# Patient Record
Sex: Female | Born: 1995 | Race: Black or African American | Hispanic: No | Marital: Single | Smoking: Current every day smoker
Health system: Southern US, Community
[De-identification: ages and names within clinical notes are randomized; demographics above are authoritative.]

## PROBLEM LIST (undated history)

## (undated) DIAGNOSIS — D649 Anemia, unspecified: Secondary | ICD-10-CM

## (undated) DIAGNOSIS — N186 End stage renal disease: Secondary | ICD-10-CM

## (undated) HISTORY — PX: DIALYSIS/PERMA CATHETER INSERTION: CATH118288

## (undated) HISTORY — DX: End stage renal disease: N18.6

## (undated) HISTORY — PX: HERNIA REPAIR: SHX51

---
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
## 2016-10-29 DIAGNOSIS — N926 Irregular menstruation, unspecified: Secondary | ICD-10-CM | POA: Insufficient documentation

## 2017-02-18 DIAGNOSIS — N186 End stage renal disease: Secondary | ICD-10-CM | POA: Insufficient documentation

## 2017-03-02 DIAGNOSIS — I10 Essential (primary) hypertension: Secondary | ICD-10-CM | POA: Diagnosis present

## 2017-03-02 DIAGNOSIS — R110 Nausea: Secondary | ICD-10-CM | POA: Diagnosis not present

## 2017-03-02 DIAGNOSIS — N185 Chronic kidney disease, stage 5: Secondary | ICD-10-CM | POA: Diagnosis not present

## 2017-03-02 DIAGNOSIS — D638 Anemia in other chronic diseases classified elsewhere: Secondary | ICD-10-CM | POA: Diagnosis present

## 2017-03-02 DIAGNOSIS — D509 Iron deficiency anemia, unspecified: Secondary | ICD-10-CM | POA: Diagnosis not present

## 2017-03-02 DIAGNOSIS — I120 Hypertensive chronic kidney disease with stage 5 chronic kidney disease or end stage renal disease: Secondary | ICD-10-CM | POA: Diagnosis not present

## 2017-03-02 DIAGNOSIS — N057 Unspecified nephritic syndrome with diffuse crescentic glomerulonephritis: Secondary | ICD-10-CM | POA: Diagnosis present

## 2017-03-02 DIAGNOSIS — N028 Recurrent and persistent hematuria with other morphologic changes: Secondary | ICD-10-CM | POA: Diagnosis present

## 2017-03-02 DIAGNOSIS — Z87891 Personal history of nicotine dependence: Secondary | ICD-10-CM | POA: Diagnosis not present

## 2017-03-02 DIAGNOSIS — N186 End stage renal disease: Secondary | ICD-10-CM | POA: Diagnosis present

## 2017-03-02 DIAGNOSIS — E872 Acidosis: Secondary | ICD-10-CM | POA: Diagnosis not present

## 2017-03-02 DIAGNOSIS — N2581 Secondary hyperparathyroidism of renal origin: Secondary | ICD-10-CM | POA: Diagnosis present

## 2017-03-02 DIAGNOSIS — N19 Unspecified kidney failure: Secondary | ICD-10-CM | POA: Diagnosis not present

## 2017-03-02 DIAGNOSIS — E669 Obesity, unspecified: Secondary | ICD-10-CM | POA: Diagnosis present

## 2017-03-08 DIAGNOSIS — Z4902 Encounter for fitting and adjustment of peritoneal dialysis catheter: Secondary | ICD-10-CM | POA: Diagnosis not present

## 2017-03-08 DIAGNOSIS — N186 End stage renal disease: Secondary | ICD-10-CM | POA: Diagnosis not present

## 2017-03-09 DIAGNOSIS — J45909 Unspecified asthma, uncomplicated: Secondary | ICD-10-CM | POA: Diagnosis not present

## 2017-03-09 DIAGNOSIS — N19 Unspecified kidney failure: Secondary | ICD-10-CM | POA: Diagnosis not present

## 2017-03-09 DIAGNOSIS — K668 Other specified disorders of peritoneum: Secondary | ICD-10-CM | POA: Diagnosis not present

## 2017-03-09 DIAGNOSIS — Z9889 Other specified postprocedural states: Secondary | ICD-10-CM | POA: Diagnosis not present

## 2017-03-09 DIAGNOSIS — F17200 Nicotine dependence, unspecified, uncomplicated: Secondary | ICD-10-CM | POA: Diagnosis not present

## 2017-03-09 DIAGNOSIS — Z79899 Other long term (current) drug therapy: Secondary | ICD-10-CM | POA: Diagnosis not present

## 2017-03-09 DIAGNOSIS — I10 Essential (primary) hypertension: Secondary | ICD-10-CM | POA: Diagnosis not present

## 2017-03-16 DIAGNOSIS — N186 End stage renal disease: Secondary | ICD-10-CM | POA: Diagnosis not present

## 2017-03-16 DIAGNOSIS — N2581 Secondary hyperparathyroidism of renal origin: Secondary | ICD-10-CM | POA: Diagnosis not present

## 2017-03-16 DIAGNOSIS — D631 Anemia in chronic kidney disease: Secondary | ICD-10-CM | POA: Diagnosis not present

## 2017-03-16 DIAGNOSIS — D509 Iron deficiency anemia, unspecified: Secondary | ICD-10-CM | POA: Diagnosis not present

## 2017-03-21 DIAGNOSIS — N2581 Secondary hyperparathyroidism of renal origin: Secondary | ICD-10-CM | POA: Diagnosis not present

## 2017-03-21 DIAGNOSIS — D509 Iron deficiency anemia, unspecified: Secondary | ICD-10-CM | POA: Diagnosis not present

## 2017-03-21 DIAGNOSIS — N186 End stage renal disease: Secondary | ICD-10-CM | POA: Diagnosis not present

## 2017-03-21 DIAGNOSIS — D631 Anemia in chronic kidney disease: Secondary | ICD-10-CM | POA: Diagnosis not present

## 2017-03-22 DIAGNOSIS — D509 Iron deficiency anemia, unspecified: Secondary | ICD-10-CM | POA: Diagnosis not present

## 2017-03-22 DIAGNOSIS — N186 End stage renal disease: Secondary | ICD-10-CM | POA: Diagnosis not present

## 2017-03-22 DIAGNOSIS — D631 Anemia in chronic kidney disease: Secondary | ICD-10-CM | POA: Diagnosis not present

## 2017-03-22 DIAGNOSIS — N2581 Secondary hyperparathyroidism of renal origin: Secondary | ICD-10-CM | POA: Diagnosis not present

## 2017-03-23 DIAGNOSIS — N2581 Secondary hyperparathyroidism of renal origin: Secondary | ICD-10-CM | POA: Diagnosis not present

## 2017-03-23 DIAGNOSIS — D631 Anemia in chronic kidney disease: Secondary | ICD-10-CM | POA: Diagnosis not present

## 2017-03-23 DIAGNOSIS — N186 End stage renal disease: Secondary | ICD-10-CM | POA: Diagnosis not present

## 2017-03-23 DIAGNOSIS — D509 Iron deficiency anemia, unspecified: Secondary | ICD-10-CM | POA: Diagnosis not present

## 2017-03-24 DIAGNOSIS — D509 Iron deficiency anemia, unspecified: Secondary | ICD-10-CM | POA: Diagnosis not present

## 2017-03-24 DIAGNOSIS — N2581 Secondary hyperparathyroidism of renal origin: Secondary | ICD-10-CM | POA: Diagnosis not present

## 2017-03-24 DIAGNOSIS — N186 End stage renal disease: Secondary | ICD-10-CM | POA: Diagnosis not present

## 2017-03-24 DIAGNOSIS — D631 Anemia in chronic kidney disease: Secondary | ICD-10-CM | POA: Diagnosis not present

## 2017-03-25 DIAGNOSIS — N186 End stage renal disease: Secondary | ICD-10-CM | POA: Diagnosis not present

## 2017-03-26 DIAGNOSIS — N186 End stage renal disease: Secondary | ICD-10-CM | POA: Diagnosis not present

## 2017-03-27 DIAGNOSIS — N186 End stage renal disease: Secondary | ICD-10-CM | POA: Diagnosis not present

## 2017-03-28 DIAGNOSIS — N186 End stage renal disease: Secondary | ICD-10-CM | POA: Diagnosis not present

## 2017-03-29 DIAGNOSIS — N186 End stage renal disease: Secondary | ICD-10-CM | POA: Diagnosis not present

## 2017-03-29 DIAGNOSIS — D631 Anemia in chronic kidney disease: Secondary | ICD-10-CM | POA: Diagnosis not present

## 2017-03-30 DIAGNOSIS — Z01818 Encounter for other preprocedural examination: Secondary | ICD-10-CM | POA: Insufficient documentation

## 2017-03-30 DIAGNOSIS — N186 End stage renal disease: Secondary | ICD-10-CM | POA: Diagnosis not present

## 2017-03-31 DIAGNOSIS — N186 End stage renal disease: Secondary | ICD-10-CM | POA: Diagnosis not present

## 2017-04-01 DIAGNOSIS — N186 End stage renal disease: Secondary | ICD-10-CM | POA: Diagnosis not present

## 2017-04-01 DIAGNOSIS — D631 Anemia in chronic kidney disease: Secondary | ICD-10-CM | POA: Diagnosis not present

## 2017-04-01 DIAGNOSIS — N2581 Secondary hyperparathyroidism of renal origin: Secondary | ICD-10-CM | POA: Diagnosis not present

## 2017-04-01 DIAGNOSIS — D509 Iron deficiency anemia, unspecified: Secondary | ICD-10-CM | POA: Diagnosis not present

## 2017-04-02 DIAGNOSIS — N2581 Secondary hyperparathyroidism of renal origin: Secondary | ICD-10-CM | POA: Diagnosis not present

## 2017-04-02 DIAGNOSIS — N186 End stage renal disease: Secondary | ICD-10-CM | POA: Diagnosis not present

## 2017-04-02 DIAGNOSIS — D509 Iron deficiency anemia, unspecified: Secondary | ICD-10-CM | POA: Diagnosis not present

## 2017-04-02 DIAGNOSIS — D631 Anemia in chronic kidney disease: Secondary | ICD-10-CM | POA: Diagnosis not present

## 2017-04-03 DIAGNOSIS — N186 End stage renal disease: Secondary | ICD-10-CM | POA: Diagnosis not present

## 2017-04-03 DIAGNOSIS — D631 Anemia in chronic kidney disease: Secondary | ICD-10-CM | POA: Diagnosis not present

## 2017-04-03 DIAGNOSIS — D509 Iron deficiency anemia, unspecified: Secondary | ICD-10-CM | POA: Diagnosis not present

## 2017-04-03 DIAGNOSIS — N2581 Secondary hyperparathyroidism of renal origin: Secondary | ICD-10-CM | POA: Diagnosis not present

## 2017-04-04 DIAGNOSIS — D509 Iron deficiency anemia, unspecified: Secondary | ICD-10-CM | POA: Diagnosis not present

## 2017-04-04 DIAGNOSIS — D631 Anemia in chronic kidney disease: Secondary | ICD-10-CM | POA: Diagnosis not present

## 2017-04-04 DIAGNOSIS — N186 End stage renal disease: Secondary | ICD-10-CM | POA: Diagnosis not present

## 2017-04-04 DIAGNOSIS — N2581 Secondary hyperparathyroidism of renal origin: Secondary | ICD-10-CM | POA: Diagnosis not present

## 2017-04-05 DIAGNOSIS — N2581 Secondary hyperparathyroidism of renal origin: Secondary | ICD-10-CM | POA: Diagnosis not present

## 2017-04-05 DIAGNOSIS — D509 Iron deficiency anemia, unspecified: Secondary | ICD-10-CM | POA: Diagnosis not present

## 2017-04-05 DIAGNOSIS — N186 End stage renal disease: Secondary | ICD-10-CM | POA: Diagnosis not present

## 2017-04-05 DIAGNOSIS — D631 Anemia in chronic kidney disease: Secondary | ICD-10-CM | POA: Diagnosis not present

## 2017-04-06 DIAGNOSIS — D509 Iron deficiency anemia, unspecified: Secondary | ICD-10-CM | POA: Diagnosis not present

## 2017-04-06 DIAGNOSIS — D631 Anemia in chronic kidney disease: Secondary | ICD-10-CM | POA: Diagnosis not present

## 2017-04-06 DIAGNOSIS — N186 End stage renal disease: Secondary | ICD-10-CM | POA: Diagnosis not present

## 2017-04-06 DIAGNOSIS — N2581 Secondary hyperparathyroidism of renal origin: Secondary | ICD-10-CM | POA: Diagnosis not present

## 2017-04-07 DIAGNOSIS — N186 End stage renal disease: Secondary | ICD-10-CM | POA: Diagnosis not present

## 2017-04-07 DIAGNOSIS — N2581 Secondary hyperparathyroidism of renal origin: Secondary | ICD-10-CM | POA: Diagnosis not present

## 2017-04-07 DIAGNOSIS — N764 Abscess of vulva: Secondary | ICD-10-CM | POA: Diagnosis not present

## 2017-04-07 DIAGNOSIS — D509 Iron deficiency anemia, unspecified: Secondary | ICD-10-CM | POA: Diagnosis not present

## 2017-04-07 DIAGNOSIS — D631 Anemia in chronic kidney disease: Secondary | ICD-10-CM | POA: Diagnosis not present

## 2017-04-08 DIAGNOSIS — D509 Iron deficiency anemia, unspecified: Secondary | ICD-10-CM | POA: Diagnosis not present

## 2017-04-08 DIAGNOSIS — D631 Anemia in chronic kidney disease: Secondary | ICD-10-CM | POA: Diagnosis not present

## 2017-04-08 DIAGNOSIS — N186 End stage renal disease: Secondary | ICD-10-CM | POA: Diagnosis not present

## 2017-04-08 DIAGNOSIS — N2581 Secondary hyperparathyroidism of renal origin: Secondary | ICD-10-CM | POA: Diagnosis not present

## 2017-04-09 DIAGNOSIS — D509 Iron deficiency anemia, unspecified: Secondary | ICD-10-CM | POA: Diagnosis not present

## 2017-04-09 DIAGNOSIS — D631 Anemia in chronic kidney disease: Secondary | ICD-10-CM | POA: Diagnosis not present

## 2017-04-09 DIAGNOSIS — N2581 Secondary hyperparathyroidism of renal origin: Secondary | ICD-10-CM | POA: Diagnosis not present

## 2017-04-09 DIAGNOSIS — N186 End stage renal disease: Secondary | ICD-10-CM | POA: Diagnosis not present

## 2017-04-10 DIAGNOSIS — D509 Iron deficiency anemia, unspecified: Secondary | ICD-10-CM | POA: Diagnosis not present

## 2017-04-10 DIAGNOSIS — N2581 Secondary hyperparathyroidism of renal origin: Secondary | ICD-10-CM | POA: Diagnosis not present

## 2017-04-10 DIAGNOSIS — D631 Anemia in chronic kidney disease: Secondary | ICD-10-CM | POA: Diagnosis not present

## 2017-04-10 DIAGNOSIS — N186 End stage renal disease: Secondary | ICD-10-CM | POA: Diagnosis not present

## 2017-04-11 DIAGNOSIS — D509 Iron deficiency anemia, unspecified: Secondary | ICD-10-CM | POA: Diagnosis not present

## 2017-04-11 DIAGNOSIS — N186 End stage renal disease: Secondary | ICD-10-CM | POA: Diagnosis not present

## 2017-04-11 DIAGNOSIS — N2581 Secondary hyperparathyroidism of renal origin: Secondary | ICD-10-CM | POA: Diagnosis not present

## 2017-04-11 DIAGNOSIS — D631 Anemia in chronic kidney disease: Secondary | ICD-10-CM | POA: Diagnosis not present

## 2017-04-12 DIAGNOSIS — N2581 Secondary hyperparathyroidism of renal origin: Secondary | ICD-10-CM | POA: Diagnosis not present

## 2017-04-12 DIAGNOSIS — D509 Iron deficiency anemia, unspecified: Secondary | ICD-10-CM | POA: Diagnosis not present

## 2017-04-12 DIAGNOSIS — N186 End stage renal disease: Secondary | ICD-10-CM | POA: Diagnosis not present

## 2017-04-12 DIAGNOSIS — D631 Anemia in chronic kidney disease: Secondary | ICD-10-CM | POA: Diagnosis not present

## 2017-04-13 DIAGNOSIS — N186 End stage renal disease: Secondary | ICD-10-CM | POA: Diagnosis not present

## 2017-04-13 DIAGNOSIS — D631 Anemia in chronic kidney disease: Secondary | ICD-10-CM | POA: Diagnosis not present

## 2017-04-13 DIAGNOSIS — D509 Iron deficiency anemia, unspecified: Secondary | ICD-10-CM | POA: Diagnosis not present

## 2017-04-13 DIAGNOSIS — N2581 Secondary hyperparathyroidism of renal origin: Secondary | ICD-10-CM | POA: Diagnosis not present

## 2017-04-14 DIAGNOSIS — D509 Iron deficiency anemia, unspecified: Secondary | ICD-10-CM | POA: Diagnosis not present

## 2017-04-14 DIAGNOSIS — N186 End stage renal disease: Secondary | ICD-10-CM | POA: Diagnosis not present

## 2017-04-14 DIAGNOSIS — D631 Anemia in chronic kidney disease: Secondary | ICD-10-CM | POA: Diagnosis not present

## 2017-04-14 DIAGNOSIS — N2581 Secondary hyperparathyroidism of renal origin: Secondary | ICD-10-CM | POA: Diagnosis not present

## 2017-04-15 DIAGNOSIS — D631 Anemia in chronic kidney disease: Secondary | ICD-10-CM | POA: Diagnosis not present

## 2017-04-15 DIAGNOSIS — N2581 Secondary hyperparathyroidism of renal origin: Secondary | ICD-10-CM | POA: Diagnosis not present

## 2017-04-15 DIAGNOSIS — N186 End stage renal disease: Secondary | ICD-10-CM | POA: Diagnosis not present

## 2017-04-15 DIAGNOSIS — D509 Iron deficiency anemia, unspecified: Secondary | ICD-10-CM | POA: Diagnosis not present

## 2017-04-16 DIAGNOSIS — D509 Iron deficiency anemia, unspecified: Secondary | ICD-10-CM | POA: Diagnosis not present

## 2017-04-16 DIAGNOSIS — N2581 Secondary hyperparathyroidism of renal origin: Secondary | ICD-10-CM | POA: Diagnosis not present

## 2017-04-16 DIAGNOSIS — N186 End stage renal disease: Secondary | ICD-10-CM | POA: Diagnosis not present

## 2017-04-16 DIAGNOSIS — D631 Anemia in chronic kidney disease: Secondary | ICD-10-CM | POA: Diagnosis not present

## 2017-04-17 DIAGNOSIS — N186 End stage renal disease: Secondary | ICD-10-CM | POA: Diagnosis not present

## 2017-04-17 DIAGNOSIS — D631 Anemia in chronic kidney disease: Secondary | ICD-10-CM | POA: Diagnosis not present

## 2017-04-17 DIAGNOSIS — D509 Iron deficiency anemia, unspecified: Secondary | ICD-10-CM | POA: Diagnosis not present

## 2017-04-17 DIAGNOSIS — N2581 Secondary hyperparathyroidism of renal origin: Secondary | ICD-10-CM | POA: Diagnosis not present

## 2017-04-18 DIAGNOSIS — D509 Iron deficiency anemia, unspecified: Secondary | ICD-10-CM | POA: Diagnosis not present

## 2017-04-18 DIAGNOSIS — D631 Anemia in chronic kidney disease: Secondary | ICD-10-CM | POA: Diagnosis not present

## 2017-04-18 DIAGNOSIS — N186 End stage renal disease: Secondary | ICD-10-CM | POA: Diagnosis not present

## 2017-04-18 DIAGNOSIS — N2581 Secondary hyperparathyroidism of renal origin: Secondary | ICD-10-CM | POA: Diagnosis not present

## 2017-04-19 DIAGNOSIS — D631 Anemia in chronic kidney disease: Secondary | ICD-10-CM | POA: Diagnosis not present

## 2017-04-19 DIAGNOSIS — N2581 Secondary hyperparathyroidism of renal origin: Secondary | ICD-10-CM | POA: Diagnosis not present

## 2017-04-19 DIAGNOSIS — D509 Iron deficiency anemia, unspecified: Secondary | ICD-10-CM | POA: Diagnosis not present

## 2017-04-19 DIAGNOSIS — N186 End stage renal disease: Secondary | ICD-10-CM | POA: Diagnosis not present

## 2017-04-20 DIAGNOSIS — D631 Anemia in chronic kidney disease: Secondary | ICD-10-CM | POA: Diagnosis not present

## 2017-04-20 DIAGNOSIS — D509 Iron deficiency anemia, unspecified: Secondary | ICD-10-CM | POA: Diagnosis not present

## 2017-04-20 DIAGNOSIS — N2581 Secondary hyperparathyroidism of renal origin: Secondary | ICD-10-CM | POA: Diagnosis not present

## 2017-04-20 DIAGNOSIS — N186 End stage renal disease: Secondary | ICD-10-CM | POA: Diagnosis not present

## 2017-04-21 DIAGNOSIS — N186 End stage renal disease: Secondary | ICD-10-CM | POA: Diagnosis not present

## 2017-04-21 DIAGNOSIS — N2581 Secondary hyperparathyroidism of renal origin: Secondary | ICD-10-CM | POA: Diagnosis not present

## 2017-04-21 DIAGNOSIS — D631 Anemia in chronic kidney disease: Secondary | ICD-10-CM | POA: Diagnosis not present

## 2017-04-21 DIAGNOSIS — D509 Iron deficiency anemia, unspecified: Secondary | ICD-10-CM | POA: Diagnosis not present

## 2017-04-22 DIAGNOSIS — N2581 Secondary hyperparathyroidism of renal origin: Secondary | ICD-10-CM | POA: Diagnosis not present

## 2017-04-22 DIAGNOSIS — D509 Iron deficiency anemia, unspecified: Secondary | ICD-10-CM | POA: Diagnosis not present

## 2017-04-22 DIAGNOSIS — N186 End stage renal disease: Secondary | ICD-10-CM | POA: Diagnosis not present

## 2017-04-22 DIAGNOSIS — D631 Anemia in chronic kidney disease: Secondary | ICD-10-CM | POA: Diagnosis not present

## 2017-04-23 DIAGNOSIS — N2581 Secondary hyperparathyroidism of renal origin: Secondary | ICD-10-CM | POA: Diagnosis not present

## 2017-04-23 DIAGNOSIS — N186 End stage renal disease: Secondary | ICD-10-CM | POA: Diagnosis not present

## 2017-04-23 DIAGNOSIS — D509 Iron deficiency anemia, unspecified: Secondary | ICD-10-CM | POA: Diagnosis not present

## 2017-04-23 DIAGNOSIS — D631 Anemia in chronic kidney disease: Secondary | ICD-10-CM | POA: Diagnosis not present

## 2017-04-24 DIAGNOSIS — D509 Iron deficiency anemia, unspecified: Secondary | ICD-10-CM | POA: Diagnosis not present

## 2017-04-24 DIAGNOSIS — N2581 Secondary hyperparathyroidism of renal origin: Secondary | ICD-10-CM | POA: Diagnosis not present

## 2017-04-24 DIAGNOSIS — N186 End stage renal disease: Secondary | ICD-10-CM | POA: Diagnosis not present

## 2017-04-24 DIAGNOSIS — D631 Anemia in chronic kidney disease: Secondary | ICD-10-CM | POA: Diagnosis not present

## 2017-04-25 DIAGNOSIS — D631 Anemia in chronic kidney disease: Secondary | ICD-10-CM | POA: Diagnosis not present

## 2017-04-25 DIAGNOSIS — N2581 Secondary hyperparathyroidism of renal origin: Secondary | ICD-10-CM | POA: Diagnosis not present

## 2017-04-25 DIAGNOSIS — N186 End stage renal disease: Secondary | ICD-10-CM | POA: Diagnosis not present

## 2017-04-25 DIAGNOSIS — D509 Iron deficiency anemia, unspecified: Secondary | ICD-10-CM | POA: Diagnosis not present

## 2017-04-26 DIAGNOSIS — D509 Iron deficiency anemia, unspecified: Secondary | ICD-10-CM | POA: Diagnosis not present

## 2017-04-26 DIAGNOSIS — D631 Anemia in chronic kidney disease: Secondary | ICD-10-CM | POA: Diagnosis not present

## 2017-04-26 DIAGNOSIS — N186 End stage renal disease: Secondary | ICD-10-CM | POA: Diagnosis not present

## 2017-04-26 DIAGNOSIS — N2581 Secondary hyperparathyroidism of renal origin: Secondary | ICD-10-CM | POA: Diagnosis not present

## 2017-04-27 DIAGNOSIS — D509 Iron deficiency anemia, unspecified: Secondary | ICD-10-CM | POA: Diagnosis not present

## 2017-04-27 DIAGNOSIS — N2581 Secondary hyperparathyroidism of renal origin: Secondary | ICD-10-CM | POA: Diagnosis not present

## 2017-04-27 DIAGNOSIS — D631 Anemia in chronic kidney disease: Secondary | ICD-10-CM | POA: Diagnosis not present

## 2017-04-27 DIAGNOSIS — N186 End stage renal disease: Secondary | ICD-10-CM | POA: Diagnosis not present

## 2017-04-28 DIAGNOSIS — D509 Iron deficiency anemia, unspecified: Secondary | ICD-10-CM | POA: Diagnosis not present

## 2017-04-28 DIAGNOSIS — Z992 Dependence on renal dialysis: Secondary | ICD-10-CM | POA: Diagnosis not present

## 2017-04-28 DIAGNOSIS — D631 Anemia in chronic kidney disease: Secondary | ICD-10-CM | POA: Diagnosis not present

## 2017-04-28 DIAGNOSIS — N186 End stage renal disease: Secondary | ICD-10-CM | POA: Diagnosis not present

## 2017-04-28 DIAGNOSIS — N2581 Secondary hyperparathyroidism of renal origin: Secondary | ICD-10-CM | POA: Diagnosis not present

## 2017-04-29 DIAGNOSIS — N2581 Secondary hyperparathyroidism of renal origin: Secondary | ICD-10-CM | POA: Diagnosis not present

## 2017-04-29 DIAGNOSIS — D509 Iron deficiency anemia, unspecified: Secondary | ICD-10-CM | POA: Diagnosis not present

## 2017-04-29 DIAGNOSIS — N186 End stage renal disease: Secondary | ICD-10-CM | POA: Diagnosis not present

## 2017-04-29 DIAGNOSIS — D631 Anemia in chronic kidney disease: Secondary | ICD-10-CM | POA: Diagnosis not present

## 2017-04-30 DIAGNOSIS — N186 End stage renal disease: Secondary | ICD-10-CM | POA: Diagnosis not present

## 2017-05-01 DIAGNOSIS — N2581 Secondary hyperparathyroidism of renal origin: Secondary | ICD-10-CM | POA: Diagnosis not present

## 2017-05-01 DIAGNOSIS — D631 Anemia in chronic kidney disease: Secondary | ICD-10-CM | POA: Diagnosis not present

## 2017-05-01 DIAGNOSIS — N186 End stage renal disease: Secondary | ICD-10-CM | POA: Diagnosis not present

## 2017-05-01 DIAGNOSIS — D509 Iron deficiency anemia, unspecified: Secondary | ICD-10-CM | POA: Diagnosis not present

## 2017-05-02 DIAGNOSIS — N2581 Secondary hyperparathyroidism of renal origin: Secondary | ICD-10-CM | POA: Diagnosis not present

## 2017-05-02 DIAGNOSIS — D509 Iron deficiency anemia, unspecified: Secondary | ICD-10-CM | POA: Diagnosis not present

## 2017-05-02 DIAGNOSIS — D631 Anemia in chronic kidney disease: Secondary | ICD-10-CM | POA: Diagnosis not present

## 2017-05-02 DIAGNOSIS — N186 End stage renal disease: Secondary | ICD-10-CM | POA: Diagnosis not present

## 2017-05-03 DIAGNOSIS — D631 Anemia in chronic kidney disease: Secondary | ICD-10-CM | POA: Diagnosis not present

## 2017-05-03 DIAGNOSIS — N186 End stage renal disease: Secondary | ICD-10-CM | POA: Diagnosis not present

## 2017-05-03 DIAGNOSIS — N2581 Secondary hyperparathyroidism of renal origin: Secondary | ICD-10-CM | POA: Diagnosis not present

## 2017-05-03 DIAGNOSIS — D509 Iron deficiency anemia, unspecified: Secondary | ICD-10-CM | POA: Diagnosis not present

## 2017-05-04 DIAGNOSIS — D631 Anemia in chronic kidney disease: Secondary | ICD-10-CM | POA: Diagnosis not present

## 2017-05-04 DIAGNOSIS — N186 End stage renal disease: Secondary | ICD-10-CM | POA: Diagnosis not present

## 2017-05-04 DIAGNOSIS — D509 Iron deficiency anemia, unspecified: Secondary | ICD-10-CM | POA: Diagnosis not present

## 2017-05-04 DIAGNOSIS — N2581 Secondary hyperparathyroidism of renal origin: Secondary | ICD-10-CM | POA: Diagnosis not present

## 2017-05-05 DIAGNOSIS — N2581 Secondary hyperparathyroidism of renal origin: Secondary | ICD-10-CM | POA: Diagnosis not present

## 2017-05-05 DIAGNOSIS — D509 Iron deficiency anemia, unspecified: Secondary | ICD-10-CM | POA: Diagnosis not present

## 2017-05-05 DIAGNOSIS — N186 End stage renal disease: Secondary | ICD-10-CM | POA: Diagnosis not present

## 2017-05-05 DIAGNOSIS — D631 Anemia in chronic kidney disease: Secondary | ICD-10-CM | POA: Diagnosis not present

## 2017-05-06 DIAGNOSIS — D631 Anemia in chronic kidney disease: Secondary | ICD-10-CM | POA: Diagnosis not present

## 2017-05-06 DIAGNOSIS — D509 Iron deficiency anemia, unspecified: Secondary | ICD-10-CM | POA: Diagnosis not present

## 2017-05-06 DIAGNOSIS — N2581 Secondary hyperparathyroidism of renal origin: Secondary | ICD-10-CM | POA: Diagnosis not present

## 2017-05-06 DIAGNOSIS — N186 End stage renal disease: Secondary | ICD-10-CM | POA: Diagnosis not present

## 2017-05-07 DIAGNOSIS — N2581 Secondary hyperparathyroidism of renal origin: Secondary | ICD-10-CM | POA: Diagnosis not present

## 2017-05-07 DIAGNOSIS — D631 Anemia in chronic kidney disease: Secondary | ICD-10-CM | POA: Diagnosis not present

## 2017-05-07 DIAGNOSIS — D509 Iron deficiency anemia, unspecified: Secondary | ICD-10-CM | POA: Diagnosis not present

## 2017-05-07 DIAGNOSIS — N186 End stage renal disease: Secondary | ICD-10-CM | POA: Diagnosis not present

## 2017-05-08 DIAGNOSIS — N186 End stage renal disease: Secondary | ICD-10-CM | POA: Diagnosis not present

## 2017-05-08 DIAGNOSIS — N2581 Secondary hyperparathyroidism of renal origin: Secondary | ICD-10-CM | POA: Diagnosis not present

## 2017-05-08 DIAGNOSIS — D631 Anemia in chronic kidney disease: Secondary | ICD-10-CM | POA: Diagnosis not present

## 2017-05-08 DIAGNOSIS — D509 Iron deficiency anemia, unspecified: Secondary | ICD-10-CM | POA: Diagnosis not present

## 2017-05-09 DIAGNOSIS — D509 Iron deficiency anemia, unspecified: Secondary | ICD-10-CM | POA: Diagnosis not present

## 2017-05-09 DIAGNOSIS — D631 Anemia in chronic kidney disease: Secondary | ICD-10-CM | POA: Diagnosis not present

## 2017-05-09 DIAGNOSIS — N2581 Secondary hyperparathyroidism of renal origin: Secondary | ICD-10-CM | POA: Diagnosis not present

## 2017-05-09 DIAGNOSIS — N186 End stage renal disease: Secondary | ICD-10-CM | POA: Diagnosis not present

## 2017-05-10 DIAGNOSIS — N186 End stage renal disease: Secondary | ICD-10-CM | POA: Diagnosis not present

## 2017-05-11 DIAGNOSIS — N186 End stage renal disease: Secondary | ICD-10-CM | POA: Diagnosis not present

## 2017-05-12 DIAGNOSIS — D509 Iron deficiency anemia, unspecified: Secondary | ICD-10-CM | POA: Diagnosis not present

## 2017-05-12 DIAGNOSIS — D631 Anemia in chronic kidney disease: Secondary | ICD-10-CM | POA: Diagnosis not present

## 2017-05-12 DIAGNOSIS — N2581 Secondary hyperparathyroidism of renal origin: Secondary | ICD-10-CM | POA: Diagnosis not present

## 2017-05-12 DIAGNOSIS — N186 End stage renal disease: Secondary | ICD-10-CM | POA: Diagnosis not present

## 2017-05-13 DIAGNOSIS — D631 Anemia in chronic kidney disease: Secondary | ICD-10-CM | POA: Diagnosis not present

## 2017-05-13 DIAGNOSIS — D509 Iron deficiency anemia, unspecified: Secondary | ICD-10-CM | POA: Diagnosis not present

## 2017-05-13 DIAGNOSIS — N186 End stage renal disease: Secondary | ICD-10-CM | POA: Diagnosis not present

## 2017-05-13 DIAGNOSIS — N2581 Secondary hyperparathyroidism of renal origin: Secondary | ICD-10-CM | POA: Diagnosis not present

## 2017-05-14 DIAGNOSIS — N2581 Secondary hyperparathyroidism of renal origin: Secondary | ICD-10-CM | POA: Diagnosis not present

## 2017-05-14 DIAGNOSIS — D631 Anemia in chronic kidney disease: Secondary | ICD-10-CM | POA: Diagnosis not present

## 2017-05-14 DIAGNOSIS — D509 Iron deficiency anemia, unspecified: Secondary | ICD-10-CM | POA: Diagnosis not present

## 2017-05-14 DIAGNOSIS — N186 End stage renal disease: Secondary | ICD-10-CM | POA: Diagnosis not present

## 2017-05-15 DIAGNOSIS — D509 Iron deficiency anemia, unspecified: Secondary | ICD-10-CM | POA: Diagnosis not present

## 2017-05-15 DIAGNOSIS — D631 Anemia in chronic kidney disease: Secondary | ICD-10-CM | POA: Diagnosis not present

## 2017-05-15 DIAGNOSIS — N2581 Secondary hyperparathyroidism of renal origin: Secondary | ICD-10-CM | POA: Diagnosis not present

## 2017-05-15 DIAGNOSIS — N186 End stage renal disease: Secondary | ICD-10-CM | POA: Diagnosis not present

## 2017-05-16 DIAGNOSIS — D509 Iron deficiency anemia, unspecified: Secondary | ICD-10-CM | POA: Diagnosis not present

## 2017-05-16 DIAGNOSIS — D631 Anemia in chronic kidney disease: Secondary | ICD-10-CM | POA: Diagnosis not present

## 2017-05-16 DIAGNOSIS — N2581 Secondary hyperparathyroidism of renal origin: Secondary | ICD-10-CM | POA: Diagnosis not present

## 2017-05-16 DIAGNOSIS — N186 End stage renal disease: Secondary | ICD-10-CM | POA: Diagnosis not present

## 2017-05-17 DIAGNOSIS — N2581 Secondary hyperparathyroidism of renal origin: Secondary | ICD-10-CM | POA: Diagnosis not present

## 2017-05-17 DIAGNOSIS — D509 Iron deficiency anemia, unspecified: Secondary | ICD-10-CM | POA: Diagnosis not present

## 2017-05-17 DIAGNOSIS — N186 End stage renal disease: Secondary | ICD-10-CM | POA: Diagnosis not present

## 2017-05-17 DIAGNOSIS — D631 Anemia in chronic kidney disease: Secondary | ICD-10-CM | POA: Diagnosis not present

## 2017-05-18 DIAGNOSIS — N2581 Secondary hyperparathyroidism of renal origin: Secondary | ICD-10-CM | POA: Diagnosis not present

## 2017-05-18 DIAGNOSIS — D509 Iron deficiency anemia, unspecified: Secondary | ICD-10-CM | POA: Diagnosis not present

## 2017-05-18 DIAGNOSIS — N186 End stage renal disease: Secondary | ICD-10-CM | POA: Diagnosis not present

## 2017-05-18 DIAGNOSIS — D631 Anemia in chronic kidney disease: Secondary | ICD-10-CM | POA: Diagnosis not present

## 2017-05-19 DIAGNOSIS — N186 End stage renal disease: Secondary | ICD-10-CM | POA: Diagnosis not present

## 2017-05-19 DIAGNOSIS — N2581 Secondary hyperparathyroidism of renal origin: Secondary | ICD-10-CM | POA: Diagnosis not present

## 2017-05-19 DIAGNOSIS — D631 Anemia in chronic kidney disease: Secondary | ICD-10-CM | POA: Diagnosis not present

## 2017-05-19 DIAGNOSIS — D509 Iron deficiency anemia, unspecified: Secondary | ICD-10-CM | POA: Diagnosis not present

## 2017-05-20 DIAGNOSIS — D509 Iron deficiency anemia, unspecified: Secondary | ICD-10-CM | POA: Diagnosis not present

## 2017-05-20 DIAGNOSIS — N2581 Secondary hyperparathyroidism of renal origin: Secondary | ICD-10-CM | POA: Diagnosis not present

## 2017-05-20 DIAGNOSIS — D631 Anemia in chronic kidney disease: Secondary | ICD-10-CM | POA: Diagnosis not present

## 2017-05-20 DIAGNOSIS — N186 End stage renal disease: Secondary | ICD-10-CM | POA: Diagnosis not present

## 2017-05-21 DIAGNOSIS — N2581 Secondary hyperparathyroidism of renal origin: Secondary | ICD-10-CM | POA: Diagnosis not present

## 2017-05-21 DIAGNOSIS — N186 End stage renal disease: Secondary | ICD-10-CM | POA: Diagnosis not present

## 2017-05-21 DIAGNOSIS — D631 Anemia in chronic kidney disease: Secondary | ICD-10-CM | POA: Diagnosis not present

## 2017-05-21 DIAGNOSIS — D509 Iron deficiency anemia, unspecified: Secondary | ICD-10-CM | POA: Diagnosis not present

## 2017-05-22 DIAGNOSIS — D631 Anemia in chronic kidney disease: Secondary | ICD-10-CM | POA: Diagnosis not present

## 2017-05-22 DIAGNOSIS — N2581 Secondary hyperparathyroidism of renal origin: Secondary | ICD-10-CM | POA: Diagnosis not present

## 2017-05-22 DIAGNOSIS — N186 End stage renal disease: Secondary | ICD-10-CM | POA: Diagnosis not present

## 2017-05-22 DIAGNOSIS — D509 Iron deficiency anemia, unspecified: Secondary | ICD-10-CM | POA: Diagnosis not present

## 2017-05-24 DIAGNOSIS — D509 Iron deficiency anemia, unspecified: Secondary | ICD-10-CM | POA: Diagnosis not present

## 2017-05-24 DIAGNOSIS — N186 End stage renal disease: Secondary | ICD-10-CM | POA: Diagnosis not present

## 2017-05-24 DIAGNOSIS — N2581 Secondary hyperparathyroidism of renal origin: Secondary | ICD-10-CM | POA: Diagnosis not present

## 2017-05-24 DIAGNOSIS — D631 Anemia in chronic kidney disease: Secondary | ICD-10-CM | POA: Diagnosis not present

## 2017-05-25 DIAGNOSIS — D509 Iron deficiency anemia, unspecified: Secondary | ICD-10-CM | POA: Diagnosis not present

## 2017-05-25 DIAGNOSIS — N186 End stage renal disease: Secondary | ICD-10-CM | POA: Diagnosis not present

## 2017-05-25 DIAGNOSIS — D631 Anemia in chronic kidney disease: Secondary | ICD-10-CM | POA: Diagnosis not present

## 2017-05-25 DIAGNOSIS — N2581 Secondary hyperparathyroidism of renal origin: Secondary | ICD-10-CM | POA: Diagnosis not present

## 2017-05-26 DIAGNOSIS — D509 Iron deficiency anemia, unspecified: Secondary | ICD-10-CM | POA: Diagnosis not present

## 2017-05-26 DIAGNOSIS — D631 Anemia in chronic kidney disease: Secondary | ICD-10-CM | POA: Diagnosis not present

## 2017-05-26 DIAGNOSIS — N2581 Secondary hyperparathyroidism of renal origin: Secondary | ICD-10-CM | POA: Diagnosis not present

## 2017-05-26 DIAGNOSIS — N186 End stage renal disease: Secondary | ICD-10-CM | POA: Diagnosis not present

## 2017-05-27 DIAGNOSIS — N186 End stage renal disease: Secondary | ICD-10-CM | POA: Diagnosis not present

## 2017-05-27 DIAGNOSIS — D631 Anemia in chronic kidney disease: Secondary | ICD-10-CM | POA: Diagnosis not present

## 2017-05-27 DIAGNOSIS — D509 Iron deficiency anemia, unspecified: Secondary | ICD-10-CM | POA: Diagnosis not present

## 2017-05-27 DIAGNOSIS — N2581 Secondary hyperparathyroidism of renal origin: Secondary | ICD-10-CM | POA: Diagnosis not present

## 2017-05-28 DIAGNOSIS — N186 End stage renal disease: Secondary | ICD-10-CM | POA: Diagnosis not present

## 2017-05-28 DIAGNOSIS — D509 Iron deficiency anemia, unspecified: Secondary | ICD-10-CM | POA: Diagnosis not present

## 2017-05-28 DIAGNOSIS — N2581 Secondary hyperparathyroidism of renal origin: Secondary | ICD-10-CM | POA: Diagnosis not present

## 2017-05-28 DIAGNOSIS — D631 Anemia in chronic kidney disease: Secondary | ICD-10-CM | POA: Diagnosis not present

## 2017-05-29 DIAGNOSIS — N2581 Secondary hyperparathyroidism of renal origin: Secondary | ICD-10-CM | POA: Diagnosis not present

## 2017-05-29 DIAGNOSIS — D631 Anemia in chronic kidney disease: Secondary | ICD-10-CM | POA: Diagnosis not present

## 2017-05-29 DIAGNOSIS — Z992 Dependence on renal dialysis: Secondary | ICD-10-CM | POA: Diagnosis not present

## 2017-05-29 DIAGNOSIS — D509 Iron deficiency anemia, unspecified: Secondary | ICD-10-CM | POA: Diagnosis not present

## 2017-05-29 DIAGNOSIS — N186 End stage renal disease: Secondary | ICD-10-CM | POA: Diagnosis not present

## 2017-05-30 DIAGNOSIS — N186 End stage renal disease: Secondary | ICD-10-CM | POA: Diagnosis not present

## 2017-05-30 DIAGNOSIS — D509 Iron deficiency anemia, unspecified: Secondary | ICD-10-CM | POA: Diagnosis not present

## 2017-05-30 DIAGNOSIS — D631 Anemia in chronic kidney disease: Secondary | ICD-10-CM | POA: Diagnosis not present

## 2017-05-30 DIAGNOSIS — Z992 Dependence on renal dialysis: Secondary | ICD-10-CM | POA: Diagnosis not present

## 2017-05-30 DIAGNOSIS — N2581 Secondary hyperparathyroidism of renal origin: Secondary | ICD-10-CM | POA: Diagnosis not present

## 2017-05-31 DIAGNOSIS — D509 Iron deficiency anemia, unspecified: Secondary | ICD-10-CM | POA: Diagnosis not present

## 2017-05-31 DIAGNOSIS — N186 End stage renal disease: Secondary | ICD-10-CM | POA: Diagnosis not present

## 2017-05-31 DIAGNOSIS — N2581 Secondary hyperparathyroidism of renal origin: Secondary | ICD-10-CM | POA: Diagnosis not present

## 2017-05-31 DIAGNOSIS — D631 Anemia in chronic kidney disease: Secondary | ICD-10-CM | POA: Diagnosis not present

## 2017-05-31 DIAGNOSIS — Z992 Dependence on renal dialysis: Secondary | ICD-10-CM | POA: Diagnosis not present

## 2017-06-01 DIAGNOSIS — D631 Anemia in chronic kidney disease: Secondary | ICD-10-CM | POA: Diagnosis not present

## 2017-06-01 DIAGNOSIS — N186 End stage renal disease: Secondary | ICD-10-CM | POA: Diagnosis not present

## 2017-06-01 DIAGNOSIS — Z992 Dependence on renal dialysis: Secondary | ICD-10-CM | POA: Diagnosis not present

## 2017-06-01 DIAGNOSIS — D509 Iron deficiency anemia, unspecified: Secondary | ICD-10-CM | POA: Diagnosis not present

## 2017-06-01 DIAGNOSIS — N2581 Secondary hyperparathyroidism of renal origin: Secondary | ICD-10-CM | POA: Diagnosis not present

## 2017-06-02 DIAGNOSIS — Z4932 Encounter for adequacy testing for peritoneal dialysis: Secondary | ICD-10-CM | POA: Diagnosis not present

## 2017-06-02 DIAGNOSIS — N2581 Secondary hyperparathyroidism of renal origin: Secondary | ICD-10-CM | POA: Diagnosis not present

## 2017-06-02 DIAGNOSIS — D509 Iron deficiency anemia, unspecified: Secondary | ICD-10-CM | POA: Diagnosis not present

## 2017-06-02 DIAGNOSIS — Z992 Dependence on renal dialysis: Secondary | ICD-10-CM | POA: Diagnosis not present

## 2017-06-02 DIAGNOSIS — D631 Anemia in chronic kidney disease: Secondary | ICD-10-CM | POA: Diagnosis not present

## 2017-06-02 DIAGNOSIS — N186 End stage renal disease: Secondary | ICD-10-CM | POA: Diagnosis not present

## 2017-06-03 DIAGNOSIS — D631 Anemia in chronic kidney disease: Secondary | ICD-10-CM | POA: Diagnosis not present

## 2017-06-03 DIAGNOSIS — Z992 Dependence on renal dialysis: Secondary | ICD-10-CM | POA: Diagnosis not present

## 2017-06-03 DIAGNOSIS — D509 Iron deficiency anemia, unspecified: Secondary | ICD-10-CM | POA: Diagnosis not present

## 2017-06-03 DIAGNOSIS — N2581 Secondary hyperparathyroidism of renal origin: Secondary | ICD-10-CM | POA: Diagnosis not present

## 2017-06-03 DIAGNOSIS — N186 End stage renal disease: Secondary | ICD-10-CM | POA: Diagnosis not present

## 2017-06-04 DIAGNOSIS — D509 Iron deficiency anemia, unspecified: Secondary | ICD-10-CM | POA: Diagnosis not present

## 2017-06-04 DIAGNOSIS — D631 Anemia in chronic kidney disease: Secondary | ICD-10-CM | POA: Diagnosis not present

## 2017-06-04 DIAGNOSIS — N2581 Secondary hyperparathyroidism of renal origin: Secondary | ICD-10-CM | POA: Diagnosis not present

## 2017-06-04 DIAGNOSIS — Z992 Dependence on renal dialysis: Secondary | ICD-10-CM | POA: Diagnosis not present

## 2017-06-04 DIAGNOSIS — N186 End stage renal disease: Secondary | ICD-10-CM | POA: Diagnosis not present

## 2017-06-05 DIAGNOSIS — D631 Anemia in chronic kidney disease: Secondary | ICD-10-CM | POA: Diagnosis not present

## 2017-06-05 DIAGNOSIS — N186 End stage renal disease: Secondary | ICD-10-CM | POA: Diagnosis not present

## 2017-06-05 DIAGNOSIS — D509 Iron deficiency anemia, unspecified: Secondary | ICD-10-CM | POA: Diagnosis not present

## 2017-06-05 DIAGNOSIS — N2581 Secondary hyperparathyroidism of renal origin: Secondary | ICD-10-CM | POA: Diagnosis not present

## 2017-06-05 DIAGNOSIS — Z992 Dependence on renal dialysis: Secondary | ICD-10-CM | POA: Diagnosis not present

## 2017-06-06 DIAGNOSIS — N186 End stage renal disease: Secondary | ICD-10-CM | POA: Diagnosis not present

## 2017-06-06 DIAGNOSIS — D631 Anemia in chronic kidney disease: Secondary | ICD-10-CM | POA: Diagnosis not present

## 2017-06-06 DIAGNOSIS — Z992 Dependence on renal dialysis: Secondary | ICD-10-CM | POA: Diagnosis not present

## 2017-06-06 DIAGNOSIS — D509 Iron deficiency anemia, unspecified: Secondary | ICD-10-CM | POA: Diagnosis not present

## 2017-06-06 DIAGNOSIS — N2581 Secondary hyperparathyroidism of renal origin: Secondary | ICD-10-CM | POA: Diagnosis not present

## 2017-06-07 DIAGNOSIS — N186 End stage renal disease: Secondary | ICD-10-CM | POA: Diagnosis not present

## 2017-06-07 DIAGNOSIS — D631 Anemia in chronic kidney disease: Secondary | ICD-10-CM | POA: Diagnosis not present

## 2017-06-07 DIAGNOSIS — N2581 Secondary hyperparathyroidism of renal origin: Secondary | ICD-10-CM | POA: Diagnosis not present

## 2017-06-07 DIAGNOSIS — D509 Iron deficiency anemia, unspecified: Secondary | ICD-10-CM | POA: Diagnosis not present

## 2017-06-07 DIAGNOSIS — Z992 Dependence on renal dialysis: Secondary | ICD-10-CM | POA: Diagnosis not present

## 2017-06-08 DIAGNOSIS — N2581 Secondary hyperparathyroidism of renal origin: Secondary | ICD-10-CM | POA: Diagnosis not present

## 2017-06-08 DIAGNOSIS — Z992 Dependence on renal dialysis: Secondary | ICD-10-CM | POA: Diagnosis not present

## 2017-06-08 DIAGNOSIS — N186 End stage renal disease: Secondary | ICD-10-CM | POA: Diagnosis not present

## 2017-06-08 DIAGNOSIS — D509 Iron deficiency anemia, unspecified: Secondary | ICD-10-CM | POA: Diagnosis not present

## 2017-06-08 DIAGNOSIS — Z01818 Encounter for other preprocedural examination: Secondary | ICD-10-CM | POA: Diagnosis not present

## 2017-06-08 DIAGNOSIS — D631 Anemia in chronic kidney disease: Secondary | ICD-10-CM | POA: Diagnosis not present

## 2017-06-09 DIAGNOSIS — N186 End stage renal disease: Secondary | ICD-10-CM | POA: Diagnosis not present

## 2017-06-09 DIAGNOSIS — N2581 Secondary hyperparathyroidism of renal origin: Secondary | ICD-10-CM | POA: Diagnosis not present

## 2017-06-09 DIAGNOSIS — Z992 Dependence on renal dialysis: Secondary | ICD-10-CM | POA: Diagnosis not present

## 2017-06-09 DIAGNOSIS — D509 Iron deficiency anemia, unspecified: Secondary | ICD-10-CM | POA: Diagnosis not present

## 2017-06-09 DIAGNOSIS — D631 Anemia in chronic kidney disease: Secondary | ICD-10-CM | POA: Diagnosis not present

## 2017-06-10 DIAGNOSIS — N186 End stage renal disease: Secondary | ICD-10-CM | POA: Diagnosis not present

## 2017-06-10 DIAGNOSIS — D631 Anemia in chronic kidney disease: Secondary | ICD-10-CM | POA: Diagnosis not present

## 2017-06-10 DIAGNOSIS — D509 Iron deficiency anemia, unspecified: Secondary | ICD-10-CM | POA: Diagnosis not present

## 2017-06-10 DIAGNOSIS — N2581 Secondary hyperparathyroidism of renal origin: Secondary | ICD-10-CM | POA: Diagnosis not present

## 2017-06-10 DIAGNOSIS — Z992 Dependence on renal dialysis: Secondary | ICD-10-CM | POA: Diagnosis not present

## 2017-06-11 DIAGNOSIS — D631 Anemia in chronic kidney disease: Secondary | ICD-10-CM | POA: Diagnosis not present

## 2017-06-11 DIAGNOSIS — N2581 Secondary hyperparathyroidism of renal origin: Secondary | ICD-10-CM | POA: Diagnosis not present

## 2017-06-11 DIAGNOSIS — D509 Iron deficiency anemia, unspecified: Secondary | ICD-10-CM | POA: Diagnosis not present

## 2017-06-11 DIAGNOSIS — Z992 Dependence on renal dialysis: Secondary | ICD-10-CM | POA: Diagnosis not present

## 2017-06-11 DIAGNOSIS — N186 End stage renal disease: Secondary | ICD-10-CM | POA: Diagnosis not present

## 2017-06-12 DIAGNOSIS — D509 Iron deficiency anemia, unspecified: Secondary | ICD-10-CM | POA: Diagnosis not present

## 2017-06-12 DIAGNOSIS — N186 End stage renal disease: Secondary | ICD-10-CM | POA: Diagnosis not present

## 2017-06-12 DIAGNOSIS — N2581 Secondary hyperparathyroidism of renal origin: Secondary | ICD-10-CM | POA: Diagnosis not present

## 2017-06-12 DIAGNOSIS — D631 Anemia in chronic kidney disease: Secondary | ICD-10-CM | POA: Diagnosis not present

## 2017-06-12 DIAGNOSIS — Z992 Dependence on renal dialysis: Secondary | ICD-10-CM | POA: Diagnosis not present

## 2017-06-13 DIAGNOSIS — N186 End stage renal disease: Secondary | ICD-10-CM | POA: Diagnosis not present

## 2017-06-13 DIAGNOSIS — D509 Iron deficiency anemia, unspecified: Secondary | ICD-10-CM | POA: Diagnosis not present

## 2017-06-13 DIAGNOSIS — N2581 Secondary hyperparathyroidism of renal origin: Secondary | ICD-10-CM | POA: Diagnosis not present

## 2017-06-13 DIAGNOSIS — Z992 Dependence on renal dialysis: Secondary | ICD-10-CM | POA: Diagnosis not present

## 2017-06-13 DIAGNOSIS — D631 Anemia in chronic kidney disease: Secondary | ICD-10-CM | POA: Diagnosis not present

## 2017-06-14 DIAGNOSIS — Z992 Dependence on renal dialysis: Secondary | ICD-10-CM | POA: Diagnosis not present

## 2017-06-14 DIAGNOSIS — N186 End stage renal disease: Secondary | ICD-10-CM | POA: Diagnosis not present

## 2017-06-14 DIAGNOSIS — D509 Iron deficiency anemia, unspecified: Secondary | ICD-10-CM | POA: Diagnosis not present

## 2017-06-14 DIAGNOSIS — N2581 Secondary hyperparathyroidism of renal origin: Secondary | ICD-10-CM | POA: Diagnosis not present

## 2017-06-14 DIAGNOSIS — D631 Anemia in chronic kidney disease: Secondary | ICD-10-CM | POA: Diagnosis not present

## 2017-06-15 DIAGNOSIS — D631 Anemia in chronic kidney disease: Secondary | ICD-10-CM | POA: Diagnosis not present

## 2017-06-15 DIAGNOSIS — N2581 Secondary hyperparathyroidism of renal origin: Secondary | ICD-10-CM | POA: Diagnosis not present

## 2017-06-15 DIAGNOSIS — Z992 Dependence on renal dialysis: Secondary | ICD-10-CM | POA: Diagnosis not present

## 2017-06-15 DIAGNOSIS — D509 Iron deficiency anemia, unspecified: Secondary | ICD-10-CM | POA: Diagnosis not present

## 2017-06-15 DIAGNOSIS — N186 End stage renal disease: Secondary | ICD-10-CM | POA: Diagnosis not present

## 2017-06-16 DIAGNOSIS — D631 Anemia in chronic kidney disease: Secondary | ICD-10-CM | POA: Diagnosis not present

## 2017-06-16 DIAGNOSIS — N186 End stage renal disease: Secondary | ICD-10-CM | POA: Diagnosis not present

## 2017-06-16 DIAGNOSIS — Z992 Dependence on renal dialysis: Secondary | ICD-10-CM | POA: Diagnosis not present

## 2017-06-16 DIAGNOSIS — N2581 Secondary hyperparathyroidism of renal origin: Secondary | ICD-10-CM | POA: Diagnosis not present

## 2017-06-16 DIAGNOSIS — D509 Iron deficiency anemia, unspecified: Secondary | ICD-10-CM | POA: Diagnosis not present

## 2017-06-17 DIAGNOSIS — Z992 Dependence on renal dialysis: Secondary | ICD-10-CM | POA: Diagnosis not present

## 2017-06-17 DIAGNOSIS — N2581 Secondary hyperparathyroidism of renal origin: Secondary | ICD-10-CM | POA: Diagnosis not present

## 2017-06-17 DIAGNOSIS — D631 Anemia in chronic kidney disease: Secondary | ICD-10-CM | POA: Diagnosis not present

## 2017-06-17 DIAGNOSIS — D509 Iron deficiency anemia, unspecified: Secondary | ICD-10-CM | POA: Diagnosis not present

## 2017-06-17 DIAGNOSIS — N186 End stage renal disease: Secondary | ICD-10-CM | POA: Diagnosis not present

## 2017-06-18 DIAGNOSIS — N186 End stage renal disease: Secondary | ICD-10-CM | POA: Diagnosis not present

## 2017-06-18 DIAGNOSIS — D509 Iron deficiency anemia, unspecified: Secondary | ICD-10-CM | POA: Diagnosis not present

## 2017-06-18 DIAGNOSIS — Z992 Dependence on renal dialysis: Secondary | ICD-10-CM | POA: Diagnosis not present

## 2017-06-18 DIAGNOSIS — D631 Anemia in chronic kidney disease: Secondary | ICD-10-CM | POA: Diagnosis not present

## 2017-06-18 DIAGNOSIS — N2581 Secondary hyperparathyroidism of renal origin: Secondary | ICD-10-CM | POA: Diagnosis not present

## 2017-06-19 DIAGNOSIS — Z992 Dependence on renal dialysis: Secondary | ICD-10-CM | POA: Diagnosis not present

## 2017-06-19 DIAGNOSIS — D631 Anemia in chronic kidney disease: Secondary | ICD-10-CM | POA: Diagnosis not present

## 2017-06-19 DIAGNOSIS — D509 Iron deficiency anemia, unspecified: Secondary | ICD-10-CM | POA: Diagnosis not present

## 2017-06-19 DIAGNOSIS — N2581 Secondary hyperparathyroidism of renal origin: Secondary | ICD-10-CM | POA: Diagnosis not present

## 2017-06-19 DIAGNOSIS — N186 End stage renal disease: Secondary | ICD-10-CM | POA: Diagnosis not present

## 2017-06-20 DIAGNOSIS — D509 Iron deficiency anemia, unspecified: Secondary | ICD-10-CM | POA: Diagnosis not present

## 2017-06-20 DIAGNOSIS — Z992 Dependence on renal dialysis: Secondary | ICD-10-CM | POA: Diagnosis not present

## 2017-06-20 DIAGNOSIS — N2581 Secondary hyperparathyroidism of renal origin: Secondary | ICD-10-CM | POA: Diagnosis not present

## 2017-06-20 DIAGNOSIS — N186 End stage renal disease: Secondary | ICD-10-CM | POA: Diagnosis not present

## 2017-06-20 DIAGNOSIS — D631 Anemia in chronic kidney disease: Secondary | ICD-10-CM | POA: Diagnosis not present

## 2017-06-21 DIAGNOSIS — T8571XA Infection and inflammatory reaction due to peritoneal dialysis catheter, initial encounter: Secondary | ICD-10-CM | POA: Diagnosis not present

## 2017-06-21 DIAGNOSIS — R109 Unspecified abdominal pain: Secondary | ICD-10-CM | POA: Diagnosis not present

## 2017-06-21 DIAGNOSIS — R630 Anorexia: Secondary | ICD-10-CM | POA: Diagnosis not present

## 2017-06-21 DIAGNOSIS — B9689 Other specified bacterial agents as the cause of diseases classified elsewhere: Secondary | ICD-10-CM | POA: Diagnosis not present

## 2017-06-21 DIAGNOSIS — N2581 Secondary hyperparathyroidism of renal origin: Secondary | ICD-10-CM | POA: Diagnosis not present

## 2017-06-21 DIAGNOSIS — Z992 Dependence on renal dialysis: Secondary | ICD-10-CM | POA: Diagnosis not present

## 2017-06-21 DIAGNOSIS — I120 Hypertensive chronic kidney disease with stage 5 chronic kidney disease or end stage renal disease: Secondary | ICD-10-CM | POA: Diagnosis not present

## 2017-06-21 DIAGNOSIS — N186 End stage renal disease: Secondary | ICD-10-CM | POA: Diagnosis not present

## 2017-06-21 DIAGNOSIS — K658 Other peritonitis: Secondary | ICD-10-CM | POA: Diagnosis not present

## 2017-06-21 DIAGNOSIS — D509 Iron deficiency anemia, unspecified: Secondary | ICD-10-CM | POA: Diagnosis not present

## 2017-06-21 DIAGNOSIS — K659 Peritonitis, unspecified: Secondary | ICD-10-CM | POA: Diagnosis not present

## 2017-06-21 DIAGNOSIS — R140 Abdominal distension (gaseous): Secondary | ICD-10-CM | POA: Diagnosis not present

## 2017-06-21 DIAGNOSIS — K652 Spontaneous bacterial peritonitis: Secondary | ICD-10-CM | POA: Diagnosis not present

## 2017-06-21 DIAGNOSIS — D631 Anemia in chronic kidney disease: Secondary | ICD-10-CM | POA: Diagnosis not present

## 2017-06-22 DIAGNOSIS — K658 Other peritonitis: Secondary | ICD-10-CM | POA: Diagnosis not present

## 2017-06-22 DIAGNOSIS — T8571XA Infection and inflammatory reaction due to peritoneal dialysis catheter, initial encounter: Secondary | ICD-10-CM | POA: Insufficient documentation

## 2017-06-22 DIAGNOSIS — N028 Recurrent and persistent hematuria with other morphologic changes: Secondary | ICD-10-CM | POA: Diagnosis not present

## 2017-06-22 DIAGNOSIS — I120 Hypertensive chronic kidney disease with stage 5 chronic kidney disease or end stage renal disease: Secondary | ICD-10-CM | POA: Diagnosis present

## 2017-06-22 DIAGNOSIS — Z9115 Patient's noncompliance with renal dialysis: Secondary | ICD-10-CM | POA: Diagnosis not present

## 2017-06-22 DIAGNOSIS — D631 Anemia in chronic kidney disease: Secondary | ICD-10-CM | POA: Diagnosis present

## 2017-06-22 DIAGNOSIS — K652 Spontaneous bacterial peritonitis: Secondary | ICD-10-CM | POA: Diagnosis present

## 2017-06-22 DIAGNOSIS — E876 Hypokalemia: Secondary | ICD-10-CM | POA: Diagnosis present

## 2017-06-22 DIAGNOSIS — D509 Iron deficiency anemia, unspecified: Secondary | ICD-10-CM | POA: Diagnosis not present

## 2017-06-22 DIAGNOSIS — N2581 Secondary hyperparathyroidism of renal origin: Secondary | ICD-10-CM | POA: Diagnosis not present

## 2017-06-22 DIAGNOSIS — D649 Anemia, unspecified: Secondary | ICD-10-CM | POA: Diagnosis not present

## 2017-06-22 DIAGNOSIS — Z992 Dependence on renal dialysis: Secondary | ICD-10-CM | POA: Diagnosis not present

## 2017-06-22 DIAGNOSIS — Z87891 Personal history of nicotine dependence: Secondary | ICD-10-CM | POA: Diagnosis not present

## 2017-06-22 DIAGNOSIS — N186 End stage renal disease: Secondary | ICD-10-CM | POA: Diagnosis present

## 2017-06-22 DIAGNOSIS — K659 Peritonitis, unspecified: Secondary | ICD-10-CM | POA: Diagnosis not present

## 2017-06-22 DIAGNOSIS — F4320 Adjustment disorder, unspecified: Secondary | ICD-10-CM | POA: Diagnosis present

## 2017-06-22 DIAGNOSIS — B9689 Other specified bacterial agents as the cause of diseases classified elsewhere: Secondary | ICD-10-CM | POA: Diagnosis present

## 2017-06-23 DIAGNOSIS — D631 Anemia in chronic kidney disease: Secondary | ICD-10-CM | POA: Diagnosis not present

## 2017-06-23 DIAGNOSIS — D509 Iron deficiency anemia, unspecified: Secondary | ICD-10-CM | POA: Diagnosis not present

## 2017-06-23 DIAGNOSIS — N186 End stage renal disease: Secondary | ICD-10-CM | POA: Diagnosis not present

## 2017-06-23 DIAGNOSIS — N2581 Secondary hyperparathyroidism of renal origin: Secondary | ICD-10-CM | POA: Diagnosis not present

## 2017-06-24 DIAGNOSIS — N2581 Secondary hyperparathyroidism of renal origin: Secondary | ICD-10-CM | POA: Diagnosis not present

## 2017-06-24 DIAGNOSIS — N186 End stage renal disease: Secondary | ICD-10-CM | POA: Diagnosis not present

## 2017-06-24 DIAGNOSIS — D509 Iron deficiency anemia, unspecified: Secondary | ICD-10-CM | POA: Diagnosis not present

## 2017-06-24 DIAGNOSIS — D631 Anemia in chronic kidney disease: Secondary | ICD-10-CM | POA: Diagnosis not present

## 2017-06-24 DIAGNOSIS — Z992 Dependence on renal dialysis: Secondary | ICD-10-CM | POA: Diagnosis not present

## 2017-06-25 DIAGNOSIS — D509 Iron deficiency anemia, unspecified: Secondary | ICD-10-CM | POA: Diagnosis not present

## 2017-06-25 DIAGNOSIS — N186 End stage renal disease: Secondary | ICD-10-CM | POA: Diagnosis not present

## 2017-06-25 DIAGNOSIS — N2581 Secondary hyperparathyroidism of renal origin: Secondary | ICD-10-CM | POA: Diagnosis not present

## 2017-06-25 DIAGNOSIS — Z992 Dependence on renal dialysis: Secondary | ICD-10-CM | POA: Diagnosis not present

## 2017-06-25 DIAGNOSIS — D631 Anemia in chronic kidney disease: Secondary | ICD-10-CM | POA: Diagnosis not present

## 2017-06-26 DIAGNOSIS — Z992 Dependence on renal dialysis: Secondary | ICD-10-CM | POA: Diagnosis not present

## 2017-06-26 DIAGNOSIS — N186 End stage renal disease: Secondary | ICD-10-CM | POA: Diagnosis not present

## 2017-06-26 DIAGNOSIS — D631 Anemia in chronic kidney disease: Secondary | ICD-10-CM | POA: Diagnosis not present

## 2017-06-26 DIAGNOSIS — N2581 Secondary hyperparathyroidism of renal origin: Secondary | ICD-10-CM | POA: Diagnosis not present

## 2017-06-26 DIAGNOSIS — D509 Iron deficiency anemia, unspecified: Secondary | ICD-10-CM | POA: Diagnosis not present

## 2017-06-27 DIAGNOSIS — D509 Iron deficiency anemia, unspecified: Secondary | ICD-10-CM | POA: Diagnosis not present

## 2017-06-27 DIAGNOSIS — Z992 Dependence on renal dialysis: Secondary | ICD-10-CM | POA: Diagnosis not present

## 2017-06-27 DIAGNOSIS — N186 End stage renal disease: Secondary | ICD-10-CM | POA: Diagnosis not present

## 2017-06-27 DIAGNOSIS — N2581 Secondary hyperparathyroidism of renal origin: Secondary | ICD-10-CM | POA: Diagnosis not present

## 2017-06-27 DIAGNOSIS — D631 Anemia in chronic kidney disease: Secondary | ICD-10-CM | POA: Diagnosis not present

## 2017-06-28 DIAGNOSIS — Z992 Dependence on renal dialysis: Secondary | ICD-10-CM | POA: Diagnosis not present

## 2017-06-28 DIAGNOSIS — N186 End stage renal disease: Secondary | ICD-10-CM | POA: Diagnosis not present

## 2017-06-28 DIAGNOSIS — D509 Iron deficiency anemia, unspecified: Secondary | ICD-10-CM | POA: Diagnosis not present

## 2017-06-28 DIAGNOSIS — N2581 Secondary hyperparathyroidism of renal origin: Secondary | ICD-10-CM | POA: Diagnosis not present

## 2017-06-28 DIAGNOSIS — D631 Anemia in chronic kidney disease: Secondary | ICD-10-CM | POA: Diagnosis not present

## 2017-06-29 DIAGNOSIS — N186 End stage renal disease: Secondary | ICD-10-CM | POA: Diagnosis not present

## 2017-06-29 DIAGNOSIS — T8571XA Infection and inflammatory reaction due to peritoneal dialysis catheter, initial encounter: Secondary | ICD-10-CM | POA: Diagnosis not present

## 2017-06-29 DIAGNOSIS — D631 Anemia in chronic kidney disease: Secondary | ICD-10-CM | POA: Diagnosis not present

## 2017-06-29 DIAGNOSIS — N2581 Secondary hyperparathyroidism of renal origin: Secondary | ICD-10-CM | POA: Diagnosis not present

## 2017-06-30 DIAGNOSIS — Z5181 Encounter for therapeutic drug level monitoring: Secondary | ICD-10-CM | POA: Diagnosis not present

## 2017-06-30 DIAGNOSIS — N2581 Secondary hyperparathyroidism of renal origin: Secondary | ICD-10-CM | POA: Diagnosis not present

## 2017-06-30 DIAGNOSIS — D631 Anemia in chronic kidney disease: Secondary | ICD-10-CM | POA: Diagnosis not present

## 2017-06-30 DIAGNOSIS — T8571XA Infection and inflammatory reaction due to peritoneal dialysis catheter, initial encounter: Secondary | ICD-10-CM | POA: Diagnosis not present

## 2017-06-30 DIAGNOSIS — N186 End stage renal disease: Secondary | ICD-10-CM | POA: Diagnosis not present

## 2017-06-30 DIAGNOSIS — T8571XD Infection and inflammatory reaction due to peritoneal dialysis catheter, subsequent encounter: Secondary | ICD-10-CM | POA: Diagnosis not present

## 2017-07-01 DIAGNOSIS — T8571XA Infection and inflammatory reaction due to peritoneal dialysis catheter, initial encounter: Secondary | ICD-10-CM | POA: Diagnosis not present

## 2017-07-01 DIAGNOSIS — N2581 Secondary hyperparathyroidism of renal origin: Secondary | ICD-10-CM | POA: Diagnosis not present

## 2017-07-01 DIAGNOSIS — D631 Anemia in chronic kidney disease: Secondary | ICD-10-CM | POA: Diagnosis not present

## 2017-07-01 DIAGNOSIS — N186 End stage renal disease: Secondary | ICD-10-CM | POA: Diagnosis not present

## 2017-07-02 DIAGNOSIS — N186 End stage renal disease: Secondary | ICD-10-CM | POA: Diagnosis not present

## 2017-07-02 DIAGNOSIS — N2581 Secondary hyperparathyroidism of renal origin: Secondary | ICD-10-CM | POA: Diagnosis not present

## 2017-07-02 DIAGNOSIS — D631 Anemia in chronic kidney disease: Secondary | ICD-10-CM | POA: Diagnosis not present

## 2017-07-02 DIAGNOSIS — T8571XA Infection and inflammatory reaction due to peritoneal dialysis catheter, initial encounter: Secondary | ICD-10-CM | POA: Diagnosis not present

## 2017-07-03 DIAGNOSIS — N2581 Secondary hyperparathyroidism of renal origin: Secondary | ICD-10-CM | POA: Diagnosis not present

## 2017-07-03 DIAGNOSIS — D631 Anemia in chronic kidney disease: Secondary | ICD-10-CM | POA: Diagnosis not present

## 2017-07-03 DIAGNOSIS — N186 End stage renal disease: Secondary | ICD-10-CM | POA: Diagnosis not present

## 2017-07-03 DIAGNOSIS — T8571XA Infection and inflammatory reaction due to peritoneal dialysis catheter, initial encounter: Secondary | ICD-10-CM | POA: Diagnosis not present

## 2017-07-04 DIAGNOSIS — T8571XA Infection and inflammatory reaction due to peritoneal dialysis catheter, initial encounter: Secondary | ICD-10-CM | POA: Diagnosis not present

## 2017-07-04 DIAGNOSIS — D631 Anemia in chronic kidney disease: Secondary | ICD-10-CM | POA: Diagnosis not present

## 2017-07-04 DIAGNOSIS — N186 End stage renal disease: Secondary | ICD-10-CM | POA: Diagnosis not present

## 2017-07-04 DIAGNOSIS — N2581 Secondary hyperparathyroidism of renal origin: Secondary | ICD-10-CM | POA: Diagnosis not present

## 2017-07-05 DIAGNOSIS — D631 Anemia in chronic kidney disease: Secondary | ICD-10-CM | POA: Diagnosis not present

## 2017-07-05 DIAGNOSIS — N2581 Secondary hyperparathyroidism of renal origin: Secondary | ICD-10-CM | POA: Diagnosis not present

## 2017-07-05 DIAGNOSIS — N186 End stage renal disease: Secondary | ICD-10-CM | POA: Diagnosis not present

## 2017-07-05 DIAGNOSIS — T8571XA Infection and inflammatory reaction due to peritoneal dialysis catheter, initial encounter: Secondary | ICD-10-CM | POA: Diagnosis not present

## 2017-07-06 DIAGNOSIS — N2581 Secondary hyperparathyroidism of renal origin: Secondary | ICD-10-CM | POA: Diagnosis not present

## 2017-07-06 DIAGNOSIS — N186 End stage renal disease: Secondary | ICD-10-CM | POA: Diagnosis not present

## 2017-07-06 DIAGNOSIS — T8571XA Infection and inflammatory reaction due to peritoneal dialysis catheter, initial encounter: Secondary | ICD-10-CM | POA: Diagnosis not present

## 2017-07-06 DIAGNOSIS — D631 Anemia in chronic kidney disease: Secondary | ICD-10-CM | POA: Diagnosis not present

## 2017-07-07 DIAGNOSIS — T8571XA Infection and inflammatory reaction due to peritoneal dialysis catheter, initial encounter: Secondary | ICD-10-CM | POA: Diagnosis not present

## 2017-07-07 DIAGNOSIS — D631 Anemia in chronic kidney disease: Secondary | ICD-10-CM | POA: Diagnosis not present

## 2017-07-07 DIAGNOSIS — N2581 Secondary hyperparathyroidism of renal origin: Secondary | ICD-10-CM | POA: Diagnosis not present

## 2017-07-07 DIAGNOSIS — N186 End stage renal disease: Secondary | ICD-10-CM | POA: Diagnosis not present

## 2017-07-08 DIAGNOSIS — N2581 Secondary hyperparathyroidism of renal origin: Secondary | ICD-10-CM | POA: Diagnosis not present

## 2017-07-08 DIAGNOSIS — T8571XA Infection and inflammatory reaction due to peritoneal dialysis catheter, initial encounter: Secondary | ICD-10-CM | POA: Diagnosis not present

## 2017-07-08 DIAGNOSIS — D631 Anemia in chronic kidney disease: Secondary | ICD-10-CM | POA: Diagnosis not present

## 2017-07-08 DIAGNOSIS — N186 End stage renal disease: Secondary | ICD-10-CM | POA: Diagnosis not present

## 2017-07-09 DIAGNOSIS — T8571XA Infection and inflammatory reaction due to peritoneal dialysis catheter, initial encounter: Secondary | ICD-10-CM | POA: Diagnosis not present

## 2017-07-09 DIAGNOSIS — N186 End stage renal disease: Secondary | ICD-10-CM | POA: Diagnosis not present

## 2017-07-09 DIAGNOSIS — N2581 Secondary hyperparathyroidism of renal origin: Secondary | ICD-10-CM | POA: Diagnosis not present

## 2017-07-09 DIAGNOSIS — D631 Anemia in chronic kidney disease: Secondary | ICD-10-CM | POA: Diagnosis not present

## 2017-07-10 DIAGNOSIS — D631 Anemia in chronic kidney disease: Secondary | ICD-10-CM | POA: Diagnosis not present

## 2017-07-10 DIAGNOSIS — T8571XA Infection and inflammatory reaction due to peritoneal dialysis catheter, initial encounter: Secondary | ICD-10-CM | POA: Diagnosis not present

## 2017-07-10 DIAGNOSIS — N186 End stage renal disease: Secondary | ICD-10-CM | POA: Diagnosis not present

## 2017-07-10 DIAGNOSIS — N2581 Secondary hyperparathyroidism of renal origin: Secondary | ICD-10-CM | POA: Diagnosis not present

## 2017-07-11 DIAGNOSIS — N2581 Secondary hyperparathyroidism of renal origin: Secondary | ICD-10-CM | POA: Diagnosis not present

## 2017-07-11 DIAGNOSIS — T8571XA Infection and inflammatory reaction due to peritoneal dialysis catheter, initial encounter: Secondary | ICD-10-CM | POA: Diagnosis not present

## 2017-07-11 DIAGNOSIS — D631 Anemia in chronic kidney disease: Secondary | ICD-10-CM | POA: Diagnosis not present

## 2017-07-11 DIAGNOSIS — N186 End stage renal disease: Secondary | ICD-10-CM | POA: Diagnosis not present

## 2017-07-13 DIAGNOSIS — D631 Anemia in chronic kidney disease: Secondary | ICD-10-CM | POA: Diagnosis not present

## 2017-07-13 DIAGNOSIS — T8571XA Infection and inflammatory reaction due to peritoneal dialysis catheter, initial encounter: Secondary | ICD-10-CM | POA: Diagnosis not present

## 2017-07-13 DIAGNOSIS — N2581 Secondary hyperparathyroidism of renal origin: Secondary | ICD-10-CM | POA: Diagnosis not present

## 2017-07-13 DIAGNOSIS — N186 End stage renal disease: Secondary | ICD-10-CM | POA: Diagnosis not present

## 2017-07-14 DIAGNOSIS — N186 End stage renal disease: Secondary | ICD-10-CM | POA: Diagnosis not present

## 2017-07-14 DIAGNOSIS — N2581 Secondary hyperparathyroidism of renal origin: Secondary | ICD-10-CM | POA: Diagnosis not present

## 2017-07-14 DIAGNOSIS — D631 Anemia in chronic kidney disease: Secondary | ICD-10-CM | POA: Diagnosis not present

## 2017-07-14 DIAGNOSIS — T8571XA Infection and inflammatory reaction due to peritoneal dialysis catheter, initial encounter: Secondary | ICD-10-CM | POA: Diagnosis not present

## 2017-07-15 DIAGNOSIS — N186 End stage renal disease: Secondary | ICD-10-CM | POA: Diagnosis not present

## 2017-07-15 DIAGNOSIS — N2581 Secondary hyperparathyroidism of renal origin: Secondary | ICD-10-CM | POA: Diagnosis not present

## 2017-07-15 DIAGNOSIS — D631 Anemia in chronic kidney disease: Secondary | ICD-10-CM | POA: Diagnosis not present

## 2017-07-15 DIAGNOSIS — T8571XA Infection and inflammatory reaction due to peritoneal dialysis catheter, initial encounter: Secondary | ICD-10-CM | POA: Diagnosis not present

## 2017-07-16 DIAGNOSIS — D631 Anemia in chronic kidney disease: Secondary | ICD-10-CM | POA: Diagnosis not present

## 2017-07-16 DIAGNOSIS — N2581 Secondary hyperparathyroidism of renal origin: Secondary | ICD-10-CM | POA: Diagnosis not present

## 2017-07-16 DIAGNOSIS — N186 End stage renal disease: Secondary | ICD-10-CM | POA: Diagnosis not present

## 2017-07-16 DIAGNOSIS — T8571XA Infection and inflammatory reaction due to peritoneal dialysis catheter, initial encounter: Secondary | ICD-10-CM | POA: Diagnosis not present

## 2017-07-17 DIAGNOSIS — T8571XA Infection and inflammatory reaction due to peritoneal dialysis catheter, initial encounter: Secondary | ICD-10-CM | POA: Diagnosis not present

## 2017-07-17 DIAGNOSIS — N2581 Secondary hyperparathyroidism of renal origin: Secondary | ICD-10-CM | POA: Diagnosis not present

## 2017-07-17 DIAGNOSIS — D631 Anemia in chronic kidney disease: Secondary | ICD-10-CM | POA: Diagnosis not present

## 2017-07-17 DIAGNOSIS — N186 End stage renal disease: Secondary | ICD-10-CM | POA: Diagnosis not present

## 2017-07-18 DIAGNOSIS — T8571XA Infection and inflammatory reaction due to peritoneal dialysis catheter, initial encounter: Secondary | ICD-10-CM | POA: Diagnosis not present

## 2017-07-18 DIAGNOSIS — N186 End stage renal disease: Secondary | ICD-10-CM | POA: Diagnosis not present

## 2017-07-18 DIAGNOSIS — D631 Anemia in chronic kidney disease: Secondary | ICD-10-CM | POA: Diagnosis not present

## 2017-07-18 DIAGNOSIS — N2581 Secondary hyperparathyroidism of renal origin: Secondary | ICD-10-CM | POA: Diagnosis not present

## 2017-07-19 DIAGNOSIS — D631 Anemia in chronic kidney disease: Secondary | ICD-10-CM | POA: Diagnosis not present

## 2017-07-19 DIAGNOSIS — N186 End stage renal disease: Secondary | ICD-10-CM | POA: Diagnosis not present

## 2017-07-19 DIAGNOSIS — N2581 Secondary hyperparathyroidism of renal origin: Secondary | ICD-10-CM | POA: Diagnosis not present

## 2017-07-19 DIAGNOSIS — T8571XA Infection and inflammatory reaction due to peritoneal dialysis catheter, initial encounter: Secondary | ICD-10-CM | POA: Diagnosis not present

## 2017-07-20 DIAGNOSIS — N186 End stage renal disease: Secondary | ICD-10-CM | POA: Diagnosis not present

## 2017-07-20 DIAGNOSIS — T8571XA Infection and inflammatory reaction due to peritoneal dialysis catheter, initial encounter: Secondary | ICD-10-CM | POA: Diagnosis not present

## 2017-07-20 DIAGNOSIS — N2581 Secondary hyperparathyroidism of renal origin: Secondary | ICD-10-CM | POA: Diagnosis not present

## 2017-07-20 DIAGNOSIS — D631 Anemia in chronic kidney disease: Secondary | ICD-10-CM | POA: Diagnosis not present

## 2017-07-21 DIAGNOSIS — T8571XA Infection and inflammatory reaction due to peritoneal dialysis catheter, initial encounter: Secondary | ICD-10-CM | POA: Diagnosis not present

## 2017-07-21 DIAGNOSIS — N2581 Secondary hyperparathyroidism of renal origin: Secondary | ICD-10-CM | POA: Diagnosis not present

## 2017-07-21 DIAGNOSIS — Z418 Encounter for other procedures for purposes other than remedying health state: Secondary | ICD-10-CM | POA: Diagnosis not present

## 2017-07-21 DIAGNOSIS — D631 Anemia in chronic kidney disease: Secondary | ICD-10-CM | POA: Diagnosis not present

## 2017-07-21 DIAGNOSIS — N186 End stage renal disease: Secondary | ICD-10-CM | POA: Diagnosis not present

## 2017-07-22 DIAGNOSIS — N186 End stage renal disease: Secondary | ICD-10-CM | POA: Diagnosis not present

## 2017-07-22 DIAGNOSIS — N2581 Secondary hyperparathyroidism of renal origin: Secondary | ICD-10-CM | POA: Diagnosis not present

## 2017-07-22 DIAGNOSIS — T8571XA Infection and inflammatory reaction due to peritoneal dialysis catheter, initial encounter: Secondary | ICD-10-CM | POA: Diagnosis not present

## 2017-07-22 DIAGNOSIS — D631 Anemia in chronic kidney disease: Secondary | ICD-10-CM | POA: Diagnosis not present

## 2017-07-23 DIAGNOSIS — N186 End stage renal disease: Secondary | ICD-10-CM | POA: Diagnosis not present

## 2017-07-23 DIAGNOSIS — T8571XA Infection and inflammatory reaction due to peritoneal dialysis catheter, initial encounter: Secondary | ICD-10-CM | POA: Diagnosis not present

## 2017-07-23 DIAGNOSIS — D631 Anemia in chronic kidney disease: Secondary | ICD-10-CM | POA: Diagnosis not present

## 2017-07-23 DIAGNOSIS — N2581 Secondary hyperparathyroidism of renal origin: Secondary | ICD-10-CM | POA: Diagnosis not present

## 2017-07-24 DIAGNOSIS — N2581 Secondary hyperparathyroidism of renal origin: Secondary | ICD-10-CM | POA: Diagnosis not present

## 2017-07-24 DIAGNOSIS — T8571XA Infection and inflammatory reaction due to peritoneal dialysis catheter, initial encounter: Secondary | ICD-10-CM | POA: Diagnosis not present

## 2017-07-24 DIAGNOSIS — N186 End stage renal disease: Secondary | ICD-10-CM | POA: Diagnosis not present

## 2017-07-24 DIAGNOSIS — D631 Anemia in chronic kidney disease: Secondary | ICD-10-CM | POA: Diagnosis not present

## 2017-07-25 DIAGNOSIS — N2581 Secondary hyperparathyroidism of renal origin: Secondary | ICD-10-CM | POA: Diagnosis not present

## 2017-07-25 DIAGNOSIS — T8571XA Infection and inflammatory reaction due to peritoneal dialysis catheter, initial encounter: Secondary | ICD-10-CM | POA: Diagnosis not present

## 2017-07-25 DIAGNOSIS — N186 End stage renal disease: Secondary | ICD-10-CM | POA: Diagnosis not present

## 2017-07-25 DIAGNOSIS — D631 Anemia in chronic kidney disease: Secondary | ICD-10-CM | POA: Diagnosis not present

## 2017-07-26 DIAGNOSIS — D631 Anemia in chronic kidney disease: Secondary | ICD-10-CM | POA: Diagnosis not present

## 2017-07-26 DIAGNOSIS — I10 Essential (primary) hypertension: Secondary | ICD-10-CM | POA: Diagnosis not present

## 2017-07-26 DIAGNOSIS — Z6838 Body mass index (BMI) 38.0-38.9, adult: Secondary | ICD-10-CM | POA: Diagnosis not present

## 2017-07-26 DIAGNOSIS — N2581 Secondary hyperparathyroidism of renal origin: Secondary | ICD-10-CM | POA: Diagnosis not present

## 2017-07-26 DIAGNOSIS — T8571XA Infection and inflammatory reaction due to peritoneal dialysis catheter, initial encounter: Secondary | ICD-10-CM | POA: Diagnosis not present

## 2017-07-26 DIAGNOSIS — R5383 Other fatigue: Secondary | ICD-10-CM | POA: Diagnosis not present

## 2017-07-26 DIAGNOSIS — B373 Candidiasis of vulva and vagina: Secondary | ICD-10-CM | POA: Diagnosis not present

## 2017-07-26 DIAGNOSIS — N08 Glomerular disorders in diseases classified elsewhere: Secondary | ICD-10-CM | POA: Diagnosis not present

## 2017-07-26 DIAGNOSIS — N185 Chronic kidney disease, stage 5: Secondary | ICD-10-CM | POA: Diagnosis not present

## 2017-07-26 DIAGNOSIS — Z113 Encounter for screening for infections with a predominantly sexual mode of transmission: Secondary | ICD-10-CM | POA: Diagnosis not present

## 2017-07-26 DIAGNOSIS — N186 End stage renal disease: Secondary | ICD-10-CM | POA: Diagnosis not present

## 2017-07-27 DIAGNOSIS — D631 Anemia in chronic kidney disease: Secondary | ICD-10-CM | POA: Diagnosis not present

## 2017-07-27 DIAGNOSIS — N186 End stage renal disease: Secondary | ICD-10-CM | POA: Diagnosis not present

## 2017-07-27 DIAGNOSIS — N2581 Secondary hyperparathyroidism of renal origin: Secondary | ICD-10-CM | POA: Diagnosis not present

## 2017-07-27 DIAGNOSIS — T8571XA Infection and inflammatory reaction due to peritoneal dialysis catheter, initial encounter: Secondary | ICD-10-CM | POA: Diagnosis not present

## 2017-07-28 DIAGNOSIS — D631 Anemia in chronic kidney disease: Secondary | ICD-10-CM | POA: Diagnosis not present

## 2017-07-28 DIAGNOSIS — N186 End stage renal disease: Secondary | ICD-10-CM | POA: Diagnosis not present

## 2017-07-28 DIAGNOSIS — T8571XA Infection and inflammatory reaction due to peritoneal dialysis catheter, initial encounter: Secondary | ICD-10-CM | POA: Diagnosis not present

## 2017-07-28 DIAGNOSIS — N2581 Secondary hyperparathyroidism of renal origin: Secondary | ICD-10-CM | POA: Diagnosis not present

## 2017-07-29 DIAGNOSIS — D631 Anemia in chronic kidney disease: Secondary | ICD-10-CM | POA: Diagnosis not present

## 2017-07-29 DIAGNOSIS — T8571XA Infection and inflammatory reaction due to peritoneal dialysis catheter, initial encounter: Secondary | ICD-10-CM | POA: Diagnosis not present

## 2017-07-29 DIAGNOSIS — Z992 Dependence on renal dialysis: Secondary | ICD-10-CM | POA: Diagnosis not present

## 2017-07-29 DIAGNOSIS — N186 End stage renal disease: Secondary | ICD-10-CM | POA: Diagnosis not present

## 2017-07-29 DIAGNOSIS — N2581 Secondary hyperparathyroidism of renal origin: Secondary | ICD-10-CM | POA: Diagnosis not present

## 2017-07-30 DIAGNOSIS — N186 End stage renal disease: Secondary | ICD-10-CM | POA: Diagnosis not present

## 2017-07-31 DIAGNOSIS — N186 End stage renal disease: Secondary | ICD-10-CM | POA: Diagnosis not present

## 2017-08-01 DIAGNOSIS — N186 End stage renal disease: Secondary | ICD-10-CM | POA: Diagnosis not present

## 2017-08-02 DIAGNOSIS — N186 End stage renal disease: Secondary | ICD-10-CM | POA: Diagnosis not present

## 2017-08-02 DIAGNOSIS — D631 Anemia in chronic kidney disease: Secondary | ICD-10-CM | POA: Diagnosis not present

## 2017-08-02 DIAGNOSIS — N2581 Secondary hyperparathyroidism of renal origin: Secondary | ICD-10-CM | POA: Diagnosis not present

## 2017-08-03 DIAGNOSIS — D631 Anemia in chronic kidney disease: Secondary | ICD-10-CM | POA: Diagnosis not present

## 2017-08-03 DIAGNOSIS — N2581 Secondary hyperparathyroidism of renal origin: Secondary | ICD-10-CM | POA: Diagnosis not present

## 2017-08-03 DIAGNOSIS — N186 End stage renal disease: Secondary | ICD-10-CM | POA: Diagnosis not present

## 2017-08-04 DIAGNOSIS — D631 Anemia in chronic kidney disease: Secondary | ICD-10-CM | POA: Diagnosis not present

## 2017-08-04 DIAGNOSIS — N186 End stage renal disease: Secondary | ICD-10-CM | POA: Diagnosis not present

## 2017-08-04 DIAGNOSIS — N2581 Secondary hyperparathyroidism of renal origin: Secondary | ICD-10-CM | POA: Diagnosis not present

## 2017-08-05 DIAGNOSIS — N186 End stage renal disease: Secondary | ICD-10-CM | POA: Diagnosis not present

## 2017-08-05 DIAGNOSIS — N2581 Secondary hyperparathyroidism of renal origin: Secondary | ICD-10-CM | POA: Diagnosis not present

## 2017-08-05 DIAGNOSIS — D631 Anemia in chronic kidney disease: Secondary | ICD-10-CM | POA: Diagnosis not present

## 2017-08-06 DIAGNOSIS — N2581 Secondary hyperparathyroidism of renal origin: Secondary | ICD-10-CM | POA: Diagnosis not present

## 2017-08-06 DIAGNOSIS — D631 Anemia in chronic kidney disease: Secondary | ICD-10-CM | POA: Diagnosis not present

## 2017-08-06 DIAGNOSIS — N186 End stage renal disease: Secondary | ICD-10-CM | POA: Diagnosis not present

## 2017-08-07 DIAGNOSIS — N186 End stage renal disease: Secondary | ICD-10-CM | POA: Diagnosis not present

## 2017-08-07 DIAGNOSIS — D631 Anemia in chronic kidney disease: Secondary | ICD-10-CM | POA: Diagnosis not present

## 2017-08-07 DIAGNOSIS — N2581 Secondary hyperparathyroidism of renal origin: Secondary | ICD-10-CM | POA: Diagnosis not present

## 2017-08-08 DIAGNOSIS — N186 End stage renal disease: Secondary | ICD-10-CM | POA: Diagnosis not present

## 2017-08-08 DIAGNOSIS — N2581 Secondary hyperparathyroidism of renal origin: Secondary | ICD-10-CM | POA: Diagnosis not present

## 2017-08-08 DIAGNOSIS — D631 Anemia in chronic kidney disease: Secondary | ICD-10-CM | POA: Diagnosis not present

## 2017-08-10 DIAGNOSIS — N186 End stage renal disease: Secondary | ICD-10-CM | POA: Diagnosis not present

## 2017-08-10 DIAGNOSIS — N2581 Secondary hyperparathyroidism of renal origin: Secondary | ICD-10-CM | POA: Diagnosis not present

## 2017-08-10 DIAGNOSIS — D631 Anemia in chronic kidney disease: Secondary | ICD-10-CM | POA: Diagnosis not present

## 2017-08-12 DIAGNOSIS — N2581 Secondary hyperparathyroidism of renal origin: Secondary | ICD-10-CM | POA: Diagnosis not present

## 2017-08-12 DIAGNOSIS — N186 End stage renal disease: Secondary | ICD-10-CM | POA: Diagnosis not present

## 2017-08-12 DIAGNOSIS — D631 Anemia in chronic kidney disease: Secondary | ICD-10-CM | POA: Diagnosis not present

## 2017-08-13 DIAGNOSIS — N186 End stage renal disease: Secondary | ICD-10-CM | POA: Diagnosis not present

## 2017-08-13 DIAGNOSIS — N2581 Secondary hyperparathyroidism of renal origin: Secondary | ICD-10-CM | POA: Diagnosis not present

## 2017-08-13 DIAGNOSIS — D631 Anemia in chronic kidney disease: Secondary | ICD-10-CM | POA: Diagnosis not present

## 2017-08-14 DIAGNOSIS — N2581 Secondary hyperparathyroidism of renal origin: Secondary | ICD-10-CM | POA: Diagnosis not present

## 2017-08-14 DIAGNOSIS — D631 Anemia in chronic kidney disease: Secondary | ICD-10-CM | POA: Diagnosis not present

## 2017-08-14 DIAGNOSIS — N186 End stage renal disease: Secondary | ICD-10-CM | POA: Diagnosis not present

## 2017-08-16 DIAGNOSIS — N186 End stage renal disease: Secondary | ICD-10-CM | POA: Diagnosis not present

## 2017-08-16 DIAGNOSIS — N2581 Secondary hyperparathyroidism of renal origin: Secondary | ICD-10-CM | POA: Diagnosis not present

## 2017-08-16 DIAGNOSIS — D631 Anemia in chronic kidney disease: Secondary | ICD-10-CM | POA: Diagnosis not present

## 2017-08-17 DIAGNOSIS — N2581 Secondary hyperparathyroidism of renal origin: Secondary | ICD-10-CM | POA: Diagnosis not present

## 2017-08-17 DIAGNOSIS — N186 End stage renal disease: Secondary | ICD-10-CM | POA: Diagnosis not present

## 2017-08-17 DIAGNOSIS — D631 Anemia in chronic kidney disease: Secondary | ICD-10-CM | POA: Diagnosis not present

## 2017-08-18 DIAGNOSIS — N2581 Secondary hyperparathyroidism of renal origin: Secondary | ICD-10-CM | POA: Diagnosis not present

## 2017-08-18 DIAGNOSIS — N186 End stage renal disease: Secondary | ICD-10-CM | POA: Diagnosis not present

## 2017-08-18 DIAGNOSIS — D631 Anemia in chronic kidney disease: Secondary | ICD-10-CM | POA: Diagnosis not present

## 2017-08-19 DIAGNOSIS — N186 End stage renal disease: Secondary | ICD-10-CM | POA: Diagnosis not present

## 2017-08-19 DIAGNOSIS — N2581 Secondary hyperparathyroidism of renal origin: Secondary | ICD-10-CM | POA: Diagnosis not present

## 2017-08-19 DIAGNOSIS — D631 Anemia in chronic kidney disease: Secondary | ICD-10-CM | POA: Diagnosis not present

## 2017-08-20 DIAGNOSIS — N186 End stage renal disease: Secondary | ICD-10-CM | POA: Diagnosis not present

## 2017-08-20 DIAGNOSIS — N2581 Secondary hyperparathyroidism of renal origin: Secondary | ICD-10-CM | POA: Diagnosis not present

## 2017-08-20 DIAGNOSIS — D631 Anemia in chronic kidney disease: Secondary | ICD-10-CM | POA: Diagnosis not present

## 2017-08-23 DIAGNOSIS — D631 Anemia in chronic kidney disease: Secondary | ICD-10-CM | POA: Diagnosis not present

## 2017-08-23 DIAGNOSIS — N2581 Secondary hyperparathyroidism of renal origin: Secondary | ICD-10-CM | POA: Diagnosis not present

## 2017-08-23 DIAGNOSIS — N186 End stage renal disease: Secondary | ICD-10-CM | POA: Diagnosis not present

## 2017-08-24 DIAGNOSIS — N186 End stage renal disease: Secondary | ICD-10-CM | POA: Diagnosis not present

## 2017-08-24 DIAGNOSIS — D631 Anemia in chronic kidney disease: Secondary | ICD-10-CM | POA: Diagnosis not present

## 2017-08-24 DIAGNOSIS — N2581 Secondary hyperparathyroidism of renal origin: Secondary | ICD-10-CM | POA: Diagnosis not present

## 2017-08-25 DIAGNOSIS — N2581 Secondary hyperparathyroidism of renal origin: Secondary | ICD-10-CM | POA: Diagnosis not present

## 2017-08-25 DIAGNOSIS — N186 End stage renal disease: Secondary | ICD-10-CM | POA: Diagnosis not present

## 2017-08-25 DIAGNOSIS — D631 Anemia in chronic kidney disease: Secondary | ICD-10-CM | POA: Diagnosis not present

## 2017-08-26 DIAGNOSIS — D631 Anemia in chronic kidney disease: Secondary | ICD-10-CM | POA: Diagnosis not present

## 2017-08-26 DIAGNOSIS — N186 End stage renal disease: Secondary | ICD-10-CM | POA: Diagnosis not present

## 2017-08-26 DIAGNOSIS — N2581 Secondary hyperparathyroidism of renal origin: Secondary | ICD-10-CM | POA: Diagnosis not present

## 2017-08-27 DIAGNOSIS — N186 End stage renal disease: Secondary | ICD-10-CM | POA: Diagnosis not present

## 2017-08-27 DIAGNOSIS — D631 Anemia in chronic kidney disease: Secondary | ICD-10-CM | POA: Diagnosis not present

## 2017-08-27 DIAGNOSIS — N2581 Secondary hyperparathyroidism of renal origin: Secondary | ICD-10-CM | POA: Diagnosis not present

## 2017-08-28 DIAGNOSIS — N186 End stage renal disease: Secondary | ICD-10-CM | POA: Diagnosis not present

## 2017-08-28 DIAGNOSIS — Z992 Dependence on renal dialysis: Secondary | ICD-10-CM | POA: Diagnosis not present

## 2017-08-28 DIAGNOSIS — D631 Anemia in chronic kidney disease: Secondary | ICD-10-CM | POA: Diagnosis not present

## 2017-08-28 DIAGNOSIS — N2581 Secondary hyperparathyroidism of renal origin: Secondary | ICD-10-CM | POA: Diagnosis not present

## 2017-08-29 DIAGNOSIS — N2581 Secondary hyperparathyroidism of renal origin: Secondary | ICD-10-CM | POA: Diagnosis not present

## 2017-08-29 DIAGNOSIS — N186 End stage renal disease: Secondary | ICD-10-CM | POA: Diagnosis not present

## 2017-08-29 DIAGNOSIS — D631 Anemia in chronic kidney disease: Secondary | ICD-10-CM | POA: Diagnosis not present

## 2017-08-30 DIAGNOSIS — N2581 Secondary hyperparathyroidism of renal origin: Secondary | ICD-10-CM | POA: Diagnosis not present

## 2017-08-30 DIAGNOSIS — D631 Anemia in chronic kidney disease: Secondary | ICD-10-CM | POA: Diagnosis not present

## 2017-08-30 DIAGNOSIS — N186 End stage renal disease: Secondary | ICD-10-CM | POA: Diagnosis not present

## 2017-08-31 DIAGNOSIS — N186 End stage renal disease: Secondary | ICD-10-CM | POA: Diagnosis not present

## 2017-08-31 DIAGNOSIS — N2581 Secondary hyperparathyroidism of renal origin: Secondary | ICD-10-CM | POA: Diagnosis not present

## 2017-08-31 DIAGNOSIS — D631 Anemia in chronic kidney disease: Secondary | ICD-10-CM | POA: Diagnosis not present

## 2017-09-01 DIAGNOSIS — N186 End stage renal disease: Secondary | ICD-10-CM | POA: Diagnosis not present

## 2017-09-01 DIAGNOSIS — N2581 Secondary hyperparathyroidism of renal origin: Secondary | ICD-10-CM | POA: Diagnosis not present

## 2017-09-01 DIAGNOSIS — D631 Anemia in chronic kidney disease: Secondary | ICD-10-CM | POA: Diagnosis not present

## 2017-09-02 DIAGNOSIS — N186 End stage renal disease: Secondary | ICD-10-CM | POA: Diagnosis not present

## 2017-09-02 DIAGNOSIS — D631 Anemia in chronic kidney disease: Secondary | ICD-10-CM | POA: Diagnosis not present

## 2017-09-02 DIAGNOSIS — N2581 Secondary hyperparathyroidism of renal origin: Secondary | ICD-10-CM | POA: Diagnosis not present

## 2017-09-03 DIAGNOSIS — D631 Anemia in chronic kidney disease: Secondary | ICD-10-CM | POA: Diagnosis not present

## 2017-09-03 DIAGNOSIS — N186 End stage renal disease: Secondary | ICD-10-CM | POA: Diagnosis not present

## 2017-09-03 DIAGNOSIS — N2581 Secondary hyperparathyroidism of renal origin: Secondary | ICD-10-CM | POA: Diagnosis not present

## 2017-09-04 DIAGNOSIS — D631 Anemia in chronic kidney disease: Secondary | ICD-10-CM | POA: Diagnosis not present

## 2017-09-04 DIAGNOSIS — N2581 Secondary hyperparathyroidism of renal origin: Secondary | ICD-10-CM | POA: Diagnosis not present

## 2017-09-04 DIAGNOSIS — N186 End stage renal disease: Secondary | ICD-10-CM | POA: Diagnosis not present

## 2017-09-05 DIAGNOSIS — D631 Anemia in chronic kidney disease: Secondary | ICD-10-CM | POA: Diagnosis not present

## 2017-09-05 DIAGNOSIS — N2581 Secondary hyperparathyroidism of renal origin: Secondary | ICD-10-CM | POA: Diagnosis not present

## 2017-09-05 DIAGNOSIS — N186 End stage renal disease: Secondary | ICD-10-CM | POA: Diagnosis not present

## 2017-09-06 DIAGNOSIS — N186 End stage renal disease: Secondary | ICD-10-CM | POA: Diagnosis not present

## 2017-09-06 DIAGNOSIS — N2581 Secondary hyperparathyroidism of renal origin: Secondary | ICD-10-CM | POA: Diagnosis not present

## 2017-09-06 DIAGNOSIS — D631 Anemia in chronic kidney disease: Secondary | ICD-10-CM | POA: Diagnosis not present

## 2017-09-07 DIAGNOSIS — D631 Anemia in chronic kidney disease: Secondary | ICD-10-CM | POA: Diagnosis not present

## 2017-09-07 DIAGNOSIS — N2581 Secondary hyperparathyroidism of renal origin: Secondary | ICD-10-CM | POA: Diagnosis not present

## 2017-09-07 DIAGNOSIS — N186 End stage renal disease: Secondary | ICD-10-CM | POA: Diagnosis not present

## 2017-09-08 DIAGNOSIS — D631 Anemia in chronic kidney disease: Secondary | ICD-10-CM | POA: Diagnosis not present

## 2017-09-08 DIAGNOSIS — N186 End stage renal disease: Secondary | ICD-10-CM | POA: Diagnosis not present

## 2017-09-08 DIAGNOSIS — Z4932 Encounter for adequacy testing for peritoneal dialysis: Secondary | ICD-10-CM | POA: Diagnosis not present

## 2017-09-08 DIAGNOSIS — N2581 Secondary hyperparathyroidism of renal origin: Secondary | ICD-10-CM | POA: Diagnosis not present

## 2017-09-09 DIAGNOSIS — N186 End stage renal disease: Secondary | ICD-10-CM | POA: Diagnosis not present

## 2017-09-09 DIAGNOSIS — N2581 Secondary hyperparathyroidism of renal origin: Secondary | ICD-10-CM | POA: Diagnosis not present

## 2017-09-09 DIAGNOSIS — D631 Anemia in chronic kidney disease: Secondary | ICD-10-CM | POA: Diagnosis not present

## 2017-09-10 DIAGNOSIS — N186 End stage renal disease: Secondary | ICD-10-CM | POA: Diagnosis not present

## 2017-09-10 DIAGNOSIS — N2581 Secondary hyperparathyroidism of renal origin: Secondary | ICD-10-CM | POA: Diagnosis not present

## 2017-09-10 DIAGNOSIS — D631 Anemia in chronic kidney disease: Secondary | ICD-10-CM | POA: Diagnosis not present

## 2017-09-11 DIAGNOSIS — N186 End stage renal disease: Secondary | ICD-10-CM | POA: Diagnosis not present

## 2017-09-11 DIAGNOSIS — D631 Anemia in chronic kidney disease: Secondary | ICD-10-CM | POA: Diagnosis not present

## 2017-09-11 DIAGNOSIS — N2581 Secondary hyperparathyroidism of renal origin: Secondary | ICD-10-CM | POA: Diagnosis not present

## 2017-09-12 DIAGNOSIS — N2581 Secondary hyperparathyroidism of renal origin: Secondary | ICD-10-CM | POA: Diagnosis not present

## 2017-09-12 DIAGNOSIS — D631 Anemia in chronic kidney disease: Secondary | ICD-10-CM | POA: Diagnosis not present

## 2017-09-12 DIAGNOSIS — N186 End stage renal disease: Secondary | ICD-10-CM | POA: Diagnosis not present

## 2017-09-13 DIAGNOSIS — N186 End stage renal disease: Secondary | ICD-10-CM | POA: Diagnosis not present

## 2017-09-13 DIAGNOSIS — N2581 Secondary hyperparathyroidism of renal origin: Secondary | ICD-10-CM | POA: Diagnosis not present

## 2017-09-13 DIAGNOSIS — D631 Anemia in chronic kidney disease: Secondary | ICD-10-CM | POA: Diagnosis not present

## 2017-09-14 DIAGNOSIS — D631 Anemia in chronic kidney disease: Secondary | ICD-10-CM | POA: Diagnosis not present

## 2017-09-14 DIAGNOSIS — N2581 Secondary hyperparathyroidism of renal origin: Secondary | ICD-10-CM | POA: Diagnosis not present

## 2017-09-14 DIAGNOSIS — N186 End stage renal disease: Secondary | ICD-10-CM | POA: Diagnosis not present

## 2017-09-15 DIAGNOSIS — N2581 Secondary hyperparathyroidism of renal origin: Secondary | ICD-10-CM | POA: Diagnosis not present

## 2017-09-15 DIAGNOSIS — N186 End stage renal disease: Secondary | ICD-10-CM | POA: Diagnosis not present

## 2017-09-15 DIAGNOSIS — D631 Anemia in chronic kidney disease: Secondary | ICD-10-CM | POA: Diagnosis not present

## 2017-09-16 DIAGNOSIS — N186 End stage renal disease: Secondary | ICD-10-CM | POA: Diagnosis not present

## 2017-09-16 DIAGNOSIS — N2581 Secondary hyperparathyroidism of renal origin: Secondary | ICD-10-CM | POA: Diagnosis not present

## 2017-09-16 DIAGNOSIS — D631 Anemia in chronic kidney disease: Secondary | ICD-10-CM | POA: Diagnosis not present

## 2017-09-17 DIAGNOSIS — N2581 Secondary hyperparathyroidism of renal origin: Secondary | ICD-10-CM | POA: Diagnosis not present

## 2017-09-17 DIAGNOSIS — D631 Anemia in chronic kidney disease: Secondary | ICD-10-CM | POA: Diagnosis not present

## 2017-09-17 DIAGNOSIS — N186 End stage renal disease: Secondary | ICD-10-CM | POA: Diagnosis not present

## 2017-09-18 DIAGNOSIS — N2581 Secondary hyperparathyroidism of renal origin: Secondary | ICD-10-CM | POA: Diagnosis not present

## 2017-09-18 DIAGNOSIS — N186 End stage renal disease: Secondary | ICD-10-CM | POA: Diagnosis not present

## 2017-09-18 DIAGNOSIS — D631 Anemia in chronic kidney disease: Secondary | ICD-10-CM | POA: Diagnosis not present

## 2017-09-19 DIAGNOSIS — N2581 Secondary hyperparathyroidism of renal origin: Secondary | ICD-10-CM | POA: Diagnosis not present

## 2017-09-19 DIAGNOSIS — N186 End stage renal disease: Secondary | ICD-10-CM | POA: Diagnosis not present

## 2017-09-19 DIAGNOSIS — D631 Anemia in chronic kidney disease: Secondary | ICD-10-CM | POA: Diagnosis not present

## 2017-09-20 DIAGNOSIS — Z6837 Body mass index (BMI) 37.0-37.9, adult: Secondary | ICD-10-CM | POA: Diagnosis not present

## 2017-09-20 DIAGNOSIS — N186 End stage renal disease: Secondary | ICD-10-CM | POA: Diagnosis not present

## 2017-09-20 DIAGNOSIS — Z4932 Encounter for adequacy testing for peritoneal dialysis: Secondary | ICD-10-CM | POA: Diagnosis not present

## 2017-09-20 DIAGNOSIS — D631 Anemia in chronic kidney disease: Secondary | ICD-10-CM | POA: Diagnosis not present

## 2017-09-20 DIAGNOSIS — R350 Frequency of micturition: Secondary | ICD-10-CM | POA: Diagnosis not present

## 2017-09-20 DIAGNOSIS — N2581 Secondary hyperparathyroidism of renal origin: Secondary | ICD-10-CM | POA: Diagnosis not present

## 2017-09-20 DIAGNOSIS — F339 Major depressive disorder, recurrent, unspecified: Secondary | ICD-10-CM | POA: Diagnosis not present

## 2017-09-21 DIAGNOSIS — N186 End stage renal disease: Secondary | ICD-10-CM | POA: Diagnosis not present

## 2017-09-21 DIAGNOSIS — N2581 Secondary hyperparathyroidism of renal origin: Secondary | ICD-10-CM | POA: Diagnosis not present

## 2017-09-21 DIAGNOSIS — D631 Anemia in chronic kidney disease: Secondary | ICD-10-CM | POA: Diagnosis not present

## 2017-09-22 DIAGNOSIS — N186 End stage renal disease: Secondary | ICD-10-CM | POA: Diagnosis not present

## 2017-09-22 DIAGNOSIS — D631 Anemia in chronic kidney disease: Secondary | ICD-10-CM | POA: Diagnosis not present

## 2017-09-22 DIAGNOSIS — N2581 Secondary hyperparathyroidism of renal origin: Secondary | ICD-10-CM | POA: Diagnosis not present

## 2017-09-23 DIAGNOSIS — D631 Anemia in chronic kidney disease: Secondary | ICD-10-CM | POA: Diagnosis not present

## 2017-09-23 DIAGNOSIS — N2581 Secondary hyperparathyroidism of renal origin: Secondary | ICD-10-CM | POA: Diagnosis not present

## 2017-09-23 DIAGNOSIS — N186 End stage renal disease: Secondary | ICD-10-CM | POA: Diagnosis not present

## 2017-09-24 DIAGNOSIS — N186 End stage renal disease: Secondary | ICD-10-CM | POA: Diagnosis not present

## 2017-09-24 DIAGNOSIS — D631 Anemia in chronic kidney disease: Secondary | ICD-10-CM | POA: Diagnosis not present

## 2017-09-24 DIAGNOSIS — N2581 Secondary hyperparathyroidism of renal origin: Secondary | ICD-10-CM | POA: Diagnosis not present

## 2017-09-25 DIAGNOSIS — N2581 Secondary hyperparathyroidism of renal origin: Secondary | ICD-10-CM | POA: Diagnosis not present

## 2017-09-25 DIAGNOSIS — N186 End stage renal disease: Secondary | ICD-10-CM | POA: Diagnosis not present

## 2017-09-25 DIAGNOSIS — D631 Anemia in chronic kidney disease: Secondary | ICD-10-CM | POA: Diagnosis not present

## 2017-09-26 DIAGNOSIS — N186 End stage renal disease: Secondary | ICD-10-CM | POA: Diagnosis not present

## 2017-09-27 DIAGNOSIS — N186 End stage renal disease: Secondary | ICD-10-CM | POA: Diagnosis not present

## 2017-09-28 DIAGNOSIS — Z992 Dependence on renal dialysis: Secondary | ICD-10-CM | POA: Diagnosis not present

## 2017-09-28 DIAGNOSIS — N2581 Secondary hyperparathyroidism of renal origin: Secondary | ICD-10-CM | POA: Diagnosis not present

## 2017-09-28 DIAGNOSIS — D631 Anemia in chronic kidney disease: Secondary | ICD-10-CM | POA: Diagnosis not present

## 2017-09-28 DIAGNOSIS — N186 End stage renal disease: Secondary | ICD-10-CM | POA: Diagnosis not present

## 2017-09-29 DIAGNOSIS — N186 End stage renal disease: Secondary | ICD-10-CM | POA: Diagnosis not present

## 2017-09-29 DIAGNOSIS — N2581 Secondary hyperparathyroidism of renal origin: Secondary | ICD-10-CM | POA: Diagnosis not present

## 2017-09-29 DIAGNOSIS — D631 Anemia in chronic kidney disease: Secondary | ICD-10-CM | POA: Diagnosis not present

## 2017-09-30 DIAGNOSIS — N2581 Secondary hyperparathyroidism of renal origin: Secondary | ICD-10-CM | POA: Diagnosis not present

## 2017-09-30 DIAGNOSIS — D631 Anemia in chronic kidney disease: Secondary | ICD-10-CM | POA: Diagnosis not present

## 2017-09-30 DIAGNOSIS — N186 End stage renal disease: Secondary | ICD-10-CM | POA: Diagnosis not present

## 2017-10-01 DIAGNOSIS — N2581 Secondary hyperparathyroidism of renal origin: Secondary | ICD-10-CM | POA: Diagnosis not present

## 2017-10-01 DIAGNOSIS — N186 End stage renal disease: Secondary | ICD-10-CM | POA: Diagnosis not present

## 2017-10-01 DIAGNOSIS — D631 Anemia in chronic kidney disease: Secondary | ICD-10-CM | POA: Diagnosis not present

## 2017-10-02 DIAGNOSIS — N186 End stage renal disease: Secondary | ICD-10-CM | POA: Diagnosis not present

## 2017-10-02 DIAGNOSIS — N2581 Secondary hyperparathyroidism of renal origin: Secondary | ICD-10-CM | POA: Diagnosis not present

## 2017-10-02 DIAGNOSIS — D631 Anemia in chronic kidney disease: Secondary | ICD-10-CM | POA: Diagnosis not present

## 2017-10-03 DIAGNOSIS — D631 Anemia in chronic kidney disease: Secondary | ICD-10-CM | POA: Diagnosis not present

## 2017-10-03 DIAGNOSIS — N2581 Secondary hyperparathyroidism of renal origin: Secondary | ICD-10-CM | POA: Diagnosis not present

## 2017-10-03 DIAGNOSIS — N186 End stage renal disease: Secondary | ICD-10-CM | POA: Diagnosis not present

## 2017-10-04 DIAGNOSIS — D631 Anemia in chronic kidney disease: Secondary | ICD-10-CM | POA: Diagnosis not present

## 2017-10-04 DIAGNOSIS — N2581 Secondary hyperparathyroidism of renal origin: Secondary | ICD-10-CM | POA: Diagnosis not present

## 2017-10-04 DIAGNOSIS — N186 End stage renal disease: Secondary | ICD-10-CM | POA: Diagnosis not present

## 2017-10-05 DIAGNOSIS — N2581 Secondary hyperparathyroidism of renal origin: Secondary | ICD-10-CM | POA: Diagnosis not present

## 2017-10-05 DIAGNOSIS — N186 End stage renal disease: Secondary | ICD-10-CM | POA: Diagnosis not present

## 2017-10-05 DIAGNOSIS — D631 Anemia in chronic kidney disease: Secondary | ICD-10-CM | POA: Diagnosis not present

## 2017-10-06 DIAGNOSIS — N186 End stage renal disease: Secondary | ICD-10-CM | POA: Diagnosis not present

## 2017-10-06 DIAGNOSIS — D631 Anemia in chronic kidney disease: Secondary | ICD-10-CM | POA: Diagnosis not present

## 2017-10-06 DIAGNOSIS — N2581 Secondary hyperparathyroidism of renal origin: Secondary | ICD-10-CM | POA: Diagnosis not present

## 2017-10-07 DIAGNOSIS — N2581 Secondary hyperparathyroidism of renal origin: Secondary | ICD-10-CM | POA: Diagnosis not present

## 2017-10-07 DIAGNOSIS — N186 End stage renal disease: Secondary | ICD-10-CM | POA: Diagnosis not present

## 2017-10-07 DIAGNOSIS — D631 Anemia in chronic kidney disease: Secondary | ICD-10-CM | POA: Diagnosis not present

## 2017-10-08 DIAGNOSIS — N2581 Secondary hyperparathyroidism of renal origin: Secondary | ICD-10-CM | POA: Diagnosis not present

## 2017-10-08 DIAGNOSIS — N186 End stage renal disease: Secondary | ICD-10-CM | POA: Diagnosis not present

## 2017-10-08 DIAGNOSIS — D631 Anemia in chronic kidney disease: Secondary | ICD-10-CM | POA: Diagnosis not present

## 2017-10-09 DIAGNOSIS — N186 End stage renal disease: Secondary | ICD-10-CM | POA: Diagnosis not present

## 2017-10-09 DIAGNOSIS — N2581 Secondary hyperparathyroidism of renal origin: Secondary | ICD-10-CM | POA: Diagnosis not present

## 2017-10-09 DIAGNOSIS — D631 Anemia in chronic kidney disease: Secondary | ICD-10-CM | POA: Diagnosis not present

## 2017-10-10 DIAGNOSIS — N2581 Secondary hyperparathyroidism of renal origin: Secondary | ICD-10-CM | POA: Diagnosis not present

## 2017-10-10 DIAGNOSIS — D631 Anemia in chronic kidney disease: Secondary | ICD-10-CM | POA: Diagnosis not present

## 2017-10-10 DIAGNOSIS — N186 End stage renal disease: Secondary | ICD-10-CM | POA: Diagnosis not present

## 2017-10-11 DIAGNOSIS — R635 Abnormal weight gain: Secondary | ICD-10-CM | POA: Diagnosis not present

## 2017-10-11 DIAGNOSIS — N2581 Secondary hyperparathyroidism of renal origin: Secondary | ICD-10-CM | POA: Diagnosis not present

## 2017-10-11 DIAGNOSIS — N08 Glomerular disorders in diseases classified elsewhere: Secondary | ICD-10-CM | POA: Diagnosis not present

## 2017-10-11 DIAGNOSIS — Z1331 Encounter for screening for depression: Secondary | ICD-10-CM | POA: Diagnosis not present

## 2017-10-11 DIAGNOSIS — N185 Chronic kidney disease, stage 5: Secondary | ICD-10-CM | POA: Diagnosis not present

## 2017-10-11 DIAGNOSIS — R079 Chest pain, unspecified: Secondary | ICD-10-CM | POA: Diagnosis not present

## 2017-10-11 DIAGNOSIS — N186 End stage renal disease: Secondary | ICD-10-CM | POA: Diagnosis not present

## 2017-10-11 DIAGNOSIS — Z1389 Encounter for screening for other disorder: Secondary | ICD-10-CM | POA: Diagnosis not present

## 2017-10-11 DIAGNOSIS — F339 Major depressive disorder, recurrent, unspecified: Secondary | ICD-10-CM | POA: Diagnosis not present

## 2017-10-11 DIAGNOSIS — D631 Anemia in chronic kidney disease: Secondary | ICD-10-CM | POA: Diagnosis not present

## 2017-10-12 DIAGNOSIS — D631 Anemia in chronic kidney disease: Secondary | ICD-10-CM | POA: Diagnosis not present

## 2017-10-12 DIAGNOSIS — N2581 Secondary hyperparathyroidism of renal origin: Secondary | ICD-10-CM | POA: Diagnosis not present

## 2017-10-12 DIAGNOSIS — N186 End stage renal disease: Secondary | ICD-10-CM | POA: Diagnosis not present

## 2017-10-13 DIAGNOSIS — D631 Anemia in chronic kidney disease: Secondary | ICD-10-CM | POA: Diagnosis not present

## 2017-10-13 DIAGNOSIS — N186 End stage renal disease: Secondary | ICD-10-CM | POA: Diagnosis not present

## 2017-10-13 DIAGNOSIS — N2581 Secondary hyperparathyroidism of renal origin: Secondary | ICD-10-CM | POA: Diagnosis not present

## 2017-10-14 DIAGNOSIS — D631 Anemia in chronic kidney disease: Secondary | ICD-10-CM | POA: Diagnosis not present

## 2017-10-14 DIAGNOSIS — N186 End stage renal disease: Secondary | ICD-10-CM | POA: Diagnosis not present

## 2017-10-14 DIAGNOSIS — N2581 Secondary hyperparathyroidism of renal origin: Secondary | ICD-10-CM | POA: Diagnosis not present

## 2017-10-15 DIAGNOSIS — D631 Anemia in chronic kidney disease: Secondary | ICD-10-CM | POA: Diagnosis not present

## 2017-10-15 DIAGNOSIS — N186 End stage renal disease: Secondary | ICD-10-CM | POA: Diagnosis not present

## 2017-10-15 DIAGNOSIS — N2581 Secondary hyperparathyroidism of renal origin: Secondary | ICD-10-CM | POA: Diagnosis not present

## 2017-10-16 DIAGNOSIS — N186 End stage renal disease: Secondary | ICD-10-CM | POA: Diagnosis not present

## 2017-10-16 DIAGNOSIS — D631 Anemia in chronic kidney disease: Secondary | ICD-10-CM | POA: Diagnosis not present

## 2017-10-16 DIAGNOSIS — N2581 Secondary hyperparathyroidism of renal origin: Secondary | ICD-10-CM | POA: Diagnosis not present

## 2017-10-17 DIAGNOSIS — N2581 Secondary hyperparathyroidism of renal origin: Secondary | ICD-10-CM | POA: Diagnosis not present

## 2017-10-17 DIAGNOSIS — D631 Anemia in chronic kidney disease: Secondary | ICD-10-CM | POA: Diagnosis not present

## 2017-10-17 DIAGNOSIS — N186 End stage renal disease: Secondary | ICD-10-CM | POA: Diagnosis not present

## 2017-10-18 DIAGNOSIS — N2581 Secondary hyperparathyroidism of renal origin: Secondary | ICD-10-CM | POA: Diagnosis not present

## 2017-10-18 DIAGNOSIS — D631 Anemia in chronic kidney disease: Secondary | ICD-10-CM | POA: Diagnosis not present

## 2017-10-18 DIAGNOSIS — N186 End stage renal disease: Secondary | ICD-10-CM | POA: Diagnosis not present

## 2017-10-19 DIAGNOSIS — N186 End stage renal disease: Secondary | ICD-10-CM | POA: Diagnosis not present

## 2017-10-19 DIAGNOSIS — N2581 Secondary hyperparathyroidism of renal origin: Secondary | ICD-10-CM | POA: Diagnosis not present

## 2017-10-19 DIAGNOSIS — D631 Anemia in chronic kidney disease: Secondary | ICD-10-CM | POA: Diagnosis not present

## 2017-10-20 DIAGNOSIS — N2581 Secondary hyperparathyroidism of renal origin: Secondary | ICD-10-CM | POA: Diagnosis not present

## 2017-10-20 DIAGNOSIS — N186 End stage renal disease: Secondary | ICD-10-CM | POA: Diagnosis not present

## 2017-10-20 DIAGNOSIS — D631 Anemia in chronic kidney disease: Secondary | ICD-10-CM | POA: Diagnosis not present

## 2017-10-21 DIAGNOSIS — Z992 Dependence on renal dialysis: Secondary | ICD-10-CM | POA: Diagnosis not present

## 2017-10-21 DIAGNOSIS — N2581 Secondary hyperparathyroidism of renal origin: Secondary | ICD-10-CM | POA: Diagnosis not present

## 2017-10-21 DIAGNOSIS — N186 End stage renal disease: Secondary | ICD-10-CM | POA: Diagnosis not present

## 2017-10-21 DIAGNOSIS — K429 Umbilical hernia without obstruction or gangrene: Secondary | ICD-10-CM | POA: Diagnosis not present

## 2017-10-21 DIAGNOSIS — R188 Other ascites: Secondary | ICD-10-CM | POA: Diagnosis not present

## 2017-10-21 DIAGNOSIS — N261 Atrophy of kidney (terminal): Secondary | ICD-10-CM | POA: Diagnosis not present

## 2017-10-21 DIAGNOSIS — R944 Abnormal results of kidney function studies: Secondary | ICD-10-CM | POA: Diagnosis not present

## 2017-10-21 DIAGNOSIS — D631 Anemia in chronic kidney disease: Secondary | ICD-10-CM | POA: Diagnosis not present

## 2017-10-22 DIAGNOSIS — N186 End stage renal disease: Secondary | ICD-10-CM | POA: Diagnosis not present

## 2017-10-22 DIAGNOSIS — D631 Anemia in chronic kidney disease: Secondary | ICD-10-CM | POA: Diagnosis not present

## 2017-10-22 DIAGNOSIS — N2581 Secondary hyperparathyroidism of renal origin: Secondary | ICD-10-CM | POA: Diagnosis not present

## 2017-10-23 DIAGNOSIS — N2581 Secondary hyperparathyroidism of renal origin: Secondary | ICD-10-CM | POA: Diagnosis not present

## 2017-10-23 DIAGNOSIS — N186 End stage renal disease: Secondary | ICD-10-CM | POA: Diagnosis not present

## 2017-10-23 DIAGNOSIS — D631 Anemia in chronic kidney disease: Secondary | ICD-10-CM | POA: Diagnosis not present

## 2017-10-24 DIAGNOSIS — N186 End stage renal disease: Secondary | ICD-10-CM | POA: Diagnosis not present

## 2017-10-24 DIAGNOSIS — D631 Anemia in chronic kidney disease: Secondary | ICD-10-CM | POA: Diagnosis not present

## 2017-10-24 DIAGNOSIS — N2581 Secondary hyperparathyroidism of renal origin: Secondary | ICD-10-CM | POA: Diagnosis not present

## 2017-10-25 DIAGNOSIS — D631 Anemia in chronic kidney disease: Secondary | ICD-10-CM | POA: Diagnosis not present

## 2017-10-25 DIAGNOSIS — N2581 Secondary hyperparathyroidism of renal origin: Secondary | ICD-10-CM | POA: Diagnosis not present

## 2017-10-25 DIAGNOSIS — N186 End stage renal disease: Secondary | ICD-10-CM | POA: Diagnosis not present

## 2017-10-26 DIAGNOSIS — N2581 Secondary hyperparathyroidism of renal origin: Secondary | ICD-10-CM | POA: Diagnosis not present

## 2017-10-26 DIAGNOSIS — N186 End stage renal disease: Secondary | ICD-10-CM | POA: Diagnosis not present

## 2017-10-26 DIAGNOSIS — D631 Anemia in chronic kidney disease: Secondary | ICD-10-CM | POA: Diagnosis not present

## 2017-10-27 DIAGNOSIS — N2581 Secondary hyperparathyroidism of renal origin: Secondary | ICD-10-CM | POA: Diagnosis not present

## 2017-10-27 DIAGNOSIS — N186 End stage renal disease: Secondary | ICD-10-CM | POA: Diagnosis not present

## 2017-10-27 DIAGNOSIS — D631 Anemia in chronic kidney disease: Secondary | ICD-10-CM | POA: Diagnosis not present

## 2017-10-28 DIAGNOSIS — D631 Anemia in chronic kidney disease: Secondary | ICD-10-CM | POA: Diagnosis not present

## 2017-10-28 DIAGNOSIS — N2581 Secondary hyperparathyroidism of renal origin: Secondary | ICD-10-CM | POA: Diagnosis not present

## 2017-10-28 DIAGNOSIS — N186 End stage renal disease: Secondary | ICD-10-CM | POA: Diagnosis not present

## 2017-10-28 NOTE — Progress Notes (Deleted)
Psychiatric Initial Adult Assessment   Patient Identification: Amy Camacho MRN:  [id number] Date of Evaluation:  10/28/2017 Referral Source: *** Chief Complaint:   Visit Diagnosis: No diagnosis found.  History of Present Illness:   Amy Camacho is a 22 y.o. year old female with a history of , who is referred for   Associated Signs/Symptoms: Depression Symptoms:  {DEPRESSION SYMPTOMS:20000} (Hypo) Manic Symptoms:  {BHH MANIC SYMPTOMS:22872} Anxiety Symptoms:  {BHH ANXIETY SYMPTOMS:22873} Psychotic Symptoms:  {BHH PSYCHOTIC SYMPTOMS:22874} PTSD Symptoms: {BHH PTSD SYMPTOMS:22875}  Past Psychiatric History:  Outpatient:  Psychiatry admission:  Previous suicide attempt:  Past trials of medication:  History of violence:   Previous Psychotropic Medications: {YES/NO:21197}  Substance Abuse History in the last 12 months:  {yes no:314532}  Consequences of Substance Abuse: {BHH CONSEQUENCES OF SUBSTANCE ABUSE:22880}  Past Medical History: No past medical history on file. *** The histories are not reviewed yet. Please review them in the "History" navigator section and refresh this [place].  Family Psychiatric History: ***  Family History: No family history on file.  Social History:   Social History   Socioeconomic History  . Marital status: Single    Spouse name: Not on file  . Number of children: Not on file  . Years of education: Not on file  . Highest education level: Not on file  Occupational History  . Not on file  Social Needs  . Financial resource strain: Not on file  . Food insecurity:    Worry: Not on file    Inability: Not on file  . Transportation needs:    Medical: Not on file    Non-medical: Not on file  Tobacco Use  . Smoking status: Not on file  Substance and Sexual Activity  . Alcohol use: Not on file  . Drug use: Not on file  . Sexual activity: Not on file  Lifestyle  . Physical activity:    Days per week: Not on file    Minutes per  session: Not on file  . Stress: Not on file  Relationships  . Social connections:    Talks on phone: Not on file    Gets together: Not on file    Attends religious service: Not on file    Active member of club or organization: Not on file    Attends meetings of clubs or organizations: Not on file    Relationship status: Not on file  Other Topics Concern  . Not on file  Social History Narrative  . Not on file    Additional Social History: ***  Allergies:  Allergies not on file  Metabolic Disorder Labs: No results found for: HGBA1C, MPG No results found for: PROLACTIN No results found for: CHOL, TRIG, HDL, CHOLHDL, VLDL, LDLCALC   Current Medications: No current outpatient medications on file.   No current facility-administered medications for this visit.     Neurologic: Headache: No Seizure: No Paresthesias:No  Musculoskeletal: Strength & Muscle Tone: within normal limits Gait & Station: normal Patient leans: N/A  Psychiatric Specialty Exam: ROS  There were no vitals taken for this visit.There is no height or weight on file to calculate BMI.  General Appearance: Fairly Groomed  Eye Contact:  Good  Speech:  Clear and Coherent  Volume:  Normal  Mood:  {BHH MOOD:22306}  Affect:  {Affect (PAA):22687}  Thought Process:  Coherent  Orientation:  Full (Time, Place, and Person)  Thought Content:  Logical  Suicidal Thoughts:  {ST/HT (PAA):22692}  Homicidal Thoughts:  {ST/HT (PAA):22692}  Memory:  Immediate;   Good  Judgement:  {Judgement (PAA):22694}  Insight:  {Insight (PAA):22695}  Psychomotor Activity:  Normal  Concentration:  Concentration: Good and Attention Span: Good  Recall:  Good  Fund of Knowledge:Good  Language: Good  Akathisia:  No  Handed:  Right  AIMS (if indicated):  N/A  Assets:  Communication Skills Desire for Improvement  ADL's:  Intact  Cognition: WNL  Sleep:  ***   Assessment  Plan  The patient demonstrates the following risk  factors for suicide: Chronic risk factors for suicide include: {Chronic Risk Factors for XBLTJQZ:00923300}. Acute risk factors for suicide include: {Acute Risk Factors for TMAUQJF:35456256}. Protective factors for this patient include: {Protective Factors for Suicide LSLH:73428768}. Considering these factors, the overall suicide risk at this point appears to be {Desc; low/moderate/high:110033}. Patient {ACTION; IS/IS TLX:72620355} appropriate for outpatient follow up.   Treatment Plan Summary: Plan as above   Norman Clay, MD 8/30/201911:48 AM

## 2017-10-29 DIAGNOSIS — N2581 Secondary hyperparathyroidism of renal origin: Secondary | ICD-10-CM | POA: Diagnosis not present

## 2017-10-29 DIAGNOSIS — N186 End stage renal disease: Secondary | ICD-10-CM | POA: Diagnosis not present

## 2017-10-29 DIAGNOSIS — Z992 Dependence on renal dialysis: Secondary | ICD-10-CM | POA: Diagnosis not present

## 2017-10-29 DIAGNOSIS — D631 Anemia in chronic kidney disease: Secondary | ICD-10-CM | POA: Diagnosis not present

## 2017-10-30 DIAGNOSIS — N186 End stage renal disease: Secondary | ICD-10-CM | POA: Diagnosis not present

## 2017-10-30 DIAGNOSIS — N2581 Secondary hyperparathyroidism of renal origin: Secondary | ICD-10-CM | POA: Diagnosis not present

## 2017-10-30 DIAGNOSIS — D631 Anemia in chronic kidney disease: Secondary | ICD-10-CM | POA: Diagnosis not present

## 2017-10-31 DIAGNOSIS — D631 Anemia in chronic kidney disease: Secondary | ICD-10-CM | POA: Diagnosis not present

## 2017-10-31 DIAGNOSIS — N186 End stage renal disease: Secondary | ICD-10-CM | POA: Diagnosis not present

## 2017-10-31 DIAGNOSIS — N2581 Secondary hyperparathyroidism of renal origin: Secondary | ICD-10-CM | POA: Diagnosis not present

## 2017-11-01 DIAGNOSIS — N186 End stage renal disease: Secondary | ICD-10-CM | POA: Diagnosis not present

## 2017-11-01 DIAGNOSIS — N2581 Secondary hyperparathyroidism of renal origin: Secondary | ICD-10-CM | POA: Diagnosis not present

## 2017-11-01 DIAGNOSIS — D631 Anemia in chronic kidney disease: Secondary | ICD-10-CM | POA: Diagnosis not present

## 2017-11-02 DIAGNOSIS — D631 Anemia in chronic kidney disease: Secondary | ICD-10-CM | POA: Diagnosis not present

## 2017-11-02 DIAGNOSIS — N186 End stage renal disease: Secondary | ICD-10-CM | POA: Diagnosis not present

## 2017-11-02 DIAGNOSIS — N2581 Secondary hyperparathyroidism of renal origin: Secondary | ICD-10-CM | POA: Diagnosis not present

## 2017-11-03 ENCOUNTER — Ambulatory Visit (HOSPITAL_COMMUNITY): Payer: Self-pay | Admitting: Psychiatry

## 2017-11-03 DIAGNOSIS — D631 Anemia in chronic kidney disease: Secondary | ICD-10-CM | POA: Diagnosis not present

## 2017-11-03 DIAGNOSIS — N186 End stage renal disease: Secondary | ICD-10-CM | POA: Diagnosis not present

## 2017-11-03 DIAGNOSIS — N2581 Secondary hyperparathyroidism of renal origin: Secondary | ICD-10-CM | POA: Diagnosis not present

## 2017-11-04 DIAGNOSIS — N2581 Secondary hyperparathyroidism of renal origin: Secondary | ICD-10-CM | POA: Diagnosis not present

## 2017-11-04 DIAGNOSIS — D631 Anemia in chronic kidney disease: Secondary | ICD-10-CM | POA: Diagnosis not present

## 2017-11-04 DIAGNOSIS — N186 End stage renal disease: Secondary | ICD-10-CM | POA: Diagnosis not present

## 2017-11-05 DIAGNOSIS — N186 End stage renal disease: Secondary | ICD-10-CM | POA: Diagnosis not present

## 2017-11-05 DIAGNOSIS — N2581 Secondary hyperparathyroidism of renal origin: Secondary | ICD-10-CM | POA: Diagnosis not present

## 2017-11-05 DIAGNOSIS — D631 Anemia in chronic kidney disease: Secondary | ICD-10-CM | POA: Diagnosis not present

## 2017-11-06 DIAGNOSIS — D631 Anemia in chronic kidney disease: Secondary | ICD-10-CM | POA: Diagnosis not present

## 2017-11-06 DIAGNOSIS — N2581 Secondary hyperparathyroidism of renal origin: Secondary | ICD-10-CM | POA: Diagnosis not present

## 2017-11-06 DIAGNOSIS — N186 End stage renal disease: Secondary | ICD-10-CM | POA: Diagnosis not present

## 2017-11-07 DIAGNOSIS — D631 Anemia in chronic kidney disease: Secondary | ICD-10-CM | POA: Diagnosis not present

## 2017-11-07 DIAGNOSIS — N2581 Secondary hyperparathyroidism of renal origin: Secondary | ICD-10-CM | POA: Diagnosis not present

## 2017-11-07 DIAGNOSIS — N186 End stage renal disease: Secondary | ICD-10-CM | POA: Diagnosis not present

## 2017-11-08 DIAGNOSIS — D631 Anemia in chronic kidney disease: Secondary | ICD-10-CM | POA: Diagnosis not present

## 2017-11-08 DIAGNOSIS — N2581 Secondary hyperparathyroidism of renal origin: Secondary | ICD-10-CM | POA: Diagnosis not present

## 2017-11-08 DIAGNOSIS — N186 End stage renal disease: Secondary | ICD-10-CM | POA: Diagnosis not present

## 2017-11-11 DIAGNOSIS — N2581 Secondary hyperparathyroidism of renal origin: Secondary | ICD-10-CM | POA: Diagnosis not present

## 2017-11-11 DIAGNOSIS — N186 End stage renal disease: Secondary | ICD-10-CM | POA: Diagnosis not present

## 2017-11-11 DIAGNOSIS — D631 Anemia in chronic kidney disease: Secondary | ICD-10-CM | POA: Diagnosis not present

## 2017-11-14 DIAGNOSIS — N186 End stage renal disease: Secondary | ICD-10-CM | POA: Diagnosis not present

## 2017-11-14 DIAGNOSIS — N2581 Secondary hyperparathyroidism of renal origin: Secondary | ICD-10-CM | POA: Diagnosis not present

## 2017-11-14 DIAGNOSIS — D631 Anemia in chronic kidney disease: Secondary | ICD-10-CM | POA: Diagnosis not present

## 2017-11-20 DIAGNOSIS — D631 Anemia in chronic kidney disease: Secondary | ICD-10-CM | POA: Diagnosis not present

## 2017-11-20 DIAGNOSIS — N2581 Secondary hyperparathyroidism of renal origin: Secondary | ICD-10-CM | POA: Diagnosis not present

## 2017-11-20 DIAGNOSIS — N186 End stage renal disease: Secondary | ICD-10-CM | POA: Diagnosis not present

## 2017-11-22 DIAGNOSIS — N2581 Secondary hyperparathyroidism of renal origin: Secondary | ICD-10-CM | POA: Diagnosis not present

## 2017-11-22 DIAGNOSIS — N186 End stage renal disease: Secondary | ICD-10-CM | POA: Diagnosis not present

## 2017-11-22 DIAGNOSIS — D631 Anemia in chronic kidney disease: Secondary | ICD-10-CM | POA: Diagnosis not present

## 2017-11-23 DIAGNOSIS — N2581 Secondary hyperparathyroidism of renal origin: Secondary | ICD-10-CM | POA: Diagnosis not present

## 2017-11-23 DIAGNOSIS — N186 End stage renal disease: Secondary | ICD-10-CM | POA: Diagnosis not present

## 2017-11-23 DIAGNOSIS — D631 Anemia in chronic kidney disease: Secondary | ICD-10-CM | POA: Diagnosis not present

## 2017-11-25 DIAGNOSIS — N186 End stage renal disease: Secondary | ICD-10-CM | POA: Diagnosis not present

## 2017-11-25 DIAGNOSIS — N2581 Secondary hyperparathyroidism of renal origin: Secondary | ICD-10-CM | POA: Diagnosis not present

## 2017-11-25 DIAGNOSIS — D631 Anemia in chronic kidney disease: Secondary | ICD-10-CM | POA: Diagnosis not present

## 2017-11-27 DIAGNOSIS — N186 End stage renal disease: Secondary | ICD-10-CM | POA: Diagnosis not present

## 2017-11-27 DIAGNOSIS — N2581 Secondary hyperparathyroidism of renal origin: Secondary | ICD-10-CM | POA: Diagnosis not present

## 2017-11-27 DIAGNOSIS — D631 Anemia in chronic kidney disease: Secondary | ICD-10-CM | POA: Diagnosis not present

## 2017-11-28 DIAGNOSIS — Z992 Dependence on renal dialysis: Secondary | ICD-10-CM | POA: Diagnosis not present

## 2017-11-28 DIAGNOSIS — K469 Unspecified abdominal hernia without obstruction or gangrene: Secondary | ICD-10-CM | POA: Diagnosis not present

## 2017-11-28 DIAGNOSIS — K439 Ventral hernia without obstruction or gangrene: Secondary | ICD-10-CM | POA: Diagnosis not present

## 2017-11-28 DIAGNOSIS — K429 Umbilical hernia without obstruction or gangrene: Secondary | ICD-10-CM | POA: Diagnosis not present

## 2017-11-28 DIAGNOSIS — N186 End stage renal disease: Secondary | ICD-10-CM | POA: Diagnosis not present

## 2017-11-29 DIAGNOSIS — N186 End stage renal disease: Secondary | ICD-10-CM | POA: Diagnosis not present

## 2017-11-29 DIAGNOSIS — D631 Anemia in chronic kidney disease: Secondary | ICD-10-CM | POA: Diagnosis not present

## 2017-11-29 DIAGNOSIS — N2581 Secondary hyperparathyroidism of renal origin: Secondary | ICD-10-CM | POA: Diagnosis not present

## 2017-12-01 DIAGNOSIS — Z791 Long term (current) use of non-steroidal anti-inflammatories (NSAID): Secondary | ICD-10-CM | POA: Diagnosis not present

## 2017-12-01 DIAGNOSIS — Z9689 Presence of other specified functional implants: Secondary | ICD-10-CM | POA: Diagnosis not present

## 2017-12-01 DIAGNOSIS — Z79899 Other long term (current) drug therapy: Secondary | ICD-10-CM | POA: Diagnosis not present

## 2017-12-01 DIAGNOSIS — Z992 Dependence on renal dialysis: Secondary | ICD-10-CM | POA: Diagnosis not present

## 2017-12-01 DIAGNOSIS — R52 Pain, unspecified: Secondary | ICD-10-CM | POA: Diagnosis not present

## 2017-12-01 DIAGNOSIS — N186 End stage renal disease: Secondary | ICD-10-CM | POA: Diagnosis not present

## 2017-12-01 DIAGNOSIS — E669 Obesity, unspecified: Secondary | ICD-10-CM | POA: Diagnosis not present

## 2017-12-01 DIAGNOSIS — K429 Umbilical hernia without obstruction or gangrene: Secondary | ICD-10-CM | POA: Diagnosis not present

## 2017-12-01 DIAGNOSIS — K439 Ventral hernia without obstruction or gangrene: Secondary | ICD-10-CM | POA: Diagnosis not present

## 2017-12-01 DIAGNOSIS — J45909 Unspecified asthma, uncomplicated: Secondary | ICD-10-CM | POA: Diagnosis not present

## 2017-12-01 DIAGNOSIS — D631 Anemia in chronic kidney disease: Secondary | ICD-10-CM | POA: Diagnosis not present

## 2017-12-01 DIAGNOSIS — I120 Hypertensive chronic kidney disease with stage 5 chronic kidney disease or end stage renal disease: Secondary | ICD-10-CM | POA: Diagnosis not present

## 2017-12-01 DIAGNOSIS — Z6838 Body mass index (BMI) 38.0-38.9, adult: Secondary | ICD-10-CM | POA: Diagnosis not present

## 2017-12-01 DIAGNOSIS — Z4932 Encounter for adequacy testing for peritoneal dialysis: Secondary | ICD-10-CM | POA: Diagnosis not present

## 2017-12-01 DIAGNOSIS — N2581 Secondary hyperparathyroidism of renal origin: Secondary | ICD-10-CM | POA: Diagnosis not present

## 2017-12-04 DIAGNOSIS — N186 End stage renal disease: Secondary | ICD-10-CM | POA: Diagnosis not present

## 2017-12-04 DIAGNOSIS — N2581 Secondary hyperparathyroidism of renal origin: Secondary | ICD-10-CM | POA: Diagnosis not present

## 2017-12-04 DIAGNOSIS — D631 Anemia in chronic kidney disease: Secondary | ICD-10-CM | POA: Diagnosis not present

## 2017-12-05 DIAGNOSIS — N2581 Secondary hyperparathyroidism of renal origin: Secondary | ICD-10-CM | POA: Diagnosis not present

## 2017-12-05 DIAGNOSIS — N186 End stage renal disease: Secondary | ICD-10-CM | POA: Diagnosis not present

## 2017-12-05 DIAGNOSIS — Z4932 Encounter for adequacy testing for peritoneal dialysis: Secondary | ICD-10-CM | POA: Diagnosis not present

## 2017-12-05 DIAGNOSIS — D631 Anemia in chronic kidney disease: Secondary | ICD-10-CM | POA: Diagnosis not present

## 2017-12-06 DIAGNOSIS — N2581 Secondary hyperparathyroidism of renal origin: Secondary | ICD-10-CM | POA: Diagnosis not present

## 2017-12-06 DIAGNOSIS — D631 Anemia in chronic kidney disease: Secondary | ICD-10-CM | POA: Diagnosis not present

## 2017-12-06 DIAGNOSIS — N186 End stage renal disease: Secondary | ICD-10-CM | POA: Diagnosis not present

## 2017-12-07 DIAGNOSIS — N2581 Secondary hyperparathyroidism of renal origin: Secondary | ICD-10-CM | POA: Diagnosis not present

## 2017-12-07 DIAGNOSIS — D631 Anemia in chronic kidney disease: Secondary | ICD-10-CM | POA: Diagnosis not present

## 2017-12-07 DIAGNOSIS — N186 End stage renal disease: Secondary | ICD-10-CM | POA: Diagnosis not present

## 2017-12-08 DIAGNOSIS — J45909 Unspecified asthma, uncomplicated: Secondary | ICD-10-CM | POA: Diagnosis not present

## 2017-12-08 DIAGNOSIS — K439 Ventral hernia without obstruction or gangrene: Secondary | ICD-10-CM | POA: Diagnosis not present

## 2017-12-08 DIAGNOSIS — N186 End stage renal disease: Secondary | ICD-10-CM | POA: Diagnosis not present

## 2017-12-08 DIAGNOSIS — I120 Hypertensive chronic kidney disease with stage 5 chronic kidney disease or end stage renal disease: Secondary | ICD-10-CM | POA: Diagnosis not present

## 2017-12-08 DIAGNOSIS — Z992 Dependence on renal dialysis: Secondary | ICD-10-CM | POA: Diagnosis not present

## 2017-12-08 DIAGNOSIS — K429 Umbilical hernia without obstruction or gangrene: Secondary | ICD-10-CM | POA: Diagnosis not present

## 2017-12-09 DIAGNOSIS — N186 End stage renal disease: Secondary | ICD-10-CM | POA: Diagnosis not present

## 2017-12-09 DIAGNOSIS — N2581 Secondary hyperparathyroidism of renal origin: Secondary | ICD-10-CM | POA: Diagnosis not present

## 2017-12-09 DIAGNOSIS — D631 Anemia in chronic kidney disease: Secondary | ICD-10-CM | POA: Diagnosis not present

## 2017-12-09 DIAGNOSIS — Z992 Dependence on renal dialysis: Secondary | ICD-10-CM | POA: Diagnosis not present

## 2017-12-10 DIAGNOSIS — D631 Anemia in chronic kidney disease: Secondary | ICD-10-CM | POA: Diagnosis not present

## 2017-12-10 DIAGNOSIS — N186 End stage renal disease: Secondary | ICD-10-CM | POA: Diagnosis not present

## 2017-12-10 DIAGNOSIS — Z992 Dependence on renal dialysis: Secondary | ICD-10-CM | POA: Diagnosis not present

## 2017-12-10 DIAGNOSIS — N2581 Secondary hyperparathyroidism of renal origin: Secondary | ICD-10-CM | POA: Diagnosis not present

## 2017-12-11 DIAGNOSIS — N2581 Secondary hyperparathyroidism of renal origin: Secondary | ICD-10-CM | POA: Diagnosis not present

## 2017-12-11 DIAGNOSIS — D631 Anemia in chronic kidney disease: Secondary | ICD-10-CM | POA: Diagnosis not present

## 2017-12-11 DIAGNOSIS — Z992 Dependence on renal dialysis: Secondary | ICD-10-CM | POA: Diagnosis not present

## 2017-12-11 DIAGNOSIS — N186 End stage renal disease: Secondary | ICD-10-CM | POA: Diagnosis not present

## 2017-12-12 DIAGNOSIS — N186 End stage renal disease: Secondary | ICD-10-CM | POA: Diagnosis not present

## 2017-12-12 DIAGNOSIS — Z992 Dependence on renal dialysis: Secondary | ICD-10-CM | POA: Diagnosis not present

## 2017-12-13 DIAGNOSIS — N186 End stage renal disease: Secondary | ICD-10-CM | POA: Diagnosis not present

## 2017-12-13 DIAGNOSIS — N2581 Secondary hyperparathyroidism of renal origin: Secondary | ICD-10-CM | POA: Diagnosis not present

## 2017-12-13 DIAGNOSIS — D631 Anemia in chronic kidney disease: Secondary | ICD-10-CM | POA: Diagnosis not present

## 2017-12-13 DIAGNOSIS — Z992 Dependence on renal dialysis: Secondary | ICD-10-CM | POA: Diagnosis not present

## 2017-12-14 DIAGNOSIS — N186 End stage renal disease: Secondary | ICD-10-CM | POA: Diagnosis not present

## 2017-12-14 DIAGNOSIS — Z992 Dependence on renal dialysis: Secondary | ICD-10-CM | POA: Diagnosis not present

## 2017-12-15 DIAGNOSIS — N2581 Secondary hyperparathyroidism of renal origin: Secondary | ICD-10-CM | POA: Diagnosis not present

## 2017-12-15 DIAGNOSIS — Z992 Dependence on renal dialysis: Secondary | ICD-10-CM | POA: Diagnosis not present

## 2017-12-15 DIAGNOSIS — N186 End stage renal disease: Secondary | ICD-10-CM | POA: Diagnosis not present

## 2017-12-15 DIAGNOSIS — D631 Anemia in chronic kidney disease: Secondary | ICD-10-CM | POA: Diagnosis not present

## 2017-12-16 DIAGNOSIS — N186 End stage renal disease: Secondary | ICD-10-CM | POA: Diagnosis not present

## 2017-12-16 DIAGNOSIS — Z992 Dependence on renal dialysis: Secondary | ICD-10-CM | POA: Diagnosis not present

## 2017-12-17 DIAGNOSIS — Z992 Dependence on renal dialysis: Secondary | ICD-10-CM | POA: Diagnosis not present

## 2017-12-17 DIAGNOSIS — N186 End stage renal disease: Secondary | ICD-10-CM | POA: Diagnosis not present

## 2017-12-18 DIAGNOSIS — N2581 Secondary hyperparathyroidism of renal origin: Secondary | ICD-10-CM | POA: Diagnosis not present

## 2017-12-18 DIAGNOSIS — Z992 Dependence on renal dialysis: Secondary | ICD-10-CM | POA: Diagnosis not present

## 2017-12-18 DIAGNOSIS — N186 End stage renal disease: Secondary | ICD-10-CM | POA: Diagnosis not present

## 2017-12-18 DIAGNOSIS — D631 Anemia in chronic kidney disease: Secondary | ICD-10-CM | POA: Diagnosis not present

## 2017-12-19 DIAGNOSIS — N2581 Secondary hyperparathyroidism of renal origin: Secondary | ICD-10-CM | POA: Diagnosis not present

## 2017-12-19 DIAGNOSIS — N186 End stage renal disease: Secondary | ICD-10-CM | POA: Diagnosis not present

## 2017-12-19 DIAGNOSIS — D631 Anemia in chronic kidney disease: Secondary | ICD-10-CM | POA: Diagnosis not present

## 2017-12-19 DIAGNOSIS — Z992 Dependence on renal dialysis: Secondary | ICD-10-CM | POA: Diagnosis not present

## 2017-12-20 DIAGNOSIS — N186 End stage renal disease: Secondary | ICD-10-CM | POA: Diagnosis not present

## 2017-12-20 DIAGNOSIS — Z992 Dependence on renal dialysis: Secondary | ICD-10-CM | POA: Diagnosis not present

## 2017-12-21 DIAGNOSIS — Z992 Dependence on renal dialysis: Secondary | ICD-10-CM | POA: Diagnosis not present

## 2017-12-21 DIAGNOSIS — N186 End stage renal disease: Secondary | ICD-10-CM | POA: Diagnosis not present

## 2017-12-22 DIAGNOSIS — N2581 Secondary hyperparathyroidism of renal origin: Secondary | ICD-10-CM | POA: Diagnosis not present

## 2017-12-22 DIAGNOSIS — N186 End stage renal disease: Secondary | ICD-10-CM | POA: Diagnosis not present

## 2017-12-22 DIAGNOSIS — Z992 Dependence on renal dialysis: Secondary | ICD-10-CM | POA: Diagnosis not present

## 2017-12-22 DIAGNOSIS — D631 Anemia in chronic kidney disease: Secondary | ICD-10-CM | POA: Diagnosis not present

## 2017-12-23 DIAGNOSIS — N186 End stage renal disease: Secondary | ICD-10-CM | POA: Diagnosis not present

## 2017-12-23 DIAGNOSIS — Z992 Dependence on renal dialysis: Secondary | ICD-10-CM | POA: Diagnosis not present

## 2017-12-24 DIAGNOSIS — N186 End stage renal disease: Secondary | ICD-10-CM | POA: Diagnosis not present

## 2017-12-24 DIAGNOSIS — Z992 Dependence on renal dialysis: Secondary | ICD-10-CM | POA: Diagnosis not present

## 2017-12-25 DIAGNOSIS — N2581 Secondary hyperparathyroidism of renal origin: Secondary | ICD-10-CM | POA: Diagnosis not present

## 2017-12-25 DIAGNOSIS — D631 Anemia in chronic kidney disease: Secondary | ICD-10-CM | POA: Diagnosis not present

## 2017-12-25 DIAGNOSIS — Z992 Dependence on renal dialysis: Secondary | ICD-10-CM | POA: Diagnosis not present

## 2017-12-25 DIAGNOSIS — N186 End stage renal disease: Secondary | ICD-10-CM | POA: Diagnosis not present

## 2017-12-26 DIAGNOSIS — N186 End stage renal disease: Secondary | ICD-10-CM | POA: Diagnosis not present

## 2017-12-26 DIAGNOSIS — Z992 Dependence on renal dialysis: Secondary | ICD-10-CM | POA: Diagnosis not present

## 2017-12-27 DIAGNOSIS — N186 End stage renal disease: Secondary | ICD-10-CM | POA: Diagnosis not present

## 2017-12-27 DIAGNOSIS — Z992 Dependence on renal dialysis: Secondary | ICD-10-CM | POA: Diagnosis not present

## 2017-12-28 DIAGNOSIS — N186 End stage renal disease: Secondary | ICD-10-CM | POA: Diagnosis not present

## 2017-12-28 DIAGNOSIS — Z992 Dependence on renal dialysis: Secondary | ICD-10-CM | POA: Diagnosis not present

## 2017-12-28 DIAGNOSIS — N2581 Secondary hyperparathyroidism of renal origin: Secondary | ICD-10-CM | POA: Diagnosis not present

## 2017-12-28 DIAGNOSIS — D631 Anemia in chronic kidney disease: Secondary | ICD-10-CM | POA: Diagnosis not present

## 2017-12-29 DIAGNOSIS — N186 End stage renal disease: Secondary | ICD-10-CM | POA: Diagnosis not present

## 2017-12-29 DIAGNOSIS — Z992 Dependence on renal dialysis: Secondary | ICD-10-CM | POA: Diagnosis not present

## 2018-01-04 DIAGNOSIS — D509 Iron deficiency anemia, unspecified: Secondary | ICD-10-CM | POA: Diagnosis not present

## 2018-01-04 DIAGNOSIS — D631 Anemia in chronic kidney disease: Secondary | ICD-10-CM | POA: Diagnosis not present

## 2018-01-04 DIAGNOSIS — N186 End stage renal disease: Secondary | ICD-10-CM | POA: Diagnosis not present

## 2018-01-04 DIAGNOSIS — N2581 Secondary hyperparathyroidism of renal origin: Secondary | ICD-10-CM | POA: Diagnosis not present

## 2018-01-05 DIAGNOSIS — D631 Anemia in chronic kidney disease: Secondary | ICD-10-CM | POA: Diagnosis not present

## 2018-01-05 DIAGNOSIS — N2581 Secondary hyperparathyroidism of renal origin: Secondary | ICD-10-CM | POA: Diagnosis not present

## 2018-01-05 DIAGNOSIS — D509 Iron deficiency anemia, unspecified: Secondary | ICD-10-CM | POA: Diagnosis not present

## 2018-01-05 DIAGNOSIS — N186 End stage renal disease: Secondary | ICD-10-CM | POA: Diagnosis not present

## 2018-01-06 DIAGNOSIS — D509 Iron deficiency anemia, unspecified: Secondary | ICD-10-CM | POA: Diagnosis not present

## 2018-01-06 DIAGNOSIS — N186 End stage renal disease: Secondary | ICD-10-CM | POA: Diagnosis not present

## 2018-01-06 DIAGNOSIS — N2581 Secondary hyperparathyroidism of renal origin: Secondary | ICD-10-CM | POA: Diagnosis not present

## 2018-01-06 DIAGNOSIS — D631 Anemia in chronic kidney disease: Secondary | ICD-10-CM | POA: Diagnosis not present

## 2018-01-09 DIAGNOSIS — N2581 Secondary hyperparathyroidism of renal origin: Secondary | ICD-10-CM | POA: Diagnosis not present

## 2018-01-09 DIAGNOSIS — D631 Anemia in chronic kidney disease: Secondary | ICD-10-CM | POA: Diagnosis not present

## 2018-01-09 DIAGNOSIS — N186 End stage renal disease: Secondary | ICD-10-CM | POA: Diagnosis not present

## 2018-01-09 DIAGNOSIS — D509 Iron deficiency anemia, unspecified: Secondary | ICD-10-CM | POA: Diagnosis not present

## 2018-01-11 DIAGNOSIS — D631 Anemia in chronic kidney disease: Secondary | ICD-10-CM | POA: Diagnosis not present

## 2018-01-11 DIAGNOSIS — N2581 Secondary hyperparathyroidism of renal origin: Secondary | ICD-10-CM | POA: Diagnosis not present

## 2018-01-11 DIAGNOSIS — N186 End stage renal disease: Secondary | ICD-10-CM | POA: Diagnosis not present

## 2018-01-11 DIAGNOSIS — D509 Iron deficiency anemia, unspecified: Secondary | ICD-10-CM | POA: Diagnosis not present

## 2018-01-12 DIAGNOSIS — D631 Anemia in chronic kidney disease: Secondary | ICD-10-CM | POA: Diagnosis not present

## 2018-01-12 DIAGNOSIS — D509 Iron deficiency anemia, unspecified: Secondary | ICD-10-CM | POA: Diagnosis not present

## 2018-01-12 DIAGNOSIS — N2581 Secondary hyperparathyroidism of renal origin: Secondary | ICD-10-CM | POA: Diagnosis not present

## 2018-01-12 DIAGNOSIS — N186 End stage renal disease: Secondary | ICD-10-CM | POA: Diagnosis not present

## 2018-01-15 DIAGNOSIS — D631 Anemia in chronic kidney disease: Secondary | ICD-10-CM | POA: Diagnosis not present

## 2018-01-15 DIAGNOSIS — N186 End stage renal disease: Secondary | ICD-10-CM | POA: Diagnosis not present

## 2018-01-15 DIAGNOSIS — N2581 Secondary hyperparathyroidism of renal origin: Secondary | ICD-10-CM | POA: Diagnosis not present

## 2018-01-15 DIAGNOSIS — D509 Iron deficiency anemia, unspecified: Secondary | ICD-10-CM | POA: Diagnosis not present

## 2018-01-16 DIAGNOSIS — N186 End stage renal disease: Secondary | ICD-10-CM | POA: Diagnosis not present

## 2018-01-16 DIAGNOSIS — D509 Iron deficiency anemia, unspecified: Secondary | ICD-10-CM | POA: Diagnosis not present

## 2018-01-16 DIAGNOSIS — D631 Anemia in chronic kidney disease: Secondary | ICD-10-CM | POA: Diagnosis not present

## 2018-01-16 DIAGNOSIS — N2581 Secondary hyperparathyroidism of renal origin: Secondary | ICD-10-CM | POA: Diagnosis not present

## 2018-01-18 DIAGNOSIS — D631 Anemia in chronic kidney disease: Secondary | ICD-10-CM | POA: Diagnosis not present

## 2018-01-18 DIAGNOSIS — N186 End stage renal disease: Secondary | ICD-10-CM | POA: Diagnosis not present

## 2018-01-18 DIAGNOSIS — N2581 Secondary hyperparathyroidism of renal origin: Secondary | ICD-10-CM | POA: Diagnosis not present

## 2018-01-18 DIAGNOSIS — D509 Iron deficiency anemia, unspecified: Secondary | ICD-10-CM | POA: Diagnosis not present

## 2018-01-25 DIAGNOSIS — N2581 Secondary hyperparathyroidism of renal origin: Secondary | ICD-10-CM | POA: Diagnosis not present

## 2018-01-25 DIAGNOSIS — D509 Iron deficiency anemia, unspecified: Secondary | ICD-10-CM | POA: Diagnosis not present

## 2018-01-25 DIAGNOSIS — D631 Anemia in chronic kidney disease: Secondary | ICD-10-CM | POA: Diagnosis not present

## 2018-01-25 DIAGNOSIS — N186 End stage renal disease: Secondary | ICD-10-CM | POA: Diagnosis not present

## 2018-01-26 DIAGNOSIS — N2581 Secondary hyperparathyroidism of renal origin: Secondary | ICD-10-CM | POA: Diagnosis not present

## 2018-01-26 DIAGNOSIS — D509 Iron deficiency anemia, unspecified: Secondary | ICD-10-CM | POA: Diagnosis not present

## 2018-01-26 DIAGNOSIS — D631 Anemia in chronic kidney disease: Secondary | ICD-10-CM | POA: Diagnosis not present

## 2018-01-26 DIAGNOSIS — N186 End stage renal disease: Secondary | ICD-10-CM | POA: Diagnosis not present

## 2018-01-28 DIAGNOSIS — N186 End stage renal disease: Secondary | ICD-10-CM | POA: Diagnosis not present

## 2018-01-28 DIAGNOSIS — Z992 Dependence on renal dialysis: Secondary | ICD-10-CM | POA: Diagnosis not present

## 2018-01-29 DIAGNOSIS — N2581 Secondary hyperparathyroidism of renal origin: Secondary | ICD-10-CM | POA: Diagnosis not present

## 2018-01-29 DIAGNOSIS — N186 End stage renal disease: Secondary | ICD-10-CM | POA: Diagnosis not present

## 2018-01-29 DIAGNOSIS — D631 Anemia in chronic kidney disease: Secondary | ICD-10-CM | POA: Diagnosis not present

## 2018-02-01 DIAGNOSIS — N2581 Secondary hyperparathyroidism of renal origin: Secondary | ICD-10-CM | POA: Diagnosis not present

## 2018-02-01 DIAGNOSIS — N186 End stage renal disease: Secondary | ICD-10-CM | POA: Diagnosis not present

## 2018-02-01 DIAGNOSIS — D631 Anemia in chronic kidney disease: Secondary | ICD-10-CM | POA: Diagnosis not present

## 2018-02-07 DIAGNOSIS — D631 Anemia in chronic kidney disease: Secondary | ICD-10-CM | POA: Diagnosis not present

## 2018-02-07 DIAGNOSIS — N186 End stage renal disease: Secondary | ICD-10-CM | POA: Diagnosis not present

## 2018-02-07 DIAGNOSIS — N2581 Secondary hyperparathyroidism of renal origin: Secondary | ICD-10-CM | POA: Diagnosis not present

## 2018-02-08 DIAGNOSIS — D631 Anemia in chronic kidney disease: Secondary | ICD-10-CM | POA: Diagnosis not present

## 2018-02-08 DIAGNOSIS — N186 End stage renal disease: Secondary | ICD-10-CM | POA: Diagnosis not present

## 2018-02-08 DIAGNOSIS — N2581 Secondary hyperparathyroidism of renal origin: Secondary | ICD-10-CM | POA: Diagnosis not present

## 2018-02-12 DIAGNOSIS — N2581 Secondary hyperparathyroidism of renal origin: Secondary | ICD-10-CM | POA: Diagnosis not present

## 2018-02-12 DIAGNOSIS — N186 End stage renal disease: Secondary | ICD-10-CM | POA: Diagnosis not present

## 2018-02-12 DIAGNOSIS — D631 Anemia in chronic kidney disease: Secondary | ICD-10-CM | POA: Diagnosis not present

## 2018-02-25 DIAGNOSIS — N2581 Secondary hyperparathyroidism of renal origin: Secondary | ICD-10-CM | POA: Diagnosis not present

## 2018-02-25 DIAGNOSIS — N186 End stage renal disease: Secondary | ICD-10-CM | POA: Diagnosis not present

## 2018-02-25 DIAGNOSIS — D631 Anemia in chronic kidney disease: Secondary | ICD-10-CM | POA: Diagnosis not present

## 2018-02-28 DIAGNOSIS — N186 End stage renal disease: Secondary | ICD-10-CM | POA: Diagnosis not present

## 2018-02-28 DIAGNOSIS — Z992 Dependence on renal dialysis: Secondary | ICD-10-CM | POA: Diagnosis not present

## 2018-03-01 DIAGNOSIS — D509 Iron deficiency anemia, unspecified: Secondary | ICD-10-CM | POA: Diagnosis not present

## 2018-03-01 DIAGNOSIS — I10 Essential (primary) hypertension: Secondary | ICD-10-CM | POA: Diagnosis not present

## 2018-03-01 DIAGNOSIS — N186 End stage renal disease: Secondary | ICD-10-CM | POA: Diagnosis not present

## 2018-03-01 DIAGNOSIS — D631 Anemia in chronic kidney disease: Secondary | ICD-10-CM | POA: Diagnosis not present

## 2018-03-01 DIAGNOSIS — N2581 Secondary hyperparathyroidism of renal origin: Secondary | ICD-10-CM | POA: Diagnosis not present

## 2018-03-06 DIAGNOSIS — I10 Essential (primary) hypertension: Secondary | ICD-10-CM | POA: Diagnosis not present

## 2018-03-06 DIAGNOSIS — N186 End stage renal disease: Secondary | ICD-10-CM | POA: Diagnosis not present

## 2018-03-06 DIAGNOSIS — D509 Iron deficiency anemia, unspecified: Secondary | ICD-10-CM | POA: Diagnosis not present

## 2018-03-06 DIAGNOSIS — D631 Anemia in chronic kidney disease: Secondary | ICD-10-CM | POA: Diagnosis not present

## 2018-03-06 DIAGNOSIS — N2581 Secondary hyperparathyroidism of renal origin: Secondary | ICD-10-CM | POA: Diagnosis not present

## 2018-03-07 DIAGNOSIS — I10 Essential (primary) hypertension: Secondary | ICD-10-CM | POA: Diagnosis not present

## 2018-03-07 DIAGNOSIS — D509 Iron deficiency anemia, unspecified: Secondary | ICD-10-CM | POA: Diagnosis not present

## 2018-03-07 DIAGNOSIS — N2581 Secondary hyperparathyroidism of renal origin: Secondary | ICD-10-CM | POA: Diagnosis not present

## 2018-03-07 DIAGNOSIS — D631 Anemia in chronic kidney disease: Secondary | ICD-10-CM | POA: Diagnosis not present

## 2018-03-07 DIAGNOSIS — N186 End stage renal disease: Secondary | ICD-10-CM | POA: Diagnosis not present

## 2018-03-08 DIAGNOSIS — N186 End stage renal disease: Secondary | ICD-10-CM | POA: Diagnosis not present

## 2018-03-08 DIAGNOSIS — D509 Iron deficiency anemia, unspecified: Secondary | ICD-10-CM | POA: Diagnosis not present

## 2018-03-08 DIAGNOSIS — D631 Anemia in chronic kidney disease: Secondary | ICD-10-CM | POA: Diagnosis not present

## 2018-03-08 DIAGNOSIS — N2581 Secondary hyperparathyroidism of renal origin: Secondary | ICD-10-CM | POA: Diagnosis not present

## 2018-03-08 DIAGNOSIS — I10 Essential (primary) hypertension: Secondary | ICD-10-CM | POA: Diagnosis not present

## 2018-03-09 DIAGNOSIS — N2581 Secondary hyperparathyroidism of renal origin: Secondary | ICD-10-CM | POA: Diagnosis not present

## 2018-03-09 DIAGNOSIS — I10 Essential (primary) hypertension: Secondary | ICD-10-CM | POA: Diagnosis not present

## 2018-03-09 DIAGNOSIS — D631 Anemia in chronic kidney disease: Secondary | ICD-10-CM | POA: Diagnosis not present

## 2018-03-09 DIAGNOSIS — Z4932 Encounter for adequacy testing for peritoneal dialysis: Secondary | ICD-10-CM | POA: Diagnosis not present

## 2018-03-09 DIAGNOSIS — D509 Iron deficiency anemia, unspecified: Secondary | ICD-10-CM | POA: Diagnosis not present

## 2018-03-09 DIAGNOSIS — N186 End stage renal disease: Secondary | ICD-10-CM | POA: Diagnosis not present

## 2018-03-10 DIAGNOSIS — D509 Iron deficiency anemia, unspecified: Secondary | ICD-10-CM | POA: Diagnosis not present

## 2018-03-10 DIAGNOSIS — N2581 Secondary hyperparathyroidism of renal origin: Secondary | ICD-10-CM | POA: Diagnosis not present

## 2018-03-10 DIAGNOSIS — D631 Anemia in chronic kidney disease: Secondary | ICD-10-CM | POA: Diagnosis not present

## 2018-03-10 DIAGNOSIS — I10 Essential (primary) hypertension: Secondary | ICD-10-CM | POA: Diagnosis not present

## 2018-03-10 DIAGNOSIS — N186 End stage renal disease: Secondary | ICD-10-CM | POA: Diagnosis not present

## 2018-03-11 DIAGNOSIS — D631 Anemia in chronic kidney disease: Secondary | ICD-10-CM | POA: Diagnosis not present

## 2018-03-11 DIAGNOSIS — I10 Essential (primary) hypertension: Secondary | ICD-10-CM | POA: Diagnosis not present

## 2018-03-11 DIAGNOSIS — N186 End stage renal disease: Secondary | ICD-10-CM | POA: Diagnosis not present

## 2018-03-11 DIAGNOSIS — D509 Iron deficiency anemia, unspecified: Secondary | ICD-10-CM | POA: Diagnosis not present

## 2018-03-11 DIAGNOSIS — N2581 Secondary hyperparathyroidism of renal origin: Secondary | ICD-10-CM | POA: Diagnosis not present

## 2018-03-12 DIAGNOSIS — N2581 Secondary hyperparathyroidism of renal origin: Secondary | ICD-10-CM | POA: Diagnosis not present

## 2018-03-12 DIAGNOSIS — N186 End stage renal disease: Secondary | ICD-10-CM | POA: Diagnosis not present

## 2018-03-12 DIAGNOSIS — I10 Essential (primary) hypertension: Secondary | ICD-10-CM | POA: Diagnosis not present

## 2018-03-12 DIAGNOSIS — D509 Iron deficiency anemia, unspecified: Secondary | ICD-10-CM | POA: Diagnosis not present

## 2018-03-12 DIAGNOSIS — D631 Anemia in chronic kidney disease: Secondary | ICD-10-CM | POA: Diagnosis not present

## 2018-03-13 DIAGNOSIS — N2581 Secondary hyperparathyroidism of renal origin: Secondary | ICD-10-CM | POA: Diagnosis not present

## 2018-03-13 DIAGNOSIS — I10 Essential (primary) hypertension: Secondary | ICD-10-CM | POA: Diagnosis not present

## 2018-03-13 DIAGNOSIS — D509 Iron deficiency anemia, unspecified: Secondary | ICD-10-CM | POA: Diagnosis not present

## 2018-03-13 DIAGNOSIS — D631 Anemia in chronic kidney disease: Secondary | ICD-10-CM | POA: Diagnosis not present

## 2018-03-13 DIAGNOSIS — N186 End stage renal disease: Secondary | ICD-10-CM | POA: Diagnosis not present

## 2018-03-14 DIAGNOSIS — N2581 Secondary hyperparathyroidism of renal origin: Secondary | ICD-10-CM | POA: Diagnosis not present

## 2018-03-14 DIAGNOSIS — I10 Essential (primary) hypertension: Secondary | ICD-10-CM | POA: Diagnosis not present

## 2018-03-14 DIAGNOSIS — D509 Iron deficiency anemia, unspecified: Secondary | ICD-10-CM | POA: Diagnosis not present

## 2018-03-14 DIAGNOSIS — D631 Anemia in chronic kidney disease: Secondary | ICD-10-CM | POA: Diagnosis not present

## 2018-03-14 DIAGNOSIS — N186 End stage renal disease: Secondary | ICD-10-CM | POA: Diagnosis not present

## 2018-03-14 DIAGNOSIS — Z4932 Encounter for adequacy testing for peritoneal dialysis: Secondary | ICD-10-CM | POA: Diagnosis not present

## 2018-03-15 DIAGNOSIS — I10 Essential (primary) hypertension: Secondary | ICD-10-CM | POA: Diagnosis not present

## 2018-03-15 DIAGNOSIS — D631 Anemia in chronic kidney disease: Secondary | ICD-10-CM | POA: Diagnosis not present

## 2018-03-15 DIAGNOSIS — N2581 Secondary hyperparathyroidism of renal origin: Secondary | ICD-10-CM | POA: Diagnosis not present

## 2018-03-15 DIAGNOSIS — D509 Iron deficiency anemia, unspecified: Secondary | ICD-10-CM | POA: Diagnosis not present

## 2018-03-15 DIAGNOSIS — N186 End stage renal disease: Secondary | ICD-10-CM | POA: Diagnosis not present

## 2018-03-16 DIAGNOSIS — I10 Essential (primary) hypertension: Secondary | ICD-10-CM | POA: Diagnosis not present

## 2018-03-16 DIAGNOSIS — N186 End stage renal disease: Secondary | ICD-10-CM | POA: Diagnosis not present

## 2018-03-16 DIAGNOSIS — D509 Iron deficiency anemia, unspecified: Secondary | ICD-10-CM | POA: Diagnosis not present

## 2018-03-16 DIAGNOSIS — N2581 Secondary hyperparathyroidism of renal origin: Secondary | ICD-10-CM | POA: Diagnosis not present

## 2018-03-16 DIAGNOSIS — D631 Anemia in chronic kidney disease: Secondary | ICD-10-CM | POA: Diagnosis not present

## 2018-03-17 DIAGNOSIS — N186 End stage renal disease: Secondary | ICD-10-CM | POA: Diagnosis not present

## 2018-03-17 DIAGNOSIS — N2581 Secondary hyperparathyroidism of renal origin: Secondary | ICD-10-CM | POA: Diagnosis not present

## 2018-03-17 DIAGNOSIS — D631 Anemia in chronic kidney disease: Secondary | ICD-10-CM | POA: Diagnosis not present

## 2018-03-17 DIAGNOSIS — D509 Iron deficiency anemia, unspecified: Secondary | ICD-10-CM | POA: Diagnosis not present

## 2018-03-17 DIAGNOSIS — I10 Essential (primary) hypertension: Secondary | ICD-10-CM | POA: Diagnosis not present

## 2018-03-18 DIAGNOSIS — D631 Anemia in chronic kidney disease: Secondary | ICD-10-CM | POA: Diagnosis not present

## 2018-03-18 DIAGNOSIS — N186 End stage renal disease: Secondary | ICD-10-CM | POA: Diagnosis not present

## 2018-03-18 DIAGNOSIS — D509 Iron deficiency anemia, unspecified: Secondary | ICD-10-CM | POA: Diagnosis not present

## 2018-03-18 DIAGNOSIS — I10 Essential (primary) hypertension: Secondary | ICD-10-CM | POA: Diagnosis not present

## 2018-03-18 DIAGNOSIS — N2581 Secondary hyperparathyroidism of renal origin: Secondary | ICD-10-CM | POA: Diagnosis not present

## 2018-03-19 DIAGNOSIS — D631 Anemia in chronic kidney disease: Secondary | ICD-10-CM | POA: Diagnosis not present

## 2018-03-19 DIAGNOSIS — D509 Iron deficiency anemia, unspecified: Secondary | ICD-10-CM | POA: Diagnosis not present

## 2018-03-19 DIAGNOSIS — N186 End stage renal disease: Secondary | ICD-10-CM | POA: Diagnosis not present

## 2018-03-19 DIAGNOSIS — N2581 Secondary hyperparathyroidism of renal origin: Secondary | ICD-10-CM | POA: Diagnosis not present

## 2018-03-19 DIAGNOSIS — I10 Essential (primary) hypertension: Secondary | ICD-10-CM | POA: Diagnosis not present

## 2018-03-20 DIAGNOSIS — N186 End stage renal disease: Secondary | ICD-10-CM | POA: Diagnosis not present

## 2018-03-20 DIAGNOSIS — D631 Anemia in chronic kidney disease: Secondary | ICD-10-CM | POA: Diagnosis not present

## 2018-03-20 DIAGNOSIS — I10 Essential (primary) hypertension: Secondary | ICD-10-CM | POA: Diagnosis not present

## 2018-03-20 DIAGNOSIS — D509 Iron deficiency anemia, unspecified: Secondary | ICD-10-CM | POA: Diagnosis not present

## 2018-03-20 DIAGNOSIS — N2581 Secondary hyperparathyroidism of renal origin: Secondary | ICD-10-CM | POA: Diagnosis not present

## 2018-03-21 DIAGNOSIS — D631 Anemia in chronic kidney disease: Secondary | ICD-10-CM | POA: Diagnosis not present

## 2018-03-21 DIAGNOSIS — I10 Essential (primary) hypertension: Secondary | ICD-10-CM | POA: Diagnosis not present

## 2018-03-21 DIAGNOSIS — D509 Iron deficiency anemia, unspecified: Secondary | ICD-10-CM | POA: Diagnosis not present

## 2018-03-21 DIAGNOSIS — N2581 Secondary hyperparathyroidism of renal origin: Secondary | ICD-10-CM | POA: Diagnosis not present

## 2018-03-21 DIAGNOSIS — N186 End stage renal disease: Secondary | ICD-10-CM | POA: Diagnosis not present

## 2018-03-22 DIAGNOSIS — N186 End stage renal disease: Secondary | ICD-10-CM | POA: Diagnosis not present

## 2018-03-22 DIAGNOSIS — D631 Anemia in chronic kidney disease: Secondary | ICD-10-CM | POA: Diagnosis not present

## 2018-03-22 DIAGNOSIS — N2581 Secondary hyperparathyroidism of renal origin: Secondary | ICD-10-CM | POA: Diagnosis not present

## 2018-03-22 DIAGNOSIS — D509 Iron deficiency anemia, unspecified: Secondary | ICD-10-CM | POA: Diagnosis not present

## 2018-03-22 DIAGNOSIS — I10 Essential (primary) hypertension: Secondary | ICD-10-CM | POA: Diagnosis not present

## 2018-03-23 DIAGNOSIS — I10 Essential (primary) hypertension: Secondary | ICD-10-CM | POA: Diagnosis not present

## 2018-03-23 DIAGNOSIS — D631 Anemia in chronic kidney disease: Secondary | ICD-10-CM | POA: Diagnosis not present

## 2018-03-23 DIAGNOSIS — D509 Iron deficiency anemia, unspecified: Secondary | ICD-10-CM | POA: Diagnosis not present

## 2018-03-23 DIAGNOSIS — N186 End stage renal disease: Secondary | ICD-10-CM | POA: Diagnosis not present

## 2018-03-23 DIAGNOSIS — N2581 Secondary hyperparathyroidism of renal origin: Secondary | ICD-10-CM | POA: Diagnosis not present

## 2018-03-24 DIAGNOSIS — N186 End stage renal disease: Secondary | ICD-10-CM | POA: Diagnosis not present

## 2018-03-24 DIAGNOSIS — N2581 Secondary hyperparathyroidism of renal origin: Secondary | ICD-10-CM | POA: Diagnosis not present

## 2018-03-24 DIAGNOSIS — D631 Anemia in chronic kidney disease: Secondary | ICD-10-CM | POA: Diagnosis not present

## 2018-03-24 DIAGNOSIS — I10 Essential (primary) hypertension: Secondary | ICD-10-CM | POA: Diagnosis not present

## 2018-03-24 DIAGNOSIS — D509 Iron deficiency anemia, unspecified: Secondary | ICD-10-CM | POA: Diagnosis not present

## 2018-03-25 DIAGNOSIS — D509 Iron deficiency anemia, unspecified: Secondary | ICD-10-CM | POA: Diagnosis not present

## 2018-03-25 DIAGNOSIS — I10 Essential (primary) hypertension: Secondary | ICD-10-CM | POA: Diagnosis not present

## 2018-03-25 DIAGNOSIS — N186 End stage renal disease: Secondary | ICD-10-CM | POA: Diagnosis not present

## 2018-03-25 DIAGNOSIS — N2581 Secondary hyperparathyroidism of renal origin: Secondary | ICD-10-CM | POA: Diagnosis not present

## 2018-03-25 DIAGNOSIS — D631 Anemia in chronic kidney disease: Secondary | ICD-10-CM | POA: Diagnosis not present

## 2018-03-26 DIAGNOSIS — N2581 Secondary hyperparathyroidism of renal origin: Secondary | ICD-10-CM | POA: Diagnosis not present

## 2018-03-26 DIAGNOSIS — D631 Anemia in chronic kidney disease: Secondary | ICD-10-CM | POA: Diagnosis not present

## 2018-03-26 DIAGNOSIS — D509 Iron deficiency anemia, unspecified: Secondary | ICD-10-CM | POA: Diagnosis not present

## 2018-03-26 DIAGNOSIS — I10 Essential (primary) hypertension: Secondary | ICD-10-CM | POA: Diagnosis not present

## 2018-03-26 DIAGNOSIS — N186 End stage renal disease: Secondary | ICD-10-CM | POA: Diagnosis not present

## 2018-03-27 DIAGNOSIS — D631 Anemia in chronic kidney disease: Secondary | ICD-10-CM | POA: Diagnosis not present

## 2018-03-27 DIAGNOSIS — N2581 Secondary hyperparathyroidism of renal origin: Secondary | ICD-10-CM | POA: Diagnosis not present

## 2018-03-27 DIAGNOSIS — I10 Essential (primary) hypertension: Secondary | ICD-10-CM | POA: Diagnosis not present

## 2018-03-27 DIAGNOSIS — D509 Iron deficiency anemia, unspecified: Secondary | ICD-10-CM | POA: Diagnosis not present

## 2018-03-27 DIAGNOSIS — N186 End stage renal disease: Secondary | ICD-10-CM | POA: Diagnosis not present

## 2018-03-28 DIAGNOSIS — N186 End stage renal disease: Secondary | ICD-10-CM | POA: Diagnosis not present

## 2018-03-28 DIAGNOSIS — D509 Iron deficiency anemia, unspecified: Secondary | ICD-10-CM | POA: Diagnosis not present

## 2018-03-28 DIAGNOSIS — N2581 Secondary hyperparathyroidism of renal origin: Secondary | ICD-10-CM | POA: Diagnosis not present

## 2018-03-28 DIAGNOSIS — I10 Essential (primary) hypertension: Secondary | ICD-10-CM | POA: Diagnosis not present

## 2018-03-28 DIAGNOSIS — D631 Anemia in chronic kidney disease: Secondary | ICD-10-CM | POA: Diagnosis not present

## 2018-03-29 DIAGNOSIS — N186 End stage renal disease: Secondary | ICD-10-CM | POA: Diagnosis not present

## 2018-03-29 DIAGNOSIS — I10 Essential (primary) hypertension: Secondary | ICD-10-CM | POA: Diagnosis not present

## 2018-03-29 DIAGNOSIS — N2581 Secondary hyperparathyroidism of renal origin: Secondary | ICD-10-CM | POA: Diagnosis not present

## 2018-03-29 DIAGNOSIS — D631 Anemia in chronic kidney disease: Secondary | ICD-10-CM | POA: Diagnosis not present

## 2018-03-29 DIAGNOSIS — D509 Iron deficiency anemia, unspecified: Secondary | ICD-10-CM | POA: Diagnosis not present

## 2018-03-30 DIAGNOSIS — D631 Anemia in chronic kidney disease: Secondary | ICD-10-CM | POA: Diagnosis not present

## 2018-03-30 DIAGNOSIS — I10 Essential (primary) hypertension: Secondary | ICD-10-CM | POA: Diagnosis not present

## 2018-03-30 DIAGNOSIS — N2581 Secondary hyperparathyroidism of renal origin: Secondary | ICD-10-CM | POA: Diagnosis not present

## 2018-03-30 DIAGNOSIS — D509 Iron deficiency anemia, unspecified: Secondary | ICD-10-CM | POA: Diagnosis not present

## 2018-03-30 DIAGNOSIS — N186 End stage renal disease: Secondary | ICD-10-CM | POA: Diagnosis not present

## 2018-03-31 DIAGNOSIS — N186 End stage renal disease: Secondary | ICD-10-CM | POA: Diagnosis not present

## 2018-03-31 DIAGNOSIS — N2581 Secondary hyperparathyroidism of renal origin: Secondary | ICD-10-CM | POA: Diagnosis not present

## 2018-03-31 DIAGNOSIS — Z992 Dependence on renal dialysis: Secondary | ICD-10-CM | POA: Diagnosis not present

## 2018-03-31 DIAGNOSIS — D509 Iron deficiency anemia, unspecified: Secondary | ICD-10-CM | POA: Diagnosis not present

## 2018-03-31 DIAGNOSIS — D631 Anemia in chronic kidney disease: Secondary | ICD-10-CM | POA: Diagnosis not present

## 2018-03-31 DIAGNOSIS — I10 Essential (primary) hypertension: Secondary | ICD-10-CM | POA: Diagnosis not present

## 2018-04-01 DIAGNOSIS — N186 End stage renal disease: Secondary | ICD-10-CM | POA: Diagnosis not present

## 2018-04-01 DIAGNOSIS — N2581 Secondary hyperparathyroidism of renal origin: Secondary | ICD-10-CM | POA: Diagnosis not present

## 2018-04-01 DIAGNOSIS — D631 Anemia in chronic kidney disease: Secondary | ICD-10-CM | POA: Diagnosis not present

## 2018-04-01 DIAGNOSIS — I10 Essential (primary) hypertension: Secondary | ICD-10-CM | POA: Diagnosis not present

## 2018-04-01 DIAGNOSIS — D509 Iron deficiency anemia, unspecified: Secondary | ICD-10-CM | POA: Diagnosis not present

## 2018-04-01 DIAGNOSIS — I120 Hypertensive chronic kidney disease with stage 5 chronic kidney disease or end stage renal disease: Secondary | ICD-10-CM | POA: Diagnosis not present

## 2018-04-02 DIAGNOSIS — N186 End stage renal disease: Secondary | ICD-10-CM | POA: Diagnosis not present

## 2018-04-02 DIAGNOSIS — D631 Anemia in chronic kidney disease: Secondary | ICD-10-CM | POA: Diagnosis not present

## 2018-04-02 DIAGNOSIS — D509 Iron deficiency anemia, unspecified: Secondary | ICD-10-CM | POA: Diagnosis not present

## 2018-04-02 DIAGNOSIS — I10 Essential (primary) hypertension: Secondary | ICD-10-CM | POA: Diagnosis not present

## 2018-04-02 DIAGNOSIS — I120 Hypertensive chronic kidney disease with stage 5 chronic kidney disease or end stage renal disease: Secondary | ICD-10-CM | POA: Diagnosis not present

## 2018-04-02 DIAGNOSIS — N2581 Secondary hyperparathyroidism of renal origin: Secondary | ICD-10-CM | POA: Diagnosis not present

## 2018-04-04 DIAGNOSIS — N186 End stage renal disease: Secondary | ICD-10-CM | POA: Diagnosis not present

## 2018-04-04 DIAGNOSIS — D631 Anemia in chronic kidney disease: Secondary | ICD-10-CM | POA: Diagnosis not present

## 2018-04-04 DIAGNOSIS — I120 Hypertensive chronic kidney disease with stage 5 chronic kidney disease or end stage renal disease: Secondary | ICD-10-CM | POA: Diagnosis not present

## 2018-04-04 DIAGNOSIS — N2581 Secondary hyperparathyroidism of renal origin: Secondary | ICD-10-CM | POA: Diagnosis not present

## 2018-04-04 DIAGNOSIS — I10 Essential (primary) hypertension: Secondary | ICD-10-CM | POA: Diagnosis not present

## 2018-04-04 DIAGNOSIS — D509 Iron deficiency anemia, unspecified: Secondary | ICD-10-CM | POA: Diagnosis not present

## 2018-04-07 DIAGNOSIS — D631 Anemia in chronic kidney disease: Secondary | ICD-10-CM | POA: Diagnosis not present

## 2018-04-07 DIAGNOSIS — I120 Hypertensive chronic kidney disease with stage 5 chronic kidney disease or end stage renal disease: Secondary | ICD-10-CM | POA: Diagnosis not present

## 2018-04-07 DIAGNOSIS — I10 Essential (primary) hypertension: Secondary | ICD-10-CM | POA: Diagnosis not present

## 2018-04-07 DIAGNOSIS — D509 Iron deficiency anemia, unspecified: Secondary | ICD-10-CM | POA: Diagnosis not present

## 2018-04-07 DIAGNOSIS — N186 End stage renal disease: Secondary | ICD-10-CM | POA: Diagnosis not present

## 2018-04-07 DIAGNOSIS — N2581 Secondary hyperparathyroidism of renal origin: Secondary | ICD-10-CM | POA: Diagnosis not present

## 2018-04-07 DIAGNOSIS — E119 Type 2 diabetes mellitus without complications: Secondary | ICD-10-CM | POA: Diagnosis not present

## 2018-04-11 DIAGNOSIS — D509 Iron deficiency anemia, unspecified: Secondary | ICD-10-CM | POA: Diagnosis not present

## 2018-04-11 DIAGNOSIS — D631 Anemia in chronic kidney disease: Secondary | ICD-10-CM | POA: Diagnosis not present

## 2018-04-11 DIAGNOSIS — I120 Hypertensive chronic kidney disease with stage 5 chronic kidney disease or end stage renal disease: Secondary | ICD-10-CM | POA: Diagnosis not present

## 2018-04-11 DIAGNOSIS — I10 Essential (primary) hypertension: Secondary | ICD-10-CM | POA: Diagnosis not present

## 2018-04-11 DIAGNOSIS — N186 End stage renal disease: Secondary | ICD-10-CM | POA: Diagnosis not present

## 2018-04-11 DIAGNOSIS — N2581 Secondary hyperparathyroidism of renal origin: Secondary | ICD-10-CM | POA: Diagnosis not present

## 2018-04-12 DIAGNOSIS — N186 End stage renal disease: Secondary | ICD-10-CM | POA: Diagnosis not present

## 2018-04-12 DIAGNOSIS — I10 Essential (primary) hypertension: Secondary | ICD-10-CM | POA: Diagnosis not present

## 2018-04-12 DIAGNOSIS — N2581 Secondary hyperparathyroidism of renal origin: Secondary | ICD-10-CM | POA: Diagnosis not present

## 2018-04-12 DIAGNOSIS — D631 Anemia in chronic kidney disease: Secondary | ICD-10-CM | POA: Diagnosis not present

## 2018-04-12 DIAGNOSIS — D509 Iron deficiency anemia, unspecified: Secondary | ICD-10-CM | POA: Diagnosis not present

## 2018-04-12 DIAGNOSIS — I120 Hypertensive chronic kidney disease with stage 5 chronic kidney disease or end stage renal disease: Secondary | ICD-10-CM | POA: Diagnosis not present

## 2018-04-13 DIAGNOSIS — F339 Major depressive disorder, recurrent, unspecified: Secondary | ICD-10-CM | POA: Diagnosis not present

## 2018-04-13 DIAGNOSIS — M545 Low back pain: Secondary | ICD-10-CM | POA: Diagnosis not present

## 2018-04-13 DIAGNOSIS — R350 Frequency of micturition: Secondary | ICD-10-CM | POA: Diagnosis not present

## 2018-04-13 DIAGNOSIS — R634 Abnormal weight loss: Secondary | ICD-10-CM | POA: Diagnosis not present

## 2018-04-13 DIAGNOSIS — R1084 Generalized abdominal pain: Secondary | ICD-10-CM | POA: Diagnosis not present

## 2018-04-17 DIAGNOSIS — D509 Iron deficiency anemia, unspecified: Secondary | ICD-10-CM | POA: Diagnosis not present

## 2018-04-17 DIAGNOSIS — I10 Essential (primary) hypertension: Secondary | ICD-10-CM | POA: Diagnosis not present

## 2018-04-17 DIAGNOSIS — N2581 Secondary hyperparathyroidism of renal origin: Secondary | ICD-10-CM | POA: Diagnosis not present

## 2018-04-17 DIAGNOSIS — D631 Anemia in chronic kidney disease: Secondary | ICD-10-CM | POA: Diagnosis not present

## 2018-04-17 DIAGNOSIS — I120 Hypertensive chronic kidney disease with stage 5 chronic kidney disease or end stage renal disease: Secondary | ICD-10-CM | POA: Diagnosis not present

## 2018-04-17 DIAGNOSIS — N186 End stage renal disease: Secondary | ICD-10-CM | POA: Diagnosis not present

## 2018-04-18 DIAGNOSIS — N2581 Secondary hyperparathyroidism of renal origin: Secondary | ICD-10-CM | POA: Diagnosis not present

## 2018-04-18 DIAGNOSIS — D509 Iron deficiency anemia, unspecified: Secondary | ICD-10-CM | POA: Diagnosis not present

## 2018-04-18 DIAGNOSIS — N186 End stage renal disease: Secondary | ICD-10-CM | POA: Diagnosis not present

## 2018-04-18 DIAGNOSIS — I120 Hypertensive chronic kidney disease with stage 5 chronic kidney disease or end stage renal disease: Secondary | ICD-10-CM | POA: Diagnosis not present

## 2018-04-18 DIAGNOSIS — I10 Essential (primary) hypertension: Secondary | ICD-10-CM | POA: Diagnosis not present

## 2018-04-18 DIAGNOSIS — D631 Anemia in chronic kidney disease: Secondary | ICD-10-CM | POA: Diagnosis not present

## 2018-04-23 DIAGNOSIS — N2581 Secondary hyperparathyroidism of renal origin: Secondary | ICD-10-CM | POA: Diagnosis not present

## 2018-04-23 DIAGNOSIS — N186 End stage renal disease: Secondary | ICD-10-CM | POA: Diagnosis not present

## 2018-04-23 DIAGNOSIS — I120 Hypertensive chronic kidney disease with stage 5 chronic kidney disease or end stage renal disease: Secondary | ICD-10-CM | POA: Diagnosis not present

## 2018-04-23 DIAGNOSIS — I10 Essential (primary) hypertension: Secondary | ICD-10-CM | POA: Diagnosis not present

## 2018-04-23 DIAGNOSIS — D631 Anemia in chronic kidney disease: Secondary | ICD-10-CM | POA: Diagnosis not present

## 2018-04-23 DIAGNOSIS — D509 Iron deficiency anemia, unspecified: Secondary | ICD-10-CM | POA: Diagnosis not present

## 2018-04-24 DIAGNOSIS — D631 Anemia in chronic kidney disease: Secondary | ICD-10-CM | POA: Diagnosis not present

## 2018-04-24 DIAGNOSIS — I10 Essential (primary) hypertension: Secondary | ICD-10-CM | POA: Diagnosis not present

## 2018-04-24 DIAGNOSIS — I120 Hypertensive chronic kidney disease with stage 5 chronic kidney disease or end stage renal disease: Secondary | ICD-10-CM | POA: Diagnosis not present

## 2018-04-24 DIAGNOSIS — N2581 Secondary hyperparathyroidism of renal origin: Secondary | ICD-10-CM | POA: Diagnosis not present

## 2018-04-24 DIAGNOSIS — N186 End stage renal disease: Secondary | ICD-10-CM | POA: Diagnosis not present

## 2018-04-24 DIAGNOSIS — D509 Iron deficiency anemia, unspecified: Secondary | ICD-10-CM | POA: Diagnosis not present

## 2018-04-26 DIAGNOSIS — N186 End stage renal disease: Secondary | ICD-10-CM | POA: Diagnosis not present

## 2018-04-26 DIAGNOSIS — D509 Iron deficiency anemia, unspecified: Secondary | ICD-10-CM | POA: Diagnosis not present

## 2018-04-26 DIAGNOSIS — N2581 Secondary hyperparathyroidism of renal origin: Secondary | ICD-10-CM | POA: Diagnosis not present

## 2018-04-26 DIAGNOSIS — I120 Hypertensive chronic kidney disease with stage 5 chronic kidney disease or end stage renal disease: Secondary | ICD-10-CM | POA: Diagnosis not present

## 2018-04-26 DIAGNOSIS — I10 Essential (primary) hypertension: Secondary | ICD-10-CM | POA: Diagnosis not present

## 2018-04-26 DIAGNOSIS — D631 Anemia in chronic kidney disease: Secondary | ICD-10-CM | POA: Diagnosis not present

## 2018-04-29 DIAGNOSIS — I10 Essential (primary) hypertension: Secondary | ICD-10-CM | POA: Diagnosis not present

## 2018-04-29 DIAGNOSIS — N186 End stage renal disease: Secondary | ICD-10-CM | POA: Diagnosis not present

## 2018-04-29 DIAGNOSIS — I120 Hypertensive chronic kidney disease with stage 5 chronic kidney disease or end stage renal disease: Secondary | ICD-10-CM | POA: Diagnosis not present

## 2018-04-29 DIAGNOSIS — N2581 Secondary hyperparathyroidism of renal origin: Secondary | ICD-10-CM | POA: Diagnosis not present

## 2018-04-29 DIAGNOSIS — D509 Iron deficiency anemia, unspecified: Secondary | ICD-10-CM | POA: Diagnosis not present

## 2018-04-29 DIAGNOSIS — D631 Anemia in chronic kidney disease: Secondary | ICD-10-CM | POA: Diagnosis not present

## 2018-04-30 DIAGNOSIS — R0602 Shortness of breath: Secondary | ICD-10-CM | POA: Diagnosis not present

## 2018-04-30 DIAGNOSIS — D631 Anemia in chronic kidney disease: Secondary | ICD-10-CM | POA: Diagnosis not present

## 2018-04-30 DIAGNOSIS — F17200 Nicotine dependence, unspecified, uncomplicated: Secondary | ICD-10-CM | POA: Diagnosis not present

## 2018-04-30 DIAGNOSIS — Z79899 Other long term (current) drug therapy: Secondary | ICD-10-CM | POA: Diagnosis not present

## 2018-04-30 DIAGNOSIS — E8779 Other fluid overload: Secondary | ICD-10-CM | POA: Diagnosis not present

## 2018-04-30 DIAGNOSIS — N2581 Secondary hyperparathyroidism of renal origin: Secondary | ICD-10-CM | POA: Diagnosis not present

## 2018-04-30 DIAGNOSIS — N186 End stage renal disease: Secondary | ICD-10-CM | POA: Diagnosis not present

## 2018-04-30 DIAGNOSIS — D509 Iron deficiency anemia, unspecified: Secondary | ICD-10-CM | POA: Diagnosis not present

## 2018-04-30 DIAGNOSIS — J811 Chronic pulmonary edema: Secondary | ICD-10-CM | POA: Diagnosis not present

## 2018-04-30 DIAGNOSIS — R790 Abnormal level of blood mineral: Secondary | ICD-10-CM | POA: Diagnosis not present

## 2018-04-30 DIAGNOSIS — I10 Essential (primary) hypertension: Secondary | ICD-10-CM | POA: Diagnosis not present

## 2018-05-02 DIAGNOSIS — N2581 Secondary hyperparathyroidism of renal origin: Secondary | ICD-10-CM | POA: Diagnosis not present

## 2018-05-02 DIAGNOSIS — N186 End stage renal disease: Secondary | ICD-10-CM | POA: Diagnosis not present

## 2018-05-02 DIAGNOSIS — D631 Anemia in chronic kidney disease: Secondary | ICD-10-CM | POA: Diagnosis not present

## 2018-05-02 DIAGNOSIS — D509 Iron deficiency anemia, unspecified: Secondary | ICD-10-CM | POA: Diagnosis not present

## 2018-05-03 DIAGNOSIS — N186 End stage renal disease: Secondary | ICD-10-CM | POA: Diagnosis not present

## 2018-05-03 DIAGNOSIS — N2581 Secondary hyperparathyroidism of renal origin: Secondary | ICD-10-CM | POA: Diagnosis not present

## 2018-05-03 DIAGNOSIS — D509 Iron deficiency anemia, unspecified: Secondary | ICD-10-CM | POA: Diagnosis not present

## 2018-05-03 DIAGNOSIS — D631 Anemia in chronic kidney disease: Secondary | ICD-10-CM | POA: Diagnosis not present

## 2018-05-04 DIAGNOSIS — D631 Anemia in chronic kidney disease: Secondary | ICD-10-CM | POA: Diagnosis not present

## 2018-05-04 DIAGNOSIS — N2581 Secondary hyperparathyroidism of renal origin: Secondary | ICD-10-CM | POA: Diagnosis not present

## 2018-05-04 DIAGNOSIS — D509 Iron deficiency anemia, unspecified: Secondary | ICD-10-CM | POA: Diagnosis not present

## 2018-05-04 DIAGNOSIS — N186 End stage renal disease: Secondary | ICD-10-CM | POA: Diagnosis not present

## 2018-05-05 DIAGNOSIS — N186 End stage renal disease: Secondary | ICD-10-CM | POA: Diagnosis not present

## 2018-05-05 DIAGNOSIS — N2581 Secondary hyperparathyroidism of renal origin: Secondary | ICD-10-CM | POA: Diagnosis not present

## 2018-05-05 DIAGNOSIS — D509 Iron deficiency anemia, unspecified: Secondary | ICD-10-CM | POA: Diagnosis not present

## 2018-05-05 DIAGNOSIS — D631 Anemia in chronic kidney disease: Secondary | ICD-10-CM | POA: Diagnosis not present

## 2018-05-06 DIAGNOSIS — N186 End stage renal disease: Secondary | ICD-10-CM | POA: Diagnosis not present

## 2018-05-06 DIAGNOSIS — D509 Iron deficiency anemia, unspecified: Secondary | ICD-10-CM | POA: Diagnosis not present

## 2018-05-06 DIAGNOSIS — D631 Anemia in chronic kidney disease: Secondary | ICD-10-CM | POA: Diagnosis not present

## 2018-05-06 DIAGNOSIS — N2581 Secondary hyperparathyroidism of renal origin: Secondary | ICD-10-CM | POA: Diagnosis not present

## 2018-05-07 DIAGNOSIS — D509 Iron deficiency anemia, unspecified: Secondary | ICD-10-CM | POA: Diagnosis not present

## 2018-05-07 DIAGNOSIS — N186 End stage renal disease: Secondary | ICD-10-CM | POA: Diagnosis not present

## 2018-05-07 DIAGNOSIS — D631 Anemia in chronic kidney disease: Secondary | ICD-10-CM | POA: Diagnosis not present

## 2018-05-07 DIAGNOSIS — N2581 Secondary hyperparathyroidism of renal origin: Secondary | ICD-10-CM | POA: Diagnosis not present

## 2018-05-08 DIAGNOSIS — D509 Iron deficiency anemia, unspecified: Secondary | ICD-10-CM | POA: Diagnosis not present

## 2018-05-08 DIAGNOSIS — N186 End stage renal disease: Secondary | ICD-10-CM | POA: Diagnosis not present

## 2018-05-08 DIAGNOSIS — N2581 Secondary hyperparathyroidism of renal origin: Secondary | ICD-10-CM | POA: Diagnosis not present

## 2018-05-08 DIAGNOSIS — D631 Anemia in chronic kidney disease: Secondary | ICD-10-CM | POA: Diagnosis not present

## 2018-05-10 DIAGNOSIS — N186 End stage renal disease: Secondary | ICD-10-CM | POA: Diagnosis not present

## 2018-05-10 DIAGNOSIS — D509 Iron deficiency anemia, unspecified: Secondary | ICD-10-CM | POA: Diagnosis not present

## 2018-05-10 DIAGNOSIS — D631 Anemia in chronic kidney disease: Secondary | ICD-10-CM | POA: Diagnosis not present

## 2018-05-10 DIAGNOSIS — N2581 Secondary hyperparathyroidism of renal origin: Secondary | ICD-10-CM | POA: Diagnosis not present

## 2018-05-12 DIAGNOSIS — N186 End stage renal disease: Secondary | ICD-10-CM | POA: Diagnosis not present

## 2018-05-12 DIAGNOSIS — D509 Iron deficiency anemia, unspecified: Secondary | ICD-10-CM | POA: Diagnosis not present

## 2018-05-12 DIAGNOSIS — D631 Anemia in chronic kidney disease: Secondary | ICD-10-CM | POA: Diagnosis not present

## 2018-05-12 DIAGNOSIS — N2581 Secondary hyperparathyroidism of renal origin: Secondary | ICD-10-CM | POA: Diagnosis not present

## 2018-05-14 DIAGNOSIS — N2581 Secondary hyperparathyroidism of renal origin: Secondary | ICD-10-CM | POA: Diagnosis not present

## 2018-05-14 DIAGNOSIS — D631 Anemia in chronic kidney disease: Secondary | ICD-10-CM | POA: Diagnosis not present

## 2018-05-14 DIAGNOSIS — D509 Iron deficiency anemia, unspecified: Secondary | ICD-10-CM | POA: Diagnosis not present

## 2018-05-14 DIAGNOSIS — N186 End stage renal disease: Secondary | ICD-10-CM | POA: Diagnosis not present

## 2018-05-15 DIAGNOSIS — D631 Anemia in chronic kidney disease: Secondary | ICD-10-CM | POA: Diagnosis not present

## 2018-05-15 DIAGNOSIS — D509 Iron deficiency anemia, unspecified: Secondary | ICD-10-CM | POA: Diagnosis not present

## 2018-05-15 DIAGNOSIS — N186 End stage renal disease: Secondary | ICD-10-CM | POA: Diagnosis not present

## 2018-05-15 DIAGNOSIS — N2581 Secondary hyperparathyroidism of renal origin: Secondary | ICD-10-CM | POA: Diagnosis not present

## 2018-05-17 DIAGNOSIS — D509 Iron deficiency anemia, unspecified: Secondary | ICD-10-CM | POA: Diagnosis not present

## 2018-05-17 DIAGNOSIS — N2581 Secondary hyperparathyroidism of renal origin: Secondary | ICD-10-CM | POA: Diagnosis not present

## 2018-05-17 DIAGNOSIS — N186 End stage renal disease: Secondary | ICD-10-CM | POA: Diagnosis not present

## 2018-05-17 DIAGNOSIS — D631 Anemia in chronic kidney disease: Secondary | ICD-10-CM | POA: Diagnosis not present

## 2018-05-18 DIAGNOSIS — D631 Anemia in chronic kidney disease: Secondary | ICD-10-CM | POA: Diagnosis not present

## 2018-05-18 DIAGNOSIS — D509 Iron deficiency anemia, unspecified: Secondary | ICD-10-CM | POA: Diagnosis not present

## 2018-05-18 DIAGNOSIS — N186 End stage renal disease: Secondary | ICD-10-CM | POA: Diagnosis not present

## 2018-05-18 DIAGNOSIS — N2581 Secondary hyperparathyroidism of renal origin: Secondary | ICD-10-CM | POA: Diagnosis not present

## 2018-05-22 DIAGNOSIS — Z114 Encounter for screening for human immunodeficiency virus [HIV]: Secondary | ICD-10-CM | POA: Diagnosis not present

## 2018-05-22 DIAGNOSIS — D631 Anemia in chronic kidney disease: Secondary | ICD-10-CM | POA: Diagnosis not present

## 2018-05-22 DIAGNOSIS — E119 Type 2 diabetes mellitus without complications: Secondary | ICD-10-CM | POA: Diagnosis not present

## 2018-05-22 DIAGNOSIS — N2581 Secondary hyperparathyroidism of renal origin: Secondary | ICD-10-CM | POA: Diagnosis not present

## 2018-05-22 DIAGNOSIS — N186 End stage renal disease: Secondary | ICD-10-CM | POA: Diagnosis not present

## 2018-05-22 DIAGNOSIS — Z1159 Encounter for screening for other viral diseases: Secondary | ICD-10-CM | POA: Diagnosis not present

## 2018-05-22 DIAGNOSIS — D509 Iron deficiency anemia, unspecified: Secondary | ICD-10-CM | POA: Diagnosis not present

## 2018-05-23 ENCOUNTER — Ambulatory Visit (HOSPITAL_COMMUNITY): Payer: Self-pay | Admitting: Psychiatry

## 2018-05-23 DIAGNOSIS — N186 End stage renal disease: Secondary | ICD-10-CM | POA: Diagnosis not present

## 2018-05-23 DIAGNOSIS — D631 Anemia in chronic kidney disease: Secondary | ICD-10-CM | POA: Diagnosis not present

## 2018-05-23 DIAGNOSIS — N2581 Secondary hyperparathyroidism of renal origin: Secondary | ICD-10-CM | POA: Diagnosis not present

## 2018-05-23 DIAGNOSIS — D509 Iron deficiency anemia, unspecified: Secondary | ICD-10-CM | POA: Diagnosis not present

## 2018-05-24 DIAGNOSIS — D509 Iron deficiency anemia, unspecified: Secondary | ICD-10-CM | POA: Diagnosis not present

## 2018-05-24 DIAGNOSIS — N2581 Secondary hyperparathyroidism of renal origin: Secondary | ICD-10-CM | POA: Diagnosis not present

## 2018-05-24 DIAGNOSIS — D631 Anemia in chronic kidney disease: Secondary | ICD-10-CM | POA: Diagnosis not present

## 2018-05-24 DIAGNOSIS — N186 End stage renal disease: Secondary | ICD-10-CM | POA: Diagnosis not present

## 2018-05-26 DIAGNOSIS — D509 Iron deficiency anemia, unspecified: Secondary | ICD-10-CM | POA: Diagnosis not present

## 2018-05-26 DIAGNOSIS — N186 End stage renal disease: Secondary | ICD-10-CM | POA: Diagnosis not present

## 2018-05-26 DIAGNOSIS — N2581 Secondary hyperparathyroidism of renal origin: Secondary | ICD-10-CM | POA: Diagnosis not present

## 2018-05-26 DIAGNOSIS — D631 Anemia in chronic kidney disease: Secondary | ICD-10-CM | POA: Diagnosis not present

## 2018-05-29 DIAGNOSIS — D631 Anemia in chronic kidney disease: Secondary | ICD-10-CM | POA: Diagnosis not present

## 2018-05-29 DIAGNOSIS — N186 End stage renal disease: Secondary | ICD-10-CM | POA: Diagnosis not present

## 2018-05-29 DIAGNOSIS — D509 Iron deficiency anemia, unspecified: Secondary | ICD-10-CM | POA: Diagnosis not present

## 2018-05-29 DIAGNOSIS — J301 Allergic rhinitis due to pollen: Secondary | ICD-10-CM | POA: Diagnosis not present

## 2018-05-29 DIAGNOSIS — J4520 Mild intermittent asthma, uncomplicated: Secondary | ICD-10-CM | POA: Diagnosis not present

## 2018-05-29 DIAGNOSIS — N2581 Secondary hyperparathyroidism of renal origin: Secondary | ICD-10-CM | POA: Diagnosis not present

## 2018-05-30 DIAGNOSIS — E8770 Fluid overload, unspecified: Secondary | ICD-10-CM | POA: Diagnosis not present

## 2018-05-30 DIAGNOSIS — I129 Hypertensive chronic kidney disease with stage 1 through stage 4 chronic kidney disease, or unspecified chronic kidney disease: Secondary | ICD-10-CM | POA: Diagnosis not present

## 2018-05-30 DIAGNOSIS — E1122 Type 2 diabetes mellitus with diabetic chronic kidney disease: Secondary | ICD-10-CM | POA: Diagnosis not present

## 2018-05-30 DIAGNOSIS — N189 Chronic kidney disease, unspecified: Secondary | ICD-10-CM | POA: Diagnosis not present

## 2018-05-30 DIAGNOSIS — N186 End stage renal disease: Secondary | ICD-10-CM | POA: Diagnosis not present

## 2018-05-30 DIAGNOSIS — J810 Acute pulmonary edema: Secondary | ICD-10-CM | POA: Diagnosis not present

## 2018-05-30 DIAGNOSIS — N028 Recurrent and persistent hematuria with other morphologic changes: Secondary | ICD-10-CM | POA: Diagnosis not present

## 2018-05-30 DIAGNOSIS — Z8249 Family history of ischemic heart disease and other diseases of the circulatory system: Secondary | ICD-10-CM | POA: Diagnosis not present

## 2018-05-30 DIAGNOSIS — F17200 Nicotine dependence, unspecified, uncomplicated: Secondary | ICD-10-CM | POA: Diagnosis not present

## 2018-05-30 DIAGNOSIS — Z87891 Personal history of nicotine dependence: Secondary | ICD-10-CM | POA: Diagnosis not present

## 2018-05-30 DIAGNOSIS — Z79899 Other long term (current) drug therapy: Secondary | ICD-10-CM | POA: Diagnosis not present

## 2018-05-30 DIAGNOSIS — R0989 Other specified symptoms and signs involving the circulatory and respiratory systems: Secondary | ICD-10-CM | POA: Diagnosis not present

## 2018-05-30 DIAGNOSIS — Z992 Dependence on renal dialysis: Secondary | ICD-10-CM | POA: Diagnosis not present

## 2018-05-30 DIAGNOSIS — Z5329 Procedure and treatment not carried out because of patient's decision for other reasons: Secondary | ICD-10-CM | POA: Diagnosis not present

## 2018-05-30 DIAGNOSIS — R0602 Shortness of breath: Secondary | ICD-10-CM | POA: Diagnosis not present

## 2018-05-30 DIAGNOSIS — R918 Other nonspecific abnormal finding of lung field: Secondary | ICD-10-CM | POA: Diagnosis not present

## 2018-05-31 DIAGNOSIS — N186 End stage renal disease: Secondary | ICD-10-CM | POA: Diagnosis not present

## 2018-05-31 DIAGNOSIS — N2581 Secondary hyperparathyroidism of renal origin: Secondary | ICD-10-CM | POA: Diagnosis not present

## 2018-05-31 DIAGNOSIS — D509 Iron deficiency anemia, unspecified: Secondary | ICD-10-CM | POA: Diagnosis not present

## 2018-05-31 DIAGNOSIS — D631 Anemia in chronic kidney disease: Secondary | ICD-10-CM | POA: Diagnosis not present

## 2018-06-01 DIAGNOSIS — D509 Iron deficiency anemia, unspecified: Secondary | ICD-10-CM | POA: Diagnosis not present

## 2018-06-01 DIAGNOSIS — N2581 Secondary hyperparathyroidism of renal origin: Secondary | ICD-10-CM | POA: Diagnosis not present

## 2018-06-01 DIAGNOSIS — D631 Anemia in chronic kidney disease: Secondary | ICD-10-CM | POA: Diagnosis not present

## 2018-06-01 DIAGNOSIS — N186 End stage renal disease: Secondary | ICD-10-CM | POA: Diagnosis not present

## 2018-06-01 DIAGNOSIS — Z4932 Encounter for adequacy testing for peritoneal dialysis: Secondary | ICD-10-CM | POA: Diagnosis not present

## 2018-06-01 DIAGNOSIS — E119 Type 2 diabetes mellitus without complications: Secondary | ICD-10-CM | POA: Diagnosis not present

## 2018-06-02 DIAGNOSIS — D631 Anemia in chronic kidney disease: Secondary | ICD-10-CM | POA: Diagnosis not present

## 2018-06-02 DIAGNOSIS — N2581 Secondary hyperparathyroidism of renal origin: Secondary | ICD-10-CM | POA: Diagnosis not present

## 2018-06-02 DIAGNOSIS — D509 Iron deficiency anemia, unspecified: Secondary | ICD-10-CM | POA: Diagnosis not present

## 2018-06-02 DIAGNOSIS — N186 End stage renal disease: Secondary | ICD-10-CM | POA: Diagnosis not present

## 2018-06-03 DIAGNOSIS — D631 Anemia in chronic kidney disease: Secondary | ICD-10-CM | POA: Diagnosis not present

## 2018-06-03 DIAGNOSIS — D509 Iron deficiency anemia, unspecified: Secondary | ICD-10-CM | POA: Diagnosis not present

## 2018-06-03 DIAGNOSIS — N186 End stage renal disease: Secondary | ICD-10-CM | POA: Diagnosis not present

## 2018-06-03 DIAGNOSIS — N2581 Secondary hyperparathyroidism of renal origin: Secondary | ICD-10-CM | POA: Diagnosis not present

## 2018-06-04 DIAGNOSIS — N186 End stage renal disease: Secondary | ICD-10-CM | POA: Diagnosis not present

## 2018-06-04 DIAGNOSIS — N2581 Secondary hyperparathyroidism of renal origin: Secondary | ICD-10-CM | POA: Diagnosis not present

## 2018-06-04 DIAGNOSIS — D631 Anemia in chronic kidney disease: Secondary | ICD-10-CM | POA: Diagnosis not present

## 2018-06-04 DIAGNOSIS — D509 Iron deficiency anemia, unspecified: Secondary | ICD-10-CM | POA: Diagnosis not present

## 2018-06-05 DIAGNOSIS — N2581 Secondary hyperparathyroidism of renal origin: Secondary | ICD-10-CM | POA: Diagnosis not present

## 2018-06-05 DIAGNOSIS — D509 Iron deficiency anemia, unspecified: Secondary | ICD-10-CM | POA: Diagnosis not present

## 2018-06-05 DIAGNOSIS — N186 End stage renal disease: Secondary | ICD-10-CM | POA: Diagnosis not present

## 2018-06-05 DIAGNOSIS — D631 Anemia in chronic kidney disease: Secondary | ICD-10-CM | POA: Diagnosis not present

## 2018-06-06 DIAGNOSIS — D631 Anemia in chronic kidney disease: Secondary | ICD-10-CM | POA: Diagnosis not present

## 2018-06-06 DIAGNOSIS — N186 End stage renal disease: Secondary | ICD-10-CM | POA: Diagnosis not present

## 2018-06-06 DIAGNOSIS — N2581 Secondary hyperparathyroidism of renal origin: Secondary | ICD-10-CM | POA: Diagnosis not present

## 2018-06-06 DIAGNOSIS — D509 Iron deficiency anemia, unspecified: Secondary | ICD-10-CM | POA: Diagnosis not present

## 2018-06-07 DIAGNOSIS — D509 Iron deficiency anemia, unspecified: Secondary | ICD-10-CM | POA: Diagnosis not present

## 2018-06-07 DIAGNOSIS — D631 Anemia in chronic kidney disease: Secondary | ICD-10-CM | POA: Diagnosis not present

## 2018-06-07 DIAGNOSIS — N186 End stage renal disease: Secondary | ICD-10-CM | POA: Diagnosis not present

## 2018-06-07 DIAGNOSIS — N2581 Secondary hyperparathyroidism of renal origin: Secondary | ICD-10-CM | POA: Diagnosis not present

## 2018-06-08 DIAGNOSIS — D631 Anemia in chronic kidney disease: Secondary | ICD-10-CM | POA: Diagnosis not present

## 2018-06-08 DIAGNOSIS — D509 Iron deficiency anemia, unspecified: Secondary | ICD-10-CM | POA: Diagnosis not present

## 2018-06-08 DIAGNOSIS — N186 End stage renal disease: Secondary | ICD-10-CM | POA: Diagnosis not present

## 2018-06-08 DIAGNOSIS — N2581 Secondary hyperparathyroidism of renal origin: Secondary | ICD-10-CM | POA: Diagnosis not present

## 2018-06-09 DIAGNOSIS — N186 End stage renal disease: Secondary | ICD-10-CM | POA: Diagnosis not present

## 2018-06-09 DIAGNOSIS — D509 Iron deficiency anemia, unspecified: Secondary | ICD-10-CM | POA: Diagnosis not present

## 2018-06-09 DIAGNOSIS — N2581 Secondary hyperparathyroidism of renal origin: Secondary | ICD-10-CM | POA: Diagnosis not present

## 2018-06-09 DIAGNOSIS — D631 Anemia in chronic kidney disease: Secondary | ICD-10-CM | POA: Diagnosis not present

## 2018-06-10 DIAGNOSIS — N2581 Secondary hyperparathyroidism of renal origin: Secondary | ICD-10-CM | POA: Diagnosis not present

## 2018-06-10 DIAGNOSIS — N186 End stage renal disease: Secondary | ICD-10-CM | POA: Diagnosis not present

## 2018-06-10 DIAGNOSIS — D509 Iron deficiency anemia, unspecified: Secondary | ICD-10-CM | POA: Diagnosis not present

## 2018-06-10 DIAGNOSIS — D631 Anemia in chronic kidney disease: Secondary | ICD-10-CM | POA: Diagnosis not present

## 2018-06-11 DIAGNOSIS — D509 Iron deficiency anemia, unspecified: Secondary | ICD-10-CM | POA: Diagnosis not present

## 2018-06-11 DIAGNOSIS — N2581 Secondary hyperparathyroidism of renal origin: Secondary | ICD-10-CM | POA: Diagnosis not present

## 2018-06-11 DIAGNOSIS — N186 End stage renal disease: Secondary | ICD-10-CM | POA: Diagnosis not present

## 2018-06-11 DIAGNOSIS — D631 Anemia in chronic kidney disease: Secondary | ICD-10-CM | POA: Diagnosis not present

## 2018-06-12 DIAGNOSIS — D631 Anemia in chronic kidney disease: Secondary | ICD-10-CM | POA: Diagnosis not present

## 2018-06-12 DIAGNOSIS — N2581 Secondary hyperparathyroidism of renal origin: Secondary | ICD-10-CM | POA: Diagnosis not present

## 2018-06-12 DIAGNOSIS — D509 Iron deficiency anemia, unspecified: Secondary | ICD-10-CM | POA: Diagnosis not present

## 2018-06-12 DIAGNOSIS — N186 End stage renal disease: Secondary | ICD-10-CM | POA: Diagnosis not present

## 2018-06-14 DIAGNOSIS — D631 Anemia in chronic kidney disease: Secondary | ICD-10-CM | POA: Diagnosis not present

## 2018-06-14 DIAGNOSIS — N2581 Secondary hyperparathyroidism of renal origin: Secondary | ICD-10-CM | POA: Diagnosis not present

## 2018-06-14 DIAGNOSIS — N186 End stage renal disease: Secondary | ICD-10-CM | POA: Diagnosis not present

## 2018-06-14 DIAGNOSIS — D509 Iron deficiency anemia, unspecified: Secondary | ICD-10-CM | POA: Diagnosis not present

## 2018-06-15 DIAGNOSIS — D509 Iron deficiency anemia, unspecified: Secondary | ICD-10-CM | POA: Diagnosis not present

## 2018-06-15 DIAGNOSIS — D631 Anemia in chronic kidney disease: Secondary | ICD-10-CM | POA: Diagnosis not present

## 2018-06-15 DIAGNOSIS — N2581 Secondary hyperparathyroidism of renal origin: Secondary | ICD-10-CM | POA: Diagnosis not present

## 2018-06-15 DIAGNOSIS — N186 End stage renal disease: Secondary | ICD-10-CM | POA: Diagnosis not present

## 2018-06-16 DIAGNOSIS — N2581 Secondary hyperparathyroidism of renal origin: Secondary | ICD-10-CM | POA: Diagnosis not present

## 2018-06-16 DIAGNOSIS — N186 End stage renal disease: Secondary | ICD-10-CM | POA: Diagnosis not present

## 2018-06-16 DIAGNOSIS — D631 Anemia in chronic kidney disease: Secondary | ICD-10-CM | POA: Diagnosis not present

## 2018-06-16 DIAGNOSIS — D509 Iron deficiency anemia, unspecified: Secondary | ICD-10-CM | POA: Diagnosis not present

## 2018-06-17 DIAGNOSIS — N186 End stage renal disease: Secondary | ICD-10-CM | POA: Diagnosis not present

## 2018-06-17 DIAGNOSIS — D509 Iron deficiency anemia, unspecified: Secondary | ICD-10-CM | POA: Diagnosis not present

## 2018-06-17 DIAGNOSIS — N2581 Secondary hyperparathyroidism of renal origin: Secondary | ICD-10-CM | POA: Diagnosis not present

## 2018-06-17 DIAGNOSIS — D631 Anemia in chronic kidney disease: Secondary | ICD-10-CM | POA: Diagnosis not present

## 2018-06-18 DIAGNOSIS — N186 End stage renal disease: Secondary | ICD-10-CM | POA: Diagnosis not present

## 2018-06-18 DIAGNOSIS — D509 Iron deficiency anemia, unspecified: Secondary | ICD-10-CM | POA: Diagnosis not present

## 2018-06-18 DIAGNOSIS — D631 Anemia in chronic kidney disease: Secondary | ICD-10-CM | POA: Diagnosis not present

## 2018-06-18 DIAGNOSIS — N2581 Secondary hyperparathyroidism of renal origin: Secondary | ICD-10-CM | POA: Diagnosis not present

## 2018-06-19 DIAGNOSIS — N2581 Secondary hyperparathyroidism of renal origin: Secondary | ICD-10-CM | POA: Diagnosis not present

## 2018-06-19 DIAGNOSIS — N186 End stage renal disease: Secondary | ICD-10-CM | POA: Diagnosis not present

## 2018-06-19 DIAGNOSIS — D509 Iron deficiency anemia, unspecified: Secondary | ICD-10-CM | POA: Diagnosis not present

## 2018-06-19 DIAGNOSIS — D631 Anemia in chronic kidney disease: Secondary | ICD-10-CM | POA: Diagnosis not present

## 2018-06-20 DIAGNOSIS — N186 End stage renal disease: Secondary | ICD-10-CM | POA: Diagnosis not present

## 2018-06-20 DIAGNOSIS — N2581 Secondary hyperparathyroidism of renal origin: Secondary | ICD-10-CM | POA: Diagnosis not present

## 2018-06-20 DIAGNOSIS — D509 Iron deficiency anemia, unspecified: Secondary | ICD-10-CM | POA: Diagnosis not present

## 2018-06-20 DIAGNOSIS — D631 Anemia in chronic kidney disease: Secondary | ICD-10-CM | POA: Diagnosis not present

## 2018-06-28 DIAGNOSIS — N2581 Secondary hyperparathyroidism of renal origin: Secondary | ICD-10-CM | POA: Diagnosis not present

## 2018-06-28 DIAGNOSIS — D509 Iron deficiency anemia, unspecified: Secondary | ICD-10-CM | POA: Diagnosis not present

## 2018-06-28 DIAGNOSIS — N186 End stage renal disease: Secondary | ICD-10-CM | POA: Diagnosis not present

## 2018-06-28 DIAGNOSIS — D631 Anemia in chronic kidney disease: Secondary | ICD-10-CM | POA: Diagnosis not present

## 2018-06-29 DIAGNOSIS — N186 End stage renal disease: Secondary | ICD-10-CM | POA: Diagnosis not present

## 2018-06-29 DIAGNOSIS — D631 Anemia in chronic kidney disease: Secondary | ICD-10-CM | POA: Diagnosis not present

## 2018-06-29 DIAGNOSIS — D509 Iron deficiency anemia, unspecified: Secondary | ICD-10-CM | POA: Diagnosis not present

## 2018-06-29 DIAGNOSIS — N2581 Secondary hyperparathyroidism of renal origin: Secondary | ICD-10-CM | POA: Diagnosis not present

## 2018-06-29 DIAGNOSIS — Z992 Dependence on renal dialysis: Secondary | ICD-10-CM | POA: Diagnosis not present

## 2018-06-30 DIAGNOSIS — N186 End stage renal disease: Secondary | ICD-10-CM | POA: Diagnosis not present

## 2018-06-30 DIAGNOSIS — N2581 Secondary hyperparathyroidism of renal origin: Secondary | ICD-10-CM | POA: Diagnosis not present

## 2018-06-30 DIAGNOSIS — D631 Anemia in chronic kidney disease: Secondary | ICD-10-CM | POA: Diagnosis not present

## 2018-07-01 DIAGNOSIS — N2581 Secondary hyperparathyroidism of renal origin: Secondary | ICD-10-CM | POA: Diagnosis not present

## 2018-07-01 DIAGNOSIS — N186 End stage renal disease: Secondary | ICD-10-CM | POA: Diagnosis not present

## 2018-07-01 DIAGNOSIS — D631 Anemia in chronic kidney disease: Secondary | ICD-10-CM | POA: Diagnosis not present

## 2018-07-03 DIAGNOSIS — N186 End stage renal disease: Secondary | ICD-10-CM | POA: Diagnosis not present

## 2018-07-03 DIAGNOSIS — N2581 Secondary hyperparathyroidism of renal origin: Secondary | ICD-10-CM | POA: Diagnosis not present

## 2018-07-03 DIAGNOSIS — D631 Anemia in chronic kidney disease: Secondary | ICD-10-CM | POA: Diagnosis not present

## 2018-07-04 DIAGNOSIS — N186 End stage renal disease: Secondary | ICD-10-CM | POA: Diagnosis not present

## 2018-07-04 DIAGNOSIS — D631 Anemia in chronic kidney disease: Secondary | ICD-10-CM | POA: Diagnosis not present

## 2018-07-04 DIAGNOSIS — N2581 Secondary hyperparathyroidism of renal origin: Secondary | ICD-10-CM | POA: Diagnosis not present

## 2018-07-05 DIAGNOSIS — N186 End stage renal disease: Secondary | ICD-10-CM | POA: Diagnosis not present

## 2018-07-05 DIAGNOSIS — N2581 Secondary hyperparathyroidism of renal origin: Secondary | ICD-10-CM | POA: Diagnosis not present

## 2018-07-05 DIAGNOSIS — D631 Anemia in chronic kidney disease: Secondary | ICD-10-CM | POA: Diagnosis not present

## 2018-07-06 DIAGNOSIS — N186 End stage renal disease: Secondary | ICD-10-CM | POA: Diagnosis not present

## 2018-07-06 DIAGNOSIS — E119 Type 2 diabetes mellitus without complications: Secondary | ICD-10-CM | POA: Diagnosis not present

## 2018-07-06 DIAGNOSIS — N2581 Secondary hyperparathyroidism of renal origin: Secondary | ICD-10-CM | POA: Diagnosis not present

## 2018-07-06 DIAGNOSIS — D631 Anemia in chronic kidney disease: Secondary | ICD-10-CM | POA: Diagnosis not present

## 2018-07-07 DIAGNOSIS — N186 End stage renal disease: Secondary | ICD-10-CM | POA: Diagnosis not present

## 2018-07-07 DIAGNOSIS — D631 Anemia in chronic kidney disease: Secondary | ICD-10-CM | POA: Diagnosis not present

## 2018-07-07 DIAGNOSIS — N2581 Secondary hyperparathyroidism of renal origin: Secondary | ICD-10-CM | POA: Diagnosis not present

## 2018-07-08 DIAGNOSIS — D631 Anemia in chronic kidney disease: Secondary | ICD-10-CM | POA: Diagnosis not present

## 2018-07-08 DIAGNOSIS — N2581 Secondary hyperparathyroidism of renal origin: Secondary | ICD-10-CM | POA: Diagnosis not present

## 2018-07-08 DIAGNOSIS — N186 End stage renal disease: Secondary | ICD-10-CM | POA: Diagnosis not present

## 2018-07-09 DIAGNOSIS — N2581 Secondary hyperparathyroidism of renal origin: Secondary | ICD-10-CM | POA: Diagnosis not present

## 2018-07-09 DIAGNOSIS — D631 Anemia in chronic kidney disease: Secondary | ICD-10-CM | POA: Diagnosis not present

## 2018-07-09 DIAGNOSIS — N186 End stage renal disease: Secondary | ICD-10-CM | POA: Diagnosis not present

## 2018-07-10 DIAGNOSIS — N186 End stage renal disease: Secondary | ICD-10-CM | POA: Diagnosis not present

## 2018-07-10 DIAGNOSIS — N2581 Secondary hyperparathyroidism of renal origin: Secondary | ICD-10-CM | POA: Diagnosis not present

## 2018-07-10 DIAGNOSIS — D631 Anemia in chronic kidney disease: Secondary | ICD-10-CM | POA: Diagnosis not present

## 2018-07-11 DIAGNOSIS — N186 End stage renal disease: Secondary | ICD-10-CM | POA: Diagnosis not present

## 2018-07-11 DIAGNOSIS — D631 Anemia in chronic kidney disease: Secondary | ICD-10-CM | POA: Diagnosis not present

## 2018-07-11 DIAGNOSIS — N2581 Secondary hyperparathyroidism of renal origin: Secondary | ICD-10-CM | POA: Diagnosis not present

## 2018-07-12 DIAGNOSIS — D631 Anemia in chronic kidney disease: Secondary | ICD-10-CM | POA: Diagnosis not present

## 2018-07-12 DIAGNOSIS — N2581 Secondary hyperparathyroidism of renal origin: Secondary | ICD-10-CM | POA: Diagnosis not present

## 2018-07-12 DIAGNOSIS — N186 End stage renal disease: Secondary | ICD-10-CM | POA: Diagnosis not present

## 2018-07-13 DIAGNOSIS — D631 Anemia in chronic kidney disease: Secondary | ICD-10-CM | POA: Diagnosis not present

## 2018-07-13 DIAGNOSIS — N2581 Secondary hyperparathyroidism of renal origin: Secondary | ICD-10-CM | POA: Diagnosis not present

## 2018-07-13 DIAGNOSIS — N186 End stage renal disease: Secondary | ICD-10-CM | POA: Diagnosis not present

## 2018-07-14 DIAGNOSIS — N2581 Secondary hyperparathyroidism of renal origin: Secondary | ICD-10-CM | POA: Diagnosis not present

## 2018-07-14 DIAGNOSIS — D631 Anemia in chronic kidney disease: Secondary | ICD-10-CM | POA: Diagnosis not present

## 2018-07-14 DIAGNOSIS — N186 End stage renal disease: Secondary | ICD-10-CM | POA: Diagnosis not present

## 2018-07-16 DIAGNOSIS — N2581 Secondary hyperparathyroidism of renal origin: Secondary | ICD-10-CM | POA: Diagnosis not present

## 2018-07-16 DIAGNOSIS — N186 End stage renal disease: Secondary | ICD-10-CM | POA: Diagnosis not present

## 2018-07-16 DIAGNOSIS — D631 Anemia in chronic kidney disease: Secondary | ICD-10-CM | POA: Diagnosis not present

## 2018-07-17 DIAGNOSIS — N2581 Secondary hyperparathyroidism of renal origin: Secondary | ICD-10-CM | POA: Diagnosis not present

## 2018-07-17 DIAGNOSIS — D631 Anemia in chronic kidney disease: Secondary | ICD-10-CM | POA: Diagnosis not present

## 2018-07-17 DIAGNOSIS — N186 End stage renal disease: Secondary | ICD-10-CM | POA: Diagnosis not present

## 2018-07-19 DIAGNOSIS — N186 End stage renal disease: Secondary | ICD-10-CM | POA: Diagnosis not present

## 2018-07-19 DIAGNOSIS — N2581 Secondary hyperparathyroidism of renal origin: Secondary | ICD-10-CM | POA: Diagnosis not present

## 2018-07-19 DIAGNOSIS — D631 Anemia in chronic kidney disease: Secondary | ICD-10-CM | POA: Diagnosis not present

## 2018-07-20 DIAGNOSIS — N186 End stage renal disease: Secondary | ICD-10-CM | POA: Diagnosis not present

## 2018-07-20 DIAGNOSIS — N2581 Secondary hyperparathyroidism of renal origin: Secondary | ICD-10-CM | POA: Diagnosis not present

## 2018-07-20 DIAGNOSIS — D631 Anemia in chronic kidney disease: Secondary | ICD-10-CM | POA: Diagnosis not present

## 2018-07-21 DIAGNOSIS — D631 Anemia in chronic kidney disease: Secondary | ICD-10-CM | POA: Diagnosis not present

## 2018-07-21 DIAGNOSIS — N186 End stage renal disease: Secondary | ICD-10-CM | POA: Diagnosis not present

## 2018-07-21 DIAGNOSIS — N2581 Secondary hyperparathyroidism of renal origin: Secondary | ICD-10-CM | POA: Diagnosis not present

## 2018-07-22 DIAGNOSIS — N2581 Secondary hyperparathyroidism of renal origin: Secondary | ICD-10-CM | POA: Diagnosis not present

## 2018-07-22 DIAGNOSIS — D631 Anemia in chronic kidney disease: Secondary | ICD-10-CM | POA: Diagnosis not present

## 2018-07-22 DIAGNOSIS — N186 End stage renal disease: Secondary | ICD-10-CM | POA: Diagnosis not present

## 2018-07-24 DIAGNOSIS — N186 End stage renal disease: Secondary | ICD-10-CM | POA: Diagnosis not present

## 2018-07-24 DIAGNOSIS — D631 Anemia in chronic kidney disease: Secondary | ICD-10-CM | POA: Diagnosis not present

## 2018-07-24 DIAGNOSIS — N2581 Secondary hyperparathyroidism of renal origin: Secondary | ICD-10-CM | POA: Diagnosis not present

## 2018-07-25 DIAGNOSIS — D631 Anemia in chronic kidney disease: Secondary | ICD-10-CM | POA: Diagnosis not present

## 2018-07-25 DIAGNOSIS — N2581 Secondary hyperparathyroidism of renal origin: Secondary | ICD-10-CM | POA: Diagnosis not present

## 2018-07-25 DIAGNOSIS — N186 End stage renal disease: Secondary | ICD-10-CM | POA: Diagnosis not present

## 2018-07-26 DIAGNOSIS — N2581 Secondary hyperparathyroidism of renal origin: Secondary | ICD-10-CM | POA: Diagnosis not present

## 2018-07-26 DIAGNOSIS — N186 End stage renal disease: Secondary | ICD-10-CM | POA: Diagnosis not present

## 2018-07-26 DIAGNOSIS — D631 Anemia in chronic kidney disease: Secondary | ICD-10-CM | POA: Diagnosis not present

## 2018-07-27 DIAGNOSIS — D631 Anemia in chronic kidney disease: Secondary | ICD-10-CM | POA: Diagnosis not present

## 2018-07-27 DIAGNOSIS — N186 End stage renal disease: Secondary | ICD-10-CM | POA: Diagnosis not present

## 2018-07-27 DIAGNOSIS — N2581 Secondary hyperparathyroidism of renal origin: Secondary | ICD-10-CM | POA: Diagnosis not present

## 2018-07-28 DIAGNOSIS — N2581 Secondary hyperparathyroidism of renal origin: Secondary | ICD-10-CM | POA: Diagnosis not present

## 2018-07-28 DIAGNOSIS — D631 Anemia in chronic kidney disease: Secondary | ICD-10-CM | POA: Diagnosis not present

## 2018-07-28 DIAGNOSIS — N186 End stage renal disease: Secondary | ICD-10-CM | POA: Diagnosis not present

## 2018-07-30 DIAGNOSIS — Z992 Dependence on renal dialysis: Secondary | ICD-10-CM | POA: Diagnosis not present

## 2018-07-30 DIAGNOSIS — N186 End stage renal disease: Secondary | ICD-10-CM | POA: Diagnosis not present

## 2018-07-31 DIAGNOSIS — N2581 Secondary hyperparathyroidism of renal origin: Secondary | ICD-10-CM | POA: Diagnosis not present

## 2018-07-31 DIAGNOSIS — D631 Anemia in chronic kidney disease: Secondary | ICD-10-CM | POA: Diagnosis not present

## 2018-07-31 DIAGNOSIS — N186 End stage renal disease: Secondary | ICD-10-CM | POA: Diagnosis not present

## 2018-08-01 DIAGNOSIS — D631 Anemia in chronic kidney disease: Secondary | ICD-10-CM | POA: Diagnosis not present

## 2018-08-01 DIAGNOSIS — N186 End stage renal disease: Secondary | ICD-10-CM | POA: Diagnosis not present

## 2018-08-01 DIAGNOSIS — N2581 Secondary hyperparathyroidism of renal origin: Secondary | ICD-10-CM | POA: Diagnosis not present

## 2018-08-03 DIAGNOSIS — D631 Anemia in chronic kidney disease: Secondary | ICD-10-CM | POA: Diagnosis not present

## 2018-08-03 DIAGNOSIS — N186 End stage renal disease: Secondary | ICD-10-CM | POA: Diagnosis not present

## 2018-08-03 DIAGNOSIS — N2581 Secondary hyperparathyroidism of renal origin: Secondary | ICD-10-CM | POA: Diagnosis not present

## 2018-08-03 DIAGNOSIS — E119 Type 2 diabetes mellitus without complications: Secondary | ICD-10-CM | POA: Diagnosis not present

## 2018-08-05 DIAGNOSIS — N2581 Secondary hyperparathyroidism of renal origin: Secondary | ICD-10-CM | POA: Diagnosis not present

## 2018-08-05 DIAGNOSIS — D631 Anemia in chronic kidney disease: Secondary | ICD-10-CM | POA: Diagnosis not present

## 2018-08-05 DIAGNOSIS — N186 End stage renal disease: Secondary | ICD-10-CM | POA: Diagnosis not present

## 2018-08-08 DIAGNOSIS — D631 Anemia in chronic kidney disease: Secondary | ICD-10-CM | POA: Diagnosis not present

## 2018-08-08 DIAGNOSIS — N186 End stage renal disease: Secondary | ICD-10-CM | POA: Diagnosis not present

## 2018-08-08 DIAGNOSIS — N2581 Secondary hyperparathyroidism of renal origin: Secondary | ICD-10-CM | POA: Diagnosis not present

## 2018-08-09 DIAGNOSIS — N186 End stage renal disease: Secondary | ICD-10-CM | POA: Diagnosis not present

## 2018-08-09 DIAGNOSIS — D631 Anemia in chronic kidney disease: Secondary | ICD-10-CM | POA: Diagnosis not present

## 2018-08-09 DIAGNOSIS — N2581 Secondary hyperparathyroidism of renal origin: Secondary | ICD-10-CM | POA: Diagnosis not present

## 2018-08-13 DIAGNOSIS — D631 Anemia in chronic kidney disease: Secondary | ICD-10-CM | POA: Diagnosis not present

## 2018-08-13 DIAGNOSIS — N186 End stage renal disease: Secondary | ICD-10-CM | POA: Diagnosis not present

## 2018-08-13 DIAGNOSIS — N2581 Secondary hyperparathyroidism of renal origin: Secondary | ICD-10-CM | POA: Diagnosis not present

## 2018-08-14 DIAGNOSIS — N2581 Secondary hyperparathyroidism of renal origin: Secondary | ICD-10-CM | POA: Diagnosis not present

## 2018-08-14 DIAGNOSIS — D631 Anemia in chronic kidney disease: Secondary | ICD-10-CM | POA: Diagnosis not present

## 2018-08-14 DIAGNOSIS — N186 End stage renal disease: Secondary | ICD-10-CM | POA: Diagnosis not present

## 2018-08-15 DIAGNOSIS — N2581 Secondary hyperparathyroidism of renal origin: Secondary | ICD-10-CM | POA: Diagnosis not present

## 2018-08-15 DIAGNOSIS — N186 End stage renal disease: Secondary | ICD-10-CM | POA: Diagnosis not present

## 2018-08-15 DIAGNOSIS — D631 Anemia in chronic kidney disease: Secondary | ICD-10-CM | POA: Diagnosis not present

## 2018-08-17 DIAGNOSIS — N2581 Secondary hyperparathyroidism of renal origin: Secondary | ICD-10-CM | POA: Diagnosis not present

## 2018-08-17 DIAGNOSIS — N186 End stage renal disease: Secondary | ICD-10-CM | POA: Diagnosis not present

## 2018-08-17 DIAGNOSIS — D631 Anemia in chronic kidney disease: Secondary | ICD-10-CM | POA: Diagnosis not present

## 2018-08-21 DIAGNOSIS — N2581 Secondary hyperparathyroidism of renal origin: Secondary | ICD-10-CM | POA: Diagnosis not present

## 2018-08-21 DIAGNOSIS — N186 End stage renal disease: Secondary | ICD-10-CM | POA: Diagnosis not present

## 2018-08-21 DIAGNOSIS — D631 Anemia in chronic kidney disease: Secondary | ICD-10-CM | POA: Diagnosis not present

## 2018-08-22 DIAGNOSIS — D631 Anemia in chronic kidney disease: Secondary | ICD-10-CM | POA: Diagnosis not present

## 2018-08-22 DIAGNOSIS — N186 End stage renal disease: Secondary | ICD-10-CM | POA: Diagnosis not present

## 2018-08-22 DIAGNOSIS — N2581 Secondary hyperparathyroidism of renal origin: Secondary | ICD-10-CM | POA: Diagnosis not present

## 2018-08-24 DIAGNOSIS — N186 End stage renal disease: Secondary | ICD-10-CM | POA: Diagnosis not present

## 2018-08-24 DIAGNOSIS — D631 Anemia in chronic kidney disease: Secondary | ICD-10-CM | POA: Diagnosis not present

## 2018-08-24 DIAGNOSIS — N2581 Secondary hyperparathyroidism of renal origin: Secondary | ICD-10-CM | POA: Diagnosis not present

## 2018-08-26 DIAGNOSIS — N2581 Secondary hyperparathyroidism of renal origin: Secondary | ICD-10-CM | POA: Diagnosis not present

## 2018-08-26 DIAGNOSIS — D631 Anemia in chronic kidney disease: Secondary | ICD-10-CM | POA: Diagnosis not present

## 2018-08-26 DIAGNOSIS — N186 End stage renal disease: Secondary | ICD-10-CM | POA: Diagnosis not present

## 2018-08-27 DIAGNOSIS — N186 End stage renal disease: Secondary | ICD-10-CM | POA: Diagnosis not present

## 2018-08-27 DIAGNOSIS — N2581 Secondary hyperparathyroidism of renal origin: Secondary | ICD-10-CM | POA: Diagnosis not present

## 2018-08-27 DIAGNOSIS — D631 Anemia in chronic kidney disease: Secondary | ICD-10-CM | POA: Diagnosis not present

## 2018-08-28 DIAGNOSIS — D631 Anemia in chronic kidney disease: Secondary | ICD-10-CM | POA: Diagnosis not present

## 2018-08-28 DIAGNOSIS — N186 End stage renal disease: Secondary | ICD-10-CM | POA: Diagnosis not present

## 2018-08-28 DIAGNOSIS — N2581 Secondary hyperparathyroidism of renal origin: Secondary | ICD-10-CM | POA: Diagnosis not present

## 2018-08-29 DIAGNOSIS — Z992 Dependence on renal dialysis: Secondary | ICD-10-CM | POA: Diagnosis not present

## 2018-08-29 DIAGNOSIS — N186 End stage renal disease: Secondary | ICD-10-CM | POA: Diagnosis not present

## 2018-08-29 DIAGNOSIS — N2581 Secondary hyperparathyroidism of renal origin: Secondary | ICD-10-CM | POA: Diagnosis not present

## 2018-08-29 DIAGNOSIS — D631 Anemia in chronic kidney disease: Secondary | ICD-10-CM | POA: Diagnosis not present

## 2018-08-30 DIAGNOSIS — N2581 Secondary hyperparathyroidism of renal origin: Secondary | ICD-10-CM | POA: Diagnosis not present

## 2018-08-30 DIAGNOSIS — N186 End stage renal disease: Secondary | ICD-10-CM | POA: Diagnosis not present

## 2018-08-30 DIAGNOSIS — D631 Anemia in chronic kidney disease: Secondary | ICD-10-CM | POA: Diagnosis not present

## 2018-08-31 DIAGNOSIS — D631 Anemia in chronic kidney disease: Secondary | ICD-10-CM | POA: Diagnosis not present

## 2018-08-31 DIAGNOSIS — N2581 Secondary hyperparathyroidism of renal origin: Secondary | ICD-10-CM | POA: Diagnosis not present

## 2018-08-31 DIAGNOSIS — Z4932 Encounter for adequacy testing for peritoneal dialysis: Secondary | ICD-10-CM | POA: Diagnosis not present

## 2018-08-31 DIAGNOSIS — E119 Type 2 diabetes mellitus without complications: Secondary | ICD-10-CM | POA: Diagnosis not present

## 2018-08-31 DIAGNOSIS — N186 End stage renal disease: Secondary | ICD-10-CM | POA: Diagnosis not present

## 2018-09-01 DIAGNOSIS — D631 Anemia in chronic kidney disease: Secondary | ICD-10-CM | POA: Diagnosis not present

## 2018-09-01 DIAGNOSIS — N186 End stage renal disease: Secondary | ICD-10-CM | POA: Diagnosis not present

## 2018-09-01 DIAGNOSIS — N2581 Secondary hyperparathyroidism of renal origin: Secondary | ICD-10-CM | POA: Diagnosis not present

## 2018-09-02 DIAGNOSIS — N186 End stage renal disease: Secondary | ICD-10-CM | POA: Diagnosis not present

## 2018-09-02 DIAGNOSIS — N2581 Secondary hyperparathyroidism of renal origin: Secondary | ICD-10-CM | POA: Diagnosis not present

## 2018-09-02 DIAGNOSIS — D631 Anemia in chronic kidney disease: Secondary | ICD-10-CM | POA: Diagnosis not present

## 2018-09-03 DIAGNOSIS — N186 End stage renal disease: Secondary | ICD-10-CM | POA: Diagnosis not present

## 2018-09-03 DIAGNOSIS — N2581 Secondary hyperparathyroidism of renal origin: Secondary | ICD-10-CM | POA: Diagnosis not present

## 2018-09-03 DIAGNOSIS — D631 Anemia in chronic kidney disease: Secondary | ICD-10-CM | POA: Diagnosis not present

## 2018-09-04 DIAGNOSIS — D631 Anemia in chronic kidney disease: Secondary | ICD-10-CM | POA: Diagnosis not present

## 2018-09-04 DIAGNOSIS — N186 End stage renal disease: Secondary | ICD-10-CM | POA: Diagnosis not present

## 2018-09-04 DIAGNOSIS — N2581 Secondary hyperparathyroidism of renal origin: Secondary | ICD-10-CM | POA: Diagnosis not present

## 2018-09-05 DIAGNOSIS — N2581 Secondary hyperparathyroidism of renal origin: Secondary | ICD-10-CM | POA: Diagnosis not present

## 2018-09-05 DIAGNOSIS — N186 End stage renal disease: Secondary | ICD-10-CM | POA: Diagnosis not present

## 2018-09-05 DIAGNOSIS — D631 Anemia in chronic kidney disease: Secondary | ICD-10-CM | POA: Diagnosis not present

## 2018-09-06 DIAGNOSIS — N2581 Secondary hyperparathyroidism of renal origin: Secondary | ICD-10-CM | POA: Diagnosis not present

## 2018-09-06 DIAGNOSIS — N186 End stage renal disease: Secondary | ICD-10-CM | POA: Diagnosis not present

## 2018-09-06 DIAGNOSIS — D631 Anemia in chronic kidney disease: Secondary | ICD-10-CM | POA: Diagnosis not present

## 2018-09-07 DIAGNOSIS — N186 End stage renal disease: Secondary | ICD-10-CM | POA: Diagnosis not present

## 2018-09-07 DIAGNOSIS — D631 Anemia in chronic kidney disease: Secondary | ICD-10-CM | POA: Diagnosis not present

## 2018-09-07 DIAGNOSIS — N2581 Secondary hyperparathyroidism of renal origin: Secondary | ICD-10-CM | POA: Diagnosis not present

## 2018-09-08 DIAGNOSIS — N2581 Secondary hyperparathyroidism of renal origin: Secondary | ICD-10-CM | POA: Diagnosis not present

## 2018-09-08 DIAGNOSIS — D631 Anemia in chronic kidney disease: Secondary | ICD-10-CM | POA: Diagnosis not present

## 2018-09-08 DIAGNOSIS — N186 End stage renal disease: Secondary | ICD-10-CM | POA: Diagnosis not present

## 2018-09-09 DIAGNOSIS — N2581 Secondary hyperparathyroidism of renal origin: Secondary | ICD-10-CM | POA: Diagnosis not present

## 2018-09-09 DIAGNOSIS — D631 Anemia in chronic kidney disease: Secondary | ICD-10-CM | POA: Diagnosis not present

## 2018-09-09 DIAGNOSIS — N186 End stage renal disease: Secondary | ICD-10-CM | POA: Diagnosis not present

## 2018-09-10 DIAGNOSIS — D631 Anemia in chronic kidney disease: Secondary | ICD-10-CM | POA: Diagnosis not present

## 2018-09-10 DIAGNOSIS — N2581 Secondary hyperparathyroidism of renal origin: Secondary | ICD-10-CM | POA: Diagnosis not present

## 2018-09-10 DIAGNOSIS — N186 End stage renal disease: Secondary | ICD-10-CM | POA: Diagnosis not present

## 2018-09-11 DIAGNOSIS — N2581 Secondary hyperparathyroidism of renal origin: Secondary | ICD-10-CM | POA: Diagnosis not present

## 2018-09-11 DIAGNOSIS — N186 End stage renal disease: Secondary | ICD-10-CM | POA: Diagnosis not present

## 2018-09-11 DIAGNOSIS — D631 Anemia in chronic kidney disease: Secondary | ICD-10-CM | POA: Diagnosis not present

## 2018-09-12 DIAGNOSIS — D631 Anemia in chronic kidney disease: Secondary | ICD-10-CM | POA: Diagnosis not present

## 2018-09-12 DIAGNOSIS — N2581 Secondary hyperparathyroidism of renal origin: Secondary | ICD-10-CM | POA: Diagnosis not present

## 2018-09-12 DIAGNOSIS — N186 End stage renal disease: Secondary | ICD-10-CM | POA: Diagnosis not present

## 2018-09-13 DIAGNOSIS — D631 Anemia in chronic kidney disease: Secondary | ICD-10-CM | POA: Diagnosis not present

## 2018-09-13 DIAGNOSIS — N186 End stage renal disease: Secondary | ICD-10-CM | POA: Diagnosis not present

## 2018-09-13 DIAGNOSIS — N2581 Secondary hyperparathyroidism of renal origin: Secondary | ICD-10-CM | POA: Diagnosis not present

## 2018-09-14 DIAGNOSIS — N2581 Secondary hyperparathyroidism of renal origin: Secondary | ICD-10-CM | POA: Diagnosis not present

## 2018-09-14 DIAGNOSIS — N186 End stage renal disease: Secondary | ICD-10-CM | POA: Diagnosis not present

## 2018-09-14 DIAGNOSIS — D631 Anemia in chronic kidney disease: Secondary | ICD-10-CM | POA: Diagnosis not present

## 2018-09-15 DIAGNOSIS — N186 End stage renal disease: Secondary | ICD-10-CM | POA: Diagnosis not present

## 2018-09-15 DIAGNOSIS — N2581 Secondary hyperparathyroidism of renal origin: Secondary | ICD-10-CM | POA: Diagnosis not present

## 2018-09-15 DIAGNOSIS — D631 Anemia in chronic kidney disease: Secondary | ICD-10-CM | POA: Diagnosis not present

## 2018-09-16 DIAGNOSIS — N2581 Secondary hyperparathyroidism of renal origin: Secondary | ICD-10-CM | POA: Diagnosis not present

## 2018-09-16 DIAGNOSIS — D631 Anemia in chronic kidney disease: Secondary | ICD-10-CM | POA: Diagnosis not present

## 2018-09-16 DIAGNOSIS — N186 End stage renal disease: Secondary | ICD-10-CM | POA: Diagnosis not present

## 2018-09-17 DIAGNOSIS — N186 End stage renal disease: Secondary | ICD-10-CM | POA: Diagnosis not present

## 2018-09-17 DIAGNOSIS — D631 Anemia in chronic kidney disease: Secondary | ICD-10-CM | POA: Diagnosis not present

## 2018-09-17 DIAGNOSIS — N2581 Secondary hyperparathyroidism of renal origin: Secondary | ICD-10-CM | POA: Diagnosis not present

## 2018-09-18 DIAGNOSIS — D631 Anemia in chronic kidney disease: Secondary | ICD-10-CM | POA: Diagnosis not present

## 2018-09-18 DIAGNOSIS — N186 End stage renal disease: Secondary | ICD-10-CM | POA: Diagnosis not present

## 2018-09-18 DIAGNOSIS — N2581 Secondary hyperparathyroidism of renal origin: Secondary | ICD-10-CM | POA: Diagnosis not present

## 2018-09-19 DIAGNOSIS — D631 Anemia in chronic kidney disease: Secondary | ICD-10-CM | POA: Diagnosis not present

## 2018-09-19 DIAGNOSIS — N2581 Secondary hyperparathyroidism of renal origin: Secondary | ICD-10-CM | POA: Diagnosis not present

## 2018-09-19 DIAGNOSIS — N186 End stage renal disease: Secondary | ICD-10-CM | POA: Diagnosis not present

## 2018-09-20 DIAGNOSIS — N2581 Secondary hyperparathyroidism of renal origin: Secondary | ICD-10-CM | POA: Diagnosis not present

## 2018-09-20 DIAGNOSIS — D631 Anemia in chronic kidney disease: Secondary | ICD-10-CM | POA: Diagnosis not present

## 2018-09-20 DIAGNOSIS — N186 End stage renal disease: Secondary | ICD-10-CM | POA: Diagnosis not present

## 2018-09-21 DIAGNOSIS — N2581 Secondary hyperparathyroidism of renal origin: Secondary | ICD-10-CM | POA: Diagnosis not present

## 2018-09-21 DIAGNOSIS — N186 End stage renal disease: Secondary | ICD-10-CM | POA: Diagnosis not present

## 2018-09-21 DIAGNOSIS — D631 Anemia in chronic kidney disease: Secondary | ICD-10-CM | POA: Diagnosis not present

## 2018-09-22 DIAGNOSIS — N2581 Secondary hyperparathyroidism of renal origin: Secondary | ICD-10-CM | POA: Diagnosis not present

## 2018-09-22 DIAGNOSIS — N186 End stage renal disease: Secondary | ICD-10-CM | POA: Diagnosis not present

## 2018-09-22 DIAGNOSIS — D631 Anemia in chronic kidney disease: Secondary | ICD-10-CM | POA: Diagnosis not present

## 2018-09-23 DIAGNOSIS — D631 Anemia in chronic kidney disease: Secondary | ICD-10-CM | POA: Diagnosis not present

## 2018-09-23 DIAGNOSIS — N2581 Secondary hyperparathyroidism of renal origin: Secondary | ICD-10-CM | POA: Diagnosis not present

## 2018-09-23 DIAGNOSIS — N186 End stage renal disease: Secondary | ICD-10-CM | POA: Diagnosis not present

## 2018-09-24 DIAGNOSIS — N2581 Secondary hyperparathyroidism of renal origin: Secondary | ICD-10-CM | POA: Diagnosis not present

## 2018-09-24 DIAGNOSIS — N186 End stage renal disease: Secondary | ICD-10-CM | POA: Diagnosis not present

## 2018-09-24 DIAGNOSIS — D631 Anemia in chronic kidney disease: Secondary | ICD-10-CM | POA: Diagnosis not present

## 2018-09-25 DIAGNOSIS — N2581 Secondary hyperparathyroidism of renal origin: Secondary | ICD-10-CM | POA: Diagnosis not present

## 2018-09-25 DIAGNOSIS — D631 Anemia in chronic kidney disease: Secondary | ICD-10-CM | POA: Diagnosis not present

## 2018-09-25 DIAGNOSIS — N186 End stage renal disease: Secondary | ICD-10-CM | POA: Diagnosis not present

## 2018-09-26 DIAGNOSIS — N186 End stage renal disease: Secondary | ICD-10-CM | POA: Diagnosis not present

## 2018-09-26 DIAGNOSIS — N2581 Secondary hyperparathyroidism of renal origin: Secondary | ICD-10-CM | POA: Diagnosis not present

## 2018-09-26 DIAGNOSIS — D631 Anemia in chronic kidney disease: Secondary | ICD-10-CM | POA: Diagnosis not present

## 2018-09-27 DIAGNOSIS — N186 End stage renal disease: Secondary | ICD-10-CM | POA: Diagnosis not present

## 2018-09-27 DIAGNOSIS — D631 Anemia in chronic kidney disease: Secondary | ICD-10-CM | POA: Diagnosis not present

## 2018-09-27 DIAGNOSIS — N2581 Secondary hyperparathyroidism of renal origin: Secondary | ICD-10-CM | POA: Diagnosis not present

## 2018-09-28 DIAGNOSIS — D631 Anemia in chronic kidney disease: Secondary | ICD-10-CM | POA: Diagnosis not present

## 2018-09-28 DIAGNOSIS — N186 End stage renal disease: Secondary | ICD-10-CM | POA: Diagnosis not present

## 2018-09-28 DIAGNOSIS — N2581 Secondary hyperparathyroidism of renal origin: Secondary | ICD-10-CM | POA: Diagnosis not present

## 2018-09-29 DIAGNOSIS — N2581 Secondary hyperparathyroidism of renal origin: Secondary | ICD-10-CM | POA: Diagnosis not present

## 2018-09-29 DIAGNOSIS — D631 Anemia in chronic kidney disease: Secondary | ICD-10-CM | POA: Diagnosis not present

## 2018-09-29 DIAGNOSIS — Z992 Dependence on renal dialysis: Secondary | ICD-10-CM | POA: Diagnosis not present

## 2018-09-29 DIAGNOSIS — N186 End stage renal disease: Secondary | ICD-10-CM | POA: Diagnosis not present

## 2018-09-30 DIAGNOSIS — D631 Anemia in chronic kidney disease: Secondary | ICD-10-CM | POA: Diagnosis not present

## 2018-09-30 DIAGNOSIS — N186 End stage renal disease: Secondary | ICD-10-CM | POA: Diagnosis not present

## 2018-09-30 DIAGNOSIS — N2581 Secondary hyperparathyroidism of renal origin: Secondary | ICD-10-CM | POA: Diagnosis not present

## 2018-10-01 DIAGNOSIS — N2581 Secondary hyperparathyroidism of renal origin: Secondary | ICD-10-CM | POA: Diagnosis not present

## 2018-10-01 DIAGNOSIS — N186 End stage renal disease: Secondary | ICD-10-CM | POA: Diagnosis not present

## 2018-10-01 DIAGNOSIS — D631 Anemia in chronic kidney disease: Secondary | ICD-10-CM | POA: Diagnosis not present

## 2018-10-02 DIAGNOSIS — D631 Anemia in chronic kidney disease: Secondary | ICD-10-CM | POA: Diagnosis not present

## 2018-10-02 DIAGNOSIS — N2581 Secondary hyperparathyroidism of renal origin: Secondary | ICD-10-CM | POA: Diagnosis not present

## 2018-10-02 DIAGNOSIS — N186 End stage renal disease: Secondary | ICD-10-CM | POA: Diagnosis not present

## 2018-10-03 DIAGNOSIS — N186 End stage renal disease: Secondary | ICD-10-CM | POA: Diagnosis not present

## 2018-10-03 DIAGNOSIS — N2581 Secondary hyperparathyroidism of renal origin: Secondary | ICD-10-CM | POA: Diagnosis not present

## 2018-10-03 DIAGNOSIS — D631 Anemia in chronic kidney disease: Secondary | ICD-10-CM | POA: Diagnosis not present

## 2018-10-04 DIAGNOSIS — N2581 Secondary hyperparathyroidism of renal origin: Secondary | ICD-10-CM | POA: Diagnosis not present

## 2018-10-04 DIAGNOSIS — N186 End stage renal disease: Secondary | ICD-10-CM | POA: Diagnosis not present

## 2018-10-04 DIAGNOSIS — D631 Anemia in chronic kidney disease: Secondary | ICD-10-CM | POA: Diagnosis not present

## 2018-10-05 DIAGNOSIS — D631 Anemia in chronic kidney disease: Secondary | ICD-10-CM | POA: Diagnosis not present

## 2018-10-05 DIAGNOSIS — N186 End stage renal disease: Secondary | ICD-10-CM | POA: Diagnosis not present

## 2018-10-05 DIAGNOSIS — N2581 Secondary hyperparathyroidism of renal origin: Secondary | ICD-10-CM | POA: Diagnosis not present

## 2018-10-05 DIAGNOSIS — E119 Type 2 diabetes mellitus without complications: Secondary | ICD-10-CM | POA: Diagnosis not present

## 2018-10-09 DIAGNOSIS — N186 End stage renal disease: Secondary | ICD-10-CM | POA: Diagnosis not present

## 2018-10-09 DIAGNOSIS — D631 Anemia in chronic kidney disease: Secondary | ICD-10-CM | POA: Diagnosis not present

## 2018-10-09 DIAGNOSIS — N2581 Secondary hyperparathyroidism of renal origin: Secondary | ICD-10-CM | POA: Diagnosis not present

## 2018-10-11 DIAGNOSIS — D631 Anemia in chronic kidney disease: Secondary | ICD-10-CM | POA: Diagnosis not present

## 2018-10-11 DIAGNOSIS — N186 End stage renal disease: Secondary | ICD-10-CM | POA: Diagnosis not present

## 2018-10-11 DIAGNOSIS — N2581 Secondary hyperparathyroidism of renal origin: Secondary | ICD-10-CM | POA: Diagnosis not present

## 2018-10-19 DIAGNOSIS — N2581 Secondary hyperparathyroidism of renal origin: Secondary | ICD-10-CM | POA: Diagnosis not present

## 2018-10-19 DIAGNOSIS — D631 Anemia in chronic kidney disease: Secondary | ICD-10-CM | POA: Diagnosis not present

## 2018-10-19 DIAGNOSIS — N186 End stage renal disease: Secondary | ICD-10-CM | POA: Diagnosis not present

## 2018-10-20 DIAGNOSIS — N186 End stage renal disease: Secondary | ICD-10-CM | POA: Diagnosis not present

## 2018-10-20 DIAGNOSIS — N2581 Secondary hyperparathyroidism of renal origin: Secondary | ICD-10-CM | POA: Diagnosis not present

## 2018-10-20 DIAGNOSIS — D631 Anemia in chronic kidney disease: Secondary | ICD-10-CM | POA: Diagnosis not present

## 2018-10-28 DIAGNOSIS — D631 Anemia in chronic kidney disease: Secondary | ICD-10-CM | POA: Diagnosis not present

## 2018-10-28 DIAGNOSIS — N2581 Secondary hyperparathyroidism of renal origin: Secondary | ICD-10-CM | POA: Diagnosis not present

## 2018-10-28 DIAGNOSIS — N186 End stage renal disease: Secondary | ICD-10-CM | POA: Diagnosis not present

## 2018-10-30 DIAGNOSIS — D631 Anemia in chronic kidney disease: Secondary | ICD-10-CM | POA: Diagnosis not present

## 2018-10-30 DIAGNOSIS — N2581 Secondary hyperparathyroidism of renal origin: Secondary | ICD-10-CM | POA: Diagnosis not present

## 2018-10-30 DIAGNOSIS — N186 End stage renal disease: Secondary | ICD-10-CM | POA: Diagnosis not present

## 2018-10-31 DIAGNOSIS — D631 Anemia in chronic kidney disease: Secondary | ICD-10-CM | POA: Diagnosis not present

## 2018-10-31 DIAGNOSIS — N186 End stage renal disease: Secondary | ICD-10-CM | POA: Diagnosis not present

## 2018-10-31 DIAGNOSIS — N2581 Secondary hyperparathyroidism of renal origin: Secondary | ICD-10-CM | POA: Diagnosis not present

## 2018-11-03 DIAGNOSIS — D631 Anemia in chronic kidney disease: Secondary | ICD-10-CM | POA: Diagnosis not present

## 2018-11-03 DIAGNOSIS — N186 End stage renal disease: Secondary | ICD-10-CM | POA: Diagnosis not present

## 2018-11-03 DIAGNOSIS — E119 Type 2 diabetes mellitus without complications: Secondary | ICD-10-CM | POA: Diagnosis not present

## 2018-11-03 DIAGNOSIS — N2581 Secondary hyperparathyroidism of renal origin: Secondary | ICD-10-CM | POA: Diagnosis not present

## 2018-11-04 DIAGNOSIS — N2581 Secondary hyperparathyroidism of renal origin: Secondary | ICD-10-CM | POA: Diagnosis not present

## 2018-11-04 DIAGNOSIS — N186 End stage renal disease: Secondary | ICD-10-CM | POA: Diagnosis not present

## 2018-11-04 DIAGNOSIS — D631 Anemia in chronic kidney disease: Secondary | ICD-10-CM | POA: Diagnosis not present

## 2018-11-05 DIAGNOSIS — N2581 Secondary hyperparathyroidism of renal origin: Secondary | ICD-10-CM | POA: Diagnosis not present

## 2018-11-05 DIAGNOSIS — D631 Anemia in chronic kidney disease: Secondary | ICD-10-CM | POA: Diagnosis not present

## 2018-11-05 DIAGNOSIS — N186 End stage renal disease: Secondary | ICD-10-CM | POA: Diagnosis not present

## 2018-11-06 DIAGNOSIS — N186 End stage renal disease: Secondary | ICD-10-CM | POA: Diagnosis not present

## 2018-11-06 DIAGNOSIS — N2581 Secondary hyperparathyroidism of renal origin: Secondary | ICD-10-CM | POA: Diagnosis not present

## 2018-11-06 DIAGNOSIS — D631 Anemia in chronic kidney disease: Secondary | ICD-10-CM | POA: Diagnosis not present

## 2018-11-07 DIAGNOSIS — D631 Anemia in chronic kidney disease: Secondary | ICD-10-CM | POA: Diagnosis not present

## 2018-11-07 DIAGNOSIS — N186 End stage renal disease: Secondary | ICD-10-CM | POA: Diagnosis not present

## 2018-11-07 DIAGNOSIS — Z4932 Encounter for adequacy testing for peritoneal dialysis: Secondary | ICD-10-CM | POA: Diagnosis not present

## 2018-11-07 DIAGNOSIS — N2581 Secondary hyperparathyroidism of renal origin: Secondary | ICD-10-CM | POA: Diagnosis not present

## 2018-11-08 DIAGNOSIS — D631 Anemia in chronic kidney disease: Secondary | ICD-10-CM | POA: Diagnosis not present

## 2018-11-08 DIAGNOSIS — N186 End stage renal disease: Secondary | ICD-10-CM | POA: Diagnosis not present

## 2018-11-08 DIAGNOSIS — N2581 Secondary hyperparathyroidism of renal origin: Secondary | ICD-10-CM | POA: Diagnosis not present

## 2018-11-09 DIAGNOSIS — N186 End stage renal disease: Secondary | ICD-10-CM | POA: Diagnosis not present

## 2018-11-09 DIAGNOSIS — D631 Anemia in chronic kidney disease: Secondary | ICD-10-CM | POA: Diagnosis not present

## 2018-11-09 DIAGNOSIS — N2581 Secondary hyperparathyroidism of renal origin: Secondary | ICD-10-CM | POA: Diagnosis not present

## 2018-11-10 DIAGNOSIS — D631 Anemia in chronic kidney disease: Secondary | ICD-10-CM | POA: Diagnosis not present

## 2018-11-10 DIAGNOSIS — N2581 Secondary hyperparathyroidism of renal origin: Secondary | ICD-10-CM | POA: Diagnosis not present

## 2018-11-10 DIAGNOSIS — N186 End stage renal disease: Secondary | ICD-10-CM | POA: Diagnosis not present

## 2018-11-11 DIAGNOSIS — D631 Anemia in chronic kidney disease: Secondary | ICD-10-CM | POA: Diagnosis not present

## 2018-11-11 DIAGNOSIS — N186 End stage renal disease: Secondary | ICD-10-CM | POA: Diagnosis not present

## 2018-11-11 DIAGNOSIS — N2581 Secondary hyperparathyroidism of renal origin: Secondary | ICD-10-CM | POA: Diagnosis not present

## 2018-11-12 DIAGNOSIS — N186 End stage renal disease: Secondary | ICD-10-CM | POA: Diagnosis not present

## 2018-11-12 DIAGNOSIS — D631 Anemia in chronic kidney disease: Secondary | ICD-10-CM | POA: Diagnosis not present

## 2018-11-12 DIAGNOSIS — N2581 Secondary hyperparathyroidism of renal origin: Secondary | ICD-10-CM | POA: Diagnosis not present

## 2018-11-16 DIAGNOSIS — N2581 Secondary hyperparathyroidism of renal origin: Secondary | ICD-10-CM | POA: Diagnosis not present

## 2018-11-16 DIAGNOSIS — N186 End stage renal disease: Secondary | ICD-10-CM | POA: Diagnosis not present

## 2018-11-16 DIAGNOSIS — D631 Anemia in chronic kidney disease: Secondary | ICD-10-CM | POA: Diagnosis not present

## 2018-11-17 DIAGNOSIS — N2581 Secondary hyperparathyroidism of renal origin: Secondary | ICD-10-CM | POA: Diagnosis not present

## 2018-11-17 DIAGNOSIS — N186 End stage renal disease: Secondary | ICD-10-CM | POA: Diagnosis not present

## 2018-11-17 DIAGNOSIS — D631 Anemia in chronic kidney disease: Secondary | ICD-10-CM | POA: Diagnosis not present

## 2018-11-18 DIAGNOSIS — N2581 Secondary hyperparathyroidism of renal origin: Secondary | ICD-10-CM | POA: Diagnosis not present

## 2018-11-18 DIAGNOSIS — D631 Anemia in chronic kidney disease: Secondary | ICD-10-CM | POA: Diagnosis not present

## 2018-11-18 DIAGNOSIS — N186 End stage renal disease: Secondary | ICD-10-CM | POA: Diagnosis not present

## 2018-11-19 DIAGNOSIS — D631 Anemia in chronic kidney disease: Secondary | ICD-10-CM | POA: Diagnosis not present

## 2018-11-19 DIAGNOSIS — N2581 Secondary hyperparathyroidism of renal origin: Secondary | ICD-10-CM | POA: Diagnosis not present

## 2018-11-19 DIAGNOSIS — N186 End stage renal disease: Secondary | ICD-10-CM | POA: Diagnosis not present

## 2018-11-20 DIAGNOSIS — N186 End stage renal disease: Secondary | ICD-10-CM | POA: Diagnosis not present

## 2018-11-20 DIAGNOSIS — D631 Anemia in chronic kidney disease: Secondary | ICD-10-CM | POA: Diagnosis not present

## 2018-11-20 DIAGNOSIS — N2581 Secondary hyperparathyroidism of renal origin: Secondary | ICD-10-CM | POA: Diagnosis not present

## 2018-11-21 DIAGNOSIS — N186 End stage renal disease: Secondary | ICD-10-CM | POA: Diagnosis not present

## 2018-11-21 DIAGNOSIS — D631 Anemia in chronic kidney disease: Secondary | ICD-10-CM | POA: Diagnosis not present

## 2018-11-21 DIAGNOSIS — N2581 Secondary hyperparathyroidism of renal origin: Secondary | ICD-10-CM | POA: Diagnosis not present

## 2018-11-23 DIAGNOSIS — N2581 Secondary hyperparathyroidism of renal origin: Secondary | ICD-10-CM | POA: Diagnosis not present

## 2018-11-23 DIAGNOSIS — N186 End stage renal disease: Secondary | ICD-10-CM | POA: Diagnosis not present

## 2018-11-23 DIAGNOSIS — D631 Anemia in chronic kidney disease: Secondary | ICD-10-CM | POA: Diagnosis not present

## 2018-11-25 DIAGNOSIS — D631 Anemia in chronic kidney disease: Secondary | ICD-10-CM | POA: Diagnosis not present

## 2018-11-25 DIAGNOSIS — N2581 Secondary hyperparathyroidism of renal origin: Secondary | ICD-10-CM | POA: Diagnosis not present

## 2018-11-25 DIAGNOSIS — N186 End stage renal disease: Secondary | ICD-10-CM | POA: Diagnosis not present

## 2018-11-26 DIAGNOSIS — N186 End stage renal disease: Secondary | ICD-10-CM | POA: Diagnosis not present

## 2018-11-26 DIAGNOSIS — N2581 Secondary hyperparathyroidism of renal origin: Secondary | ICD-10-CM | POA: Diagnosis not present

## 2018-11-26 DIAGNOSIS — D631 Anemia in chronic kidney disease: Secondary | ICD-10-CM | POA: Diagnosis not present

## 2018-11-27 DIAGNOSIS — D631 Anemia in chronic kidney disease: Secondary | ICD-10-CM | POA: Diagnosis not present

## 2018-11-27 DIAGNOSIS — N186 End stage renal disease: Secondary | ICD-10-CM | POA: Diagnosis not present

## 2018-11-27 DIAGNOSIS — N2581 Secondary hyperparathyroidism of renal origin: Secondary | ICD-10-CM | POA: Diagnosis not present

## 2018-11-29 DIAGNOSIS — N186 End stage renal disease: Secondary | ICD-10-CM | POA: Diagnosis not present

## 2018-11-29 DIAGNOSIS — Z992 Dependence on renal dialysis: Secondary | ICD-10-CM | POA: Diagnosis not present

## 2018-11-30 DIAGNOSIS — D631 Anemia in chronic kidney disease: Secondary | ICD-10-CM | POA: Diagnosis not present

## 2018-11-30 DIAGNOSIS — N2581 Secondary hyperparathyroidism of renal origin: Secondary | ICD-10-CM | POA: Diagnosis not present

## 2018-11-30 DIAGNOSIS — E119 Type 2 diabetes mellitus without complications: Secondary | ICD-10-CM | POA: Diagnosis not present

## 2018-11-30 DIAGNOSIS — N186 End stage renal disease: Secondary | ICD-10-CM | POA: Diagnosis not present

## 2018-12-01 DIAGNOSIS — N2581 Secondary hyperparathyroidism of renal origin: Secondary | ICD-10-CM | POA: Diagnosis not present

## 2018-12-01 DIAGNOSIS — N186 End stage renal disease: Secondary | ICD-10-CM | POA: Diagnosis not present

## 2018-12-01 DIAGNOSIS — D631 Anemia in chronic kidney disease: Secondary | ICD-10-CM | POA: Diagnosis not present

## 2018-12-02 DIAGNOSIS — N2581 Secondary hyperparathyroidism of renal origin: Secondary | ICD-10-CM | POA: Diagnosis not present

## 2018-12-02 DIAGNOSIS — N186 End stage renal disease: Secondary | ICD-10-CM | POA: Diagnosis not present

## 2018-12-02 DIAGNOSIS — D631 Anemia in chronic kidney disease: Secondary | ICD-10-CM | POA: Diagnosis not present

## 2018-12-03 DIAGNOSIS — N186 End stage renal disease: Secondary | ICD-10-CM | POA: Diagnosis not present

## 2018-12-03 DIAGNOSIS — N2581 Secondary hyperparathyroidism of renal origin: Secondary | ICD-10-CM | POA: Diagnosis not present

## 2018-12-03 DIAGNOSIS — D631 Anemia in chronic kidney disease: Secondary | ICD-10-CM | POA: Diagnosis not present

## 2018-12-04 DIAGNOSIS — N2581 Secondary hyperparathyroidism of renal origin: Secondary | ICD-10-CM | POA: Diagnosis not present

## 2018-12-04 DIAGNOSIS — D631 Anemia in chronic kidney disease: Secondary | ICD-10-CM | POA: Diagnosis not present

## 2018-12-04 DIAGNOSIS — N186 End stage renal disease: Secondary | ICD-10-CM | POA: Diagnosis not present

## 2018-12-05 DIAGNOSIS — N186 End stage renal disease: Secondary | ICD-10-CM | POA: Diagnosis not present

## 2018-12-05 DIAGNOSIS — N2581 Secondary hyperparathyroidism of renal origin: Secondary | ICD-10-CM | POA: Diagnosis not present

## 2018-12-05 DIAGNOSIS — D631 Anemia in chronic kidney disease: Secondary | ICD-10-CM | POA: Diagnosis not present

## 2018-12-06 DIAGNOSIS — N186 End stage renal disease: Secondary | ICD-10-CM | POA: Diagnosis not present

## 2018-12-06 DIAGNOSIS — N2581 Secondary hyperparathyroidism of renal origin: Secondary | ICD-10-CM | POA: Diagnosis not present

## 2018-12-06 DIAGNOSIS — D631 Anemia in chronic kidney disease: Secondary | ICD-10-CM | POA: Diagnosis not present

## 2018-12-07 DIAGNOSIS — N186 End stage renal disease: Secondary | ICD-10-CM | POA: Diagnosis not present

## 2018-12-07 DIAGNOSIS — N2581 Secondary hyperparathyroidism of renal origin: Secondary | ICD-10-CM | POA: Diagnosis not present

## 2018-12-07 DIAGNOSIS — D631 Anemia in chronic kidney disease: Secondary | ICD-10-CM | POA: Diagnosis not present

## 2018-12-08 DIAGNOSIS — D631 Anemia in chronic kidney disease: Secondary | ICD-10-CM | POA: Diagnosis not present

## 2018-12-08 DIAGNOSIS — N186 End stage renal disease: Secondary | ICD-10-CM | POA: Diagnosis not present

## 2018-12-08 DIAGNOSIS — N2581 Secondary hyperparathyroidism of renal origin: Secondary | ICD-10-CM | POA: Diagnosis not present

## 2018-12-09 DIAGNOSIS — D631 Anemia in chronic kidney disease: Secondary | ICD-10-CM | POA: Diagnosis not present

## 2018-12-09 DIAGNOSIS — N2581 Secondary hyperparathyroidism of renal origin: Secondary | ICD-10-CM | POA: Diagnosis not present

## 2018-12-09 DIAGNOSIS — N186 End stage renal disease: Secondary | ICD-10-CM | POA: Diagnosis not present

## 2018-12-10 DIAGNOSIS — N186 End stage renal disease: Secondary | ICD-10-CM | POA: Diagnosis not present

## 2018-12-10 DIAGNOSIS — D631 Anemia in chronic kidney disease: Secondary | ICD-10-CM | POA: Diagnosis not present

## 2018-12-10 DIAGNOSIS — N2581 Secondary hyperparathyroidism of renal origin: Secondary | ICD-10-CM | POA: Diagnosis not present

## 2018-12-11 DIAGNOSIS — D631 Anemia in chronic kidney disease: Secondary | ICD-10-CM | POA: Diagnosis not present

## 2018-12-11 DIAGNOSIS — N2581 Secondary hyperparathyroidism of renal origin: Secondary | ICD-10-CM | POA: Diagnosis not present

## 2018-12-11 DIAGNOSIS — N186 End stage renal disease: Secondary | ICD-10-CM | POA: Diagnosis not present

## 2018-12-12 DIAGNOSIS — N186 End stage renal disease: Secondary | ICD-10-CM | POA: Diagnosis not present

## 2018-12-12 DIAGNOSIS — N2581 Secondary hyperparathyroidism of renal origin: Secondary | ICD-10-CM | POA: Diagnosis not present

## 2018-12-12 DIAGNOSIS — D631 Anemia in chronic kidney disease: Secondary | ICD-10-CM | POA: Diagnosis not present

## 2018-12-13 DIAGNOSIS — D631 Anemia in chronic kidney disease: Secondary | ICD-10-CM | POA: Diagnosis not present

## 2018-12-13 DIAGNOSIS — N2581 Secondary hyperparathyroidism of renal origin: Secondary | ICD-10-CM | POA: Diagnosis not present

## 2018-12-13 DIAGNOSIS — N186 End stage renal disease: Secondary | ICD-10-CM | POA: Diagnosis not present

## 2018-12-14 DIAGNOSIS — D631 Anemia in chronic kidney disease: Secondary | ICD-10-CM | POA: Diagnosis not present

## 2018-12-14 DIAGNOSIS — N2581 Secondary hyperparathyroidism of renal origin: Secondary | ICD-10-CM | POA: Diagnosis not present

## 2018-12-14 DIAGNOSIS — N186 End stage renal disease: Secondary | ICD-10-CM | POA: Diagnosis not present

## 2018-12-15 DIAGNOSIS — D631 Anemia in chronic kidney disease: Secondary | ICD-10-CM | POA: Diagnosis not present

## 2018-12-15 DIAGNOSIS — N186 End stage renal disease: Secondary | ICD-10-CM | POA: Diagnosis not present

## 2018-12-15 DIAGNOSIS — N2581 Secondary hyperparathyroidism of renal origin: Secondary | ICD-10-CM | POA: Diagnosis not present

## 2018-12-16 DIAGNOSIS — D631 Anemia in chronic kidney disease: Secondary | ICD-10-CM | POA: Diagnosis not present

## 2018-12-16 DIAGNOSIS — N186 End stage renal disease: Secondary | ICD-10-CM | POA: Diagnosis not present

## 2018-12-16 DIAGNOSIS — N2581 Secondary hyperparathyroidism of renal origin: Secondary | ICD-10-CM | POA: Diagnosis not present

## 2018-12-17 DIAGNOSIS — D631 Anemia in chronic kidney disease: Secondary | ICD-10-CM | POA: Diagnosis not present

## 2018-12-17 DIAGNOSIS — N186 End stage renal disease: Secondary | ICD-10-CM | POA: Diagnosis not present

## 2018-12-17 DIAGNOSIS — N2581 Secondary hyperparathyroidism of renal origin: Secondary | ICD-10-CM | POA: Diagnosis not present

## 2018-12-18 DIAGNOSIS — N186 End stage renal disease: Secondary | ICD-10-CM | POA: Diagnosis not present

## 2018-12-18 DIAGNOSIS — N2581 Secondary hyperparathyroidism of renal origin: Secondary | ICD-10-CM | POA: Diagnosis not present

## 2018-12-18 DIAGNOSIS — D631 Anemia in chronic kidney disease: Secondary | ICD-10-CM | POA: Diagnosis not present

## 2018-12-19 DIAGNOSIS — N186 End stage renal disease: Secondary | ICD-10-CM | POA: Diagnosis not present

## 2018-12-19 DIAGNOSIS — N185 Chronic kidney disease, stage 5: Secondary | ICD-10-CM | POA: Diagnosis not present

## 2018-12-19 DIAGNOSIS — R591 Generalized enlarged lymph nodes: Secondary | ICD-10-CM | POA: Diagnosis not present

## 2018-12-19 DIAGNOSIS — L049 Acute lymphadenitis, unspecified: Secondary | ICD-10-CM | POA: Diagnosis not present

## 2018-12-19 DIAGNOSIS — D631 Anemia in chronic kidney disease: Secondary | ICD-10-CM | POA: Diagnosis not present

## 2018-12-19 DIAGNOSIS — N2581 Secondary hyperparathyroidism of renal origin: Secondary | ICD-10-CM | POA: Diagnosis not present

## 2018-12-20 DIAGNOSIS — D631 Anemia in chronic kidney disease: Secondary | ICD-10-CM | POA: Diagnosis not present

## 2018-12-20 DIAGNOSIS — N186 End stage renal disease: Secondary | ICD-10-CM | POA: Diagnosis not present

## 2018-12-20 DIAGNOSIS — N2581 Secondary hyperparathyroidism of renal origin: Secondary | ICD-10-CM | POA: Diagnosis not present

## 2018-12-21 DIAGNOSIS — N186 End stage renal disease: Secondary | ICD-10-CM | POA: Diagnosis not present

## 2018-12-21 DIAGNOSIS — D631 Anemia in chronic kidney disease: Secondary | ICD-10-CM | POA: Diagnosis not present

## 2018-12-21 DIAGNOSIS — N2581 Secondary hyperparathyroidism of renal origin: Secondary | ICD-10-CM | POA: Diagnosis not present

## 2018-12-22 DIAGNOSIS — D631 Anemia in chronic kidney disease: Secondary | ICD-10-CM | POA: Diagnosis not present

## 2018-12-22 DIAGNOSIS — N2581 Secondary hyperparathyroidism of renal origin: Secondary | ICD-10-CM | POA: Diagnosis not present

## 2018-12-22 DIAGNOSIS — N186 End stage renal disease: Secondary | ICD-10-CM | POA: Diagnosis not present

## 2018-12-23 DIAGNOSIS — N2581 Secondary hyperparathyroidism of renal origin: Secondary | ICD-10-CM | POA: Diagnosis not present

## 2018-12-23 DIAGNOSIS — D631 Anemia in chronic kidney disease: Secondary | ICD-10-CM | POA: Diagnosis not present

## 2018-12-23 DIAGNOSIS — N186 End stage renal disease: Secondary | ICD-10-CM | POA: Diagnosis not present

## 2018-12-24 DIAGNOSIS — N186 End stage renal disease: Secondary | ICD-10-CM | POA: Diagnosis not present

## 2018-12-24 DIAGNOSIS — N2581 Secondary hyperparathyroidism of renal origin: Secondary | ICD-10-CM | POA: Diagnosis not present

## 2018-12-24 DIAGNOSIS — D631 Anemia in chronic kidney disease: Secondary | ICD-10-CM | POA: Diagnosis not present

## 2018-12-25 DIAGNOSIS — D631 Anemia in chronic kidney disease: Secondary | ICD-10-CM | POA: Diagnosis not present

## 2018-12-25 DIAGNOSIS — N186 End stage renal disease: Secondary | ICD-10-CM | POA: Diagnosis not present

## 2018-12-25 DIAGNOSIS — N2581 Secondary hyperparathyroidism of renal origin: Secondary | ICD-10-CM | POA: Diagnosis not present

## 2018-12-26 DIAGNOSIS — N2581 Secondary hyperparathyroidism of renal origin: Secondary | ICD-10-CM | POA: Diagnosis not present

## 2018-12-26 DIAGNOSIS — N186 End stage renal disease: Secondary | ICD-10-CM | POA: Diagnosis not present

## 2018-12-26 DIAGNOSIS — D631 Anemia in chronic kidney disease: Secondary | ICD-10-CM | POA: Diagnosis not present

## 2018-12-27 DIAGNOSIS — D631 Anemia in chronic kidney disease: Secondary | ICD-10-CM | POA: Diagnosis not present

## 2018-12-27 DIAGNOSIS — N2581 Secondary hyperparathyroidism of renal origin: Secondary | ICD-10-CM | POA: Diagnosis not present

## 2018-12-27 DIAGNOSIS — N186 End stage renal disease: Secondary | ICD-10-CM | POA: Diagnosis not present

## 2018-12-28 DIAGNOSIS — N2581 Secondary hyperparathyroidism of renal origin: Secondary | ICD-10-CM | POA: Diagnosis not present

## 2018-12-28 DIAGNOSIS — D631 Anemia in chronic kidney disease: Secondary | ICD-10-CM | POA: Diagnosis not present

## 2018-12-28 DIAGNOSIS — N186 End stage renal disease: Secondary | ICD-10-CM | POA: Diagnosis not present

## 2018-12-29 DIAGNOSIS — N186 End stage renal disease: Secondary | ICD-10-CM | POA: Diagnosis not present

## 2018-12-29 DIAGNOSIS — N2581 Secondary hyperparathyroidism of renal origin: Secondary | ICD-10-CM | POA: Diagnosis not present

## 2018-12-29 DIAGNOSIS — D631 Anemia in chronic kidney disease: Secondary | ICD-10-CM | POA: Diagnosis not present

## 2018-12-30 DIAGNOSIS — D631 Anemia in chronic kidney disease: Secondary | ICD-10-CM | POA: Diagnosis not present

## 2018-12-30 DIAGNOSIS — Z992 Dependence on renal dialysis: Secondary | ICD-10-CM | POA: Diagnosis not present

## 2018-12-30 DIAGNOSIS — N186 End stage renal disease: Secondary | ICD-10-CM | POA: Diagnosis not present

## 2018-12-30 DIAGNOSIS — N2581 Secondary hyperparathyroidism of renal origin: Secondary | ICD-10-CM | POA: Diagnosis not present

## 2019-01-17 DIAGNOSIS — E119 Type 2 diabetes mellitus without complications: Secondary | ICD-10-CM | POA: Diagnosis not present

## 2019-03-02 DIAGNOSIS — N186 End stage renal disease: Secondary | ICD-10-CM | POA: Diagnosis not present

## 2019-03-02 DIAGNOSIS — N2581 Secondary hyperparathyroidism of renal origin: Secondary | ICD-10-CM | POA: Diagnosis not present

## 2019-03-02 DIAGNOSIS — D631 Anemia in chronic kidney disease: Secondary | ICD-10-CM | POA: Diagnosis not present

## 2019-03-05 DIAGNOSIS — N186 End stage renal disease: Secondary | ICD-10-CM | POA: Diagnosis not present

## 2019-03-05 DIAGNOSIS — N2581 Secondary hyperparathyroidism of renal origin: Secondary | ICD-10-CM | POA: Diagnosis not present

## 2019-03-05 DIAGNOSIS — D631 Anemia in chronic kidney disease: Secondary | ICD-10-CM | POA: Diagnosis not present

## 2019-03-07 DIAGNOSIS — D631 Anemia in chronic kidney disease: Secondary | ICD-10-CM | POA: Diagnosis not present

## 2019-03-07 DIAGNOSIS — N2581 Secondary hyperparathyroidism of renal origin: Secondary | ICD-10-CM | POA: Diagnosis not present

## 2019-03-07 DIAGNOSIS — N186 End stage renal disease: Secondary | ICD-10-CM | POA: Diagnosis not present

## 2019-03-08 DIAGNOSIS — Z4932 Encounter for adequacy testing for peritoneal dialysis: Secondary | ICD-10-CM | POA: Diagnosis not present

## 2019-03-08 DIAGNOSIS — N2581 Secondary hyperparathyroidism of renal origin: Secondary | ICD-10-CM | POA: Diagnosis not present

## 2019-03-08 DIAGNOSIS — N186 End stage renal disease: Secondary | ICD-10-CM | POA: Diagnosis not present

## 2019-03-08 DIAGNOSIS — E119 Type 2 diabetes mellitus without complications: Secondary | ICD-10-CM | POA: Diagnosis not present

## 2019-03-08 DIAGNOSIS — D631 Anemia in chronic kidney disease: Secondary | ICD-10-CM | POA: Diagnosis not present

## 2019-03-11 DIAGNOSIS — D631 Anemia in chronic kidney disease: Secondary | ICD-10-CM | POA: Diagnosis not present

## 2019-03-11 DIAGNOSIS — N2581 Secondary hyperparathyroidism of renal origin: Secondary | ICD-10-CM | POA: Diagnosis not present

## 2019-03-11 DIAGNOSIS — N186 End stage renal disease: Secondary | ICD-10-CM | POA: Diagnosis not present

## 2019-03-15 ENCOUNTER — Other Ambulatory Visit (HOSPITAL_COMMUNITY): Payer: Self-pay | Admitting: Physician Assistant

## 2019-03-15 ENCOUNTER — Ambulatory Visit (HOSPITAL_COMMUNITY): Admission: RE | Admit: 2019-03-15 | Payer: Medicare Other | Source: Ambulatory Visit

## 2019-03-15 ENCOUNTER — Other Ambulatory Visit: Payer: Self-pay | Admitting: Physician Assistant

## 2019-03-15 ENCOUNTER — Encounter (HOSPITAL_COMMUNITY): Payer: Self-pay

## 2019-03-15 DIAGNOSIS — R634 Abnormal weight loss: Secondary | ICD-10-CM | POA: Diagnosis not present

## 2019-03-15 DIAGNOSIS — R591 Generalized enlarged lymph nodes: Secondary | ICD-10-CM | POA: Diagnosis not present

## 2019-03-15 DIAGNOSIS — N912 Amenorrhea, unspecified: Secondary | ICD-10-CM | POA: Diagnosis not present

## 2019-03-15 DIAGNOSIS — M25551 Pain in right hip: Secondary | ICD-10-CM | POA: Diagnosis not present

## 2019-03-15 DIAGNOSIS — N185 Chronic kidney disease, stage 5: Secondary | ICD-10-CM | POA: Diagnosis not present

## 2019-03-15 DIAGNOSIS — L049 Acute lymphadenitis, unspecified: Secondary | ICD-10-CM | POA: Diagnosis not present

## 2019-03-15 DIAGNOSIS — Z6837 Body mass index (BMI) 37.0-37.9, adult: Secondary | ICD-10-CM | POA: Diagnosis not present

## 2019-03-16 ENCOUNTER — Ambulatory Visit (HOSPITAL_COMMUNITY): Payer: Medicare Other | Attending: Physician Assistant

## 2019-03-16 DIAGNOSIS — N185 Chronic kidney disease, stage 5: Secondary | ICD-10-CM | POA: Diagnosis not present

## 2019-03-16 DIAGNOSIS — N028 Recurrent and persistent hematuria with other morphologic changes: Secondary | ICD-10-CM | POA: Diagnosis not present

## 2019-03-16 DIAGNOSIS — R599 Enlarged lymph nodes, unspecified: Secondary | ICD-10-CM | POA: Insufficient documentation

## 2019-03-16 DIAGNOSIS — R918 Other nonspecific abnormal finding of lung field: Secondary | ICD-10-CM | POA: Diagnosis not present

## 2019-03-16 DIAGNOSIS — R591 Generalized enlarged lymph nodes: Secondary | ICD-10-CM | POA: Diagnosis not present

## 2019-03-16 DIAGNOSIS — K760 Fatty (change of) liver, not elsewhere classified: Secondary | ICD-10-CM | POA: Diagnosis not present

## 2019-03-16 DIAGNOSIS — I517 Cardiomegaly: Secondary | ICD-10-CM | POA: Diagnosis not present

## 2019-03-17 DIAGNOSIS — N186 End stage renal disease: Secondary | ICD-10-CM | POA: Diagnosis not present

## 2019-03-17 DIAGNOSIS — N2581 Secondary hyperparathyroidism of renal origin: Secondary | ICD-10-CM | POA: Diagnosis not present

## 2019-03-17 DIAGNOSIS — D631 Anemia in chronic kidney disease: Secondary | ICD-10-CM | POA: Diagnosis not present

## 2019-03-21 DIAGNOSIS — N2581 Secondary hyperparathyroidism of renal origin: Secondary | ICD-10-CM | POA: Diagnosis not present

## 2019-03-21 DIAGNOSIS — N185 Chronic kidney disease, stage 5: Secondary | ICD-10-CM | POA: Diagnosis not present

## 2019-03-21 DIAGNOSIS — R591 Generalized enlarged lymph nodes: Secondary | ICD-10-CM | POA: Diagnosis not present

## 2019-03-21 DIAGNOSIS — Z992 Dependence on renal dialysis: Secondary | ICD-10-CM | POA: Diagnosis not present

## 2019-03-21 DIAGNOSIS — C419 Malignant neoplasm of bone and articular cartilage, unspecified: Secondary | ICD-10-CM | POA: Diagnosis not present

## 2019-03-21 DIAGNOSIS — N186 End stage renal disease: Secondary | ICD-10-CM | POA: Diagnosis not present

## 2019-03-21 DIAGNOSIS — N028 Recurrent and persistent hematuria with other morphologic changes: Secondary | ICD-10-CM | POA: Diagnosis not present

## 2019-03-21 DIAGNOSIS — D631 Anemia in chronic kidney disease: Secondary | ICD-10-CM | POA: Diagnosis not present

## 2019-03-21 DIAGNOSIS — G893 Neoplasm related pain (acute) (chronic): Secondary | ICD-10-CM | POA: Diagnosis not present

## 2019-03-22 DIAGNOSIS — N186 End stage renal disease: Secondary | ICD-10-CM | POA: Diagnosis not present

## 2019-03-22 DIAGNOSIS — N2581 Secondary hyperparathyroidism of renal origin: Secondary | ICD-10-CM | POA: Diagnosis not present

## 2019-03-22 DIAGNOSIS — D631 Anemia in chronic kidney disease: Secondary | ICD-10-CM | POA: Diagnosis not present

## 2019-03-27 DIAGNOSIS — G893 Neoplasm related pain (acute) (chronic): Secondary | ICD-10-CM | POA: Insufficient documentation

## 2019-03-27 DIAGNOSIS — N2581 Secondary hyperparathyroidism of renal origin: Secondary | ICD-10-CM | POA: Diagnosis not present

## 2019-03-27 DIAGNOSIS — N186 End stage renal disease: Secondary | ICD-10-CM | POA: Diagnosis not present

## 2019-03-27 DIAGNOSIS — C419 Malignant neoplasm of bone and articular cartilage, unspecified: Secondary | ICD-10-CM | POA: Insufficient documentation

## 2019-03-27 DIAGNOSIS — D631 Anemia in chronic kidney disease: Secondary | ICD-10-CM | POA: Diagnosis not present

## 2019-03-31 DIAGNOSIS — D631 Anemia in chronic kidney disease: Secondary | ICD-10-CM | POA: Diagnosis not present

## 2019-03-31 DIAGNOSIS — N2581 Secondary hyperparathyroidism of renal origin: Secondary | ICD-10-CM | POA: Diagnosis not present

## 2019-03-31 DIAGNOSIS — N186 End stage renal disease: Secondary | ICD-10-CM | POA: Diagnosis not present

## 2019-04-01 DIAGNOSIS — Z992 Dependence on renal dialysis: Secondary | ICD-10-CM | POA: Diagnosis not present

## 2019-04-01 DIAGNOSIS — N186 End stage renal disease: Secondary | ICD-10-CM | POA: Diagnosis not present

## 2019-04-02 DIAGNOSIS — N2581 Secondary hyperparathyroidism of renal origin: Secondary | ICD-10-CM | POA: Diagnosis not present

## 2019-04-02 DIAGNOSIS — D631 Anemia in chronic kidney disease: Secondary | ICD-10-CM | POA: Diagnosis not present

## 2019-04-02 DIAGNOSIS — N186 End stage renal disease: Secondary | ICD-10-CM | POA: Diagnosis not present

## 2019-04-05 DIAGNOSIS — E119 Type 2 diabetes mellitus without complications: Secondary | ICD-10-CM | POA: Diagnosis not present

## 2019-04-05 DIAGNOSIS — N2581 Secondary hyperparathyroidism of renal origin: Secondary | ICD-10-CM | POA: Diagnosis not present

## 2019-04-05 DIAGNOSIS — D631 Anemia in chronic kidney disease: Secondary | ICD-10-CM | POA: Diagnosis not present

## 2019-04-05 DIAGNOSIS — N186 End stage renal disease: Secondary | ICD-10-CM | POA: Diagnosis not present

## 2019-04-06 DIAGNOSIS — N2581 Secondary hyperparathyroidism of renal origin: Secondary | ICD-10-CM | POA: Diagnosis not present

## 2019-04-06 DIAGNOSIS — D631 Anemia in chronic kidney disease: Secondary | ICD-10-CM | POA: Diagnosis not present

## 2019-04-06 DIAGNOSIS — N186 End stage renal disease: Secondary | ICD-10-CM | POA: Diagnosis not present

## 2019-04-07 DIAGNOSIS — N2581 Secondary hyperparathyroidism of renal origin: Secondary | ICD-10-CM | POA: Diagnosis not present

## 2019-04-07 DIAGNOSIS — D631 Anemia in chronic kidney disease: Secondary | ICD-10-CM | POA: Diagnosis not present

## 2019-04-07 DIAGNOSIS — N186 End stage renal disease: Secondary | ICD-10-CM | POA: Diagnosis not present

## 2019-04-08 DIAGNOSIS — N186 End stage renal disease: Secondary | ICD-10-CM | POA: Diagnosis not present

## 2019-04-08 DIAGNOSIS — D631 Anemia in chronic kidney disease: Secondary | ICD-10-CM | POA: Diagnosis not present

## 2019-04-08 DIAGNOSIS — N2581 Secondary hyperparathyroidism of renal origin: Secondary | ICD-10-CM | POA: Diagnosis not present

## 2019-04-09 DIAGNOSIS — N2581 Secondary hyperparathyroidism of renal origin: Secondary | ICD-10-CM | POA: Diagnosis not present

## 2019-04-09 DIAGNOSIS — R000 Tachycardia, unspecified: Secondary | ICD-10-CM | POA: Diagnosis not present

## 2019-04-09 DIAGNOSIS — R0602 Shortness of breath: Secondary | ICD-10-CM | POA: Diagnosis not present

## 2019-04-09 DIAGNOSIS — N186 End stage renal disease: Secondary | ICD-10-CM | POA: Diagnosis not present

## 2019-04-09 DIAGNOSIS — Z992 Dependence on renal dialysis: Secondary | ICD-10-CM | POA: Diagnosis not present

## 2019-04-09 DIAGNOSIS — D649 Anemia, unspecified: Secondary | ICD-10-CM | POA: Diagnosis not present

## 2019-04-09 DIAGNOSIS — D631 Anemia in chronic kidney disease: Secondary | ICD-10-CM | POA: Diagnosis not present

## 2019-04-09 DIAGNOSIS — Z79899 Other long term (current) drug therapy: Secondary | ICD-10-CM | POA: Diagnosis not present

## 2019-04-09 DIAGNOSIS — F17200 Nicotine dependence, unspecified, uncomplicated: Secondary | ICD-10-CM | POA: Diagnosis not present

## 2019-04-09 DIAGNOSIS — I120 Hypertensive chronic kidney disease with stage 5 chronic kidney disease or end stage renal disease: Secondary | ICD-10-CM | POA: Diagnosis not present

## 2019-04-09 DIAGNOSIS — N189 Chronic kidney disease, unspecified: Secondary | ICD-10-CM | POA: Diagnosis not present

## 2019-04-10 DIAGNOSIS — N186 End stage renal disease: Secondary | ICD-10-CM | POA: Diagnosis not present

## 2019-04-10 DIAGNOSIS — D631 Anemia in chronic kidney disease: Secondary | ICD-10-CM | POA: Diagnosis not present

## 2019-04-10 DIAGNOSIS — N2581 Secondary hyperparathyroidism of renal origin: Secondary | ICD-10-CM | POA: Diagnosis not present

## 2019-04-11 DIAGNOSIS — D631 Anemia in chronic kidney disease: Secondary | ICD-10-CM | POA: Diagnosis not present

## 2019-04-11 DIAGNOSIS — N186 End stage renal disease: Secondary | ICD-10-CM | POA: Diagnosis not present

## 2019-04-11 DIAGNOSIS — R599 Enlarged lymph nodes, unspecified: Secondary | ICD-10-CM | POA: Diagnosis not present

## 2019-04-11 DIAGNOSIS — R634 Abnormal weight loss: Secondary | ICD-10-CM | POA: Diagnosis not present

## 2019-04-11 DIAGNOSIS — N2581 Secondary hyperparathyroidism of renal origin: Secondary | ICD-10-CM | POA: Diagnosis not present

## 2019-04-11 DIAGNOSIS — R011 Cardiac murmur, unspecified: Secondary | ICD-10-CM | POA: Diagnosis not present

## 2019-04-11 DIAGNOSIS — F339 Major depressive disorder, recurrent, unspecified: Secondary | ICD-10-CM | POA: Diagnosis not present

## 2019-04-11 DIAGNOSIS — N185 Chronic kidney disease, stage 5: Secondary | ICD-10-CM | POA: Diagnosis not present

## 2019-04-12 DIAGNOSIS — N2581 Secondary hyperparathyroidism of renal origin: Secondary | ICD-10-CM | POA: Diagnosis not present

## 2019-04-12 DIAGNOSIS — N186 End stage renal disease: Secondary | ICD-10-CM | POA: Diagnosis not present

## 2019-04-12 DIAGNOSIS — D631 Anemia in chronic kidney disease: Secondary | ICD-10-CM | POA: Diagnosis not present

## 2019-04-13 DIAGNOSIS — N2581 Secondary hyperparathyroidism of renal origin: Secondary | ICD-10-CM | POA: Diagnosis not present

## 2019-04-13 DIAGNOSIS — N186 End stage renal disease: Secondary | ICD-10-CM | POA: Diagnosis not present

## 2019-04-13 DIAGNOSIS — D631 Anemia in chronic kidney disease: Secondary | ICD-10-CM | POA: Diagnosis not present

## 2019-04-14 DIAGNOSIS — D631 Anemia in chronic kidney disease: Secondary | ICD-10-CM | POA: Diagnosis not present

## 2019-04-14 DIAGNOSIS — N2581 Secondary hyperparathyroidism of renal origin: Secondary | ICD-10-CM | POA: Diagnosis not present

## 2019-04-14 DIAGNOSIS — N186 End stage renal disease: Secondary | ICD-10-CM | POA: Diagnosis not present

## 2019-04-15 DIAGNOSIS — D631 Anemia in chronic kidney disease: Secondary | ICD-10-CM | POA: Diagnosis not present

## 2019-04-15 DIAGNOSIS — N2581 Secondary hyperparathyroidism of renal origin: Secondary | ICD-10-CM | POA: Diagnosis not present

## 2019-04-15 DIAGNOSIS — N186 End stage renal disease: Secondary | ICD-10-CM | POA: Diagnosis not present

## 2019-04-16 DIAGNOSIS — D631 Anemia in chronic kidney disease: Secondary | ICD-10-CM | POA: Diagnosis not present

## 2019-04-16 DIAGNOSIS — N2581 Secondary hyperparathyroidism of renal origin: Secondary | ICD-10-CM | POA: Diagnosis not present

## 2019-04-16 DIAGNOSIS — N186 End stage renal disease: Secondary | ICD-10-CM | POA: Diagnosis not present

## 2019-04-17 DIAGNOSIS — R011 Cardiac murmur, unspecified: Secondary | ICD-10-CM | POA: Diagnosis not present

## 2019-04-17 DIAGNOSIS — I340 Nonrheumatic mitral (valve) insufficiency: Secondary | ICD-10-CM | POA: Diagnosis not present

## 2019-04-17 DIAGNOSIS — N2581 Secondary hyperparathyroidism of renal origin: Secondary | ICD-10-CM | POA: Diagnosis not present

## 2019-04-17 DIAGNOSIS — D631 Anemia in chronic kidney disease: Secondary | ICD-10-CM | POA: Diagnosis not present

## 2019-04-17 DIAGNOSIS — N186 End stage renal disease: Secondary | ICD-10-CM | POA: Diagnosis not present

## 2019-04-18 DIAGNOSIS — N186 End stage renal disease: Secondary | ICD-10-CM | POA: Diagnosis not present

## 2019-04-18 DIAGNOSIS — D631 Anemia in chronic kidney disease: Secondary | ICD-10-CM | POA: Diagnosis not present

## 2019-04-18 DIAGNOSIS — N2581 Secondary hyperparathyroidism of renal origin: Secondary | ICD-10-CM | POA: Diagnosis not present

## 2019-04-19 DIAGNOSIS — D631 Anemia in chronic kidney disease: Secondary | ICD-10-CM | POA: Diagnosis not present

## 2019-04-19 DIAGNOSIS — N2581 Secondary hyperparathyroidism of renal origin: Secondary | ICD-10-CM | POA: Diagnosis not present

## 2019-04-19 DIAGNOSIS — N186 End stage renal disease: Secondary | ICD-10-CM | POA: Diagnosis not present

## 2019-04-19 DIAGNOSIS — F1120 Opioid dependence, uncomplicated: Secondary | ICD-10-CM | POA: Insufficient documentation

## 2019-04-20 DIAGNOSIS — N2581 Secondary hyperparathyroidism of renal origin: Secondary | ICD-10-CM | POA: Diagnosis not present

## 2019-04-20 DIAGNOSIS — N186 End stage renal disease: Secondary | ICD-10-CM | POA: Diagnosis not present

## 2019-04-20 DIAGNOSIS — D631 Anemia in chronic kidney disease: Secondary | ICD-10-CM | POA: Diagnosis not present

## 2019-04-21 DIAGNOSIS — N186 End stage renal disease: Secondary | ICD-10-CM | POA: Diagnosis not present

## 2019-04-21 DIAGNOSIS — D631 Anemia in chronic kidney disease: Secondary | ICD-10-CM | POA: Diagnosis not present

## 2019-04-21 DIAGNOSIS — N2581 Secondary hyperparathyroidism of renal origin: Secondary | ICD-10-CM | POA: Diagnosis not present

## 2019-04-22 DIAGNOSIS — D631 Anemia in chronic kidney disease: Secondary | ICD-10-CM | POA: Diagnosis not present

## 2019-04-22 DIAGNOSIS — N186 End stage renal disease: Secondary | ICD-10-CM | POA: Diagnosis not present

## 2019-04-22 DIAGNOSIS — N2581 Secondary hyperparathyroidism of renal origin: Secondary | ICD-10-CM | POA: Diagnosis not present

## 2019-04-23 DIAGNOSIS — N186 End stage renal disease: Secondary | ICD-10-CM | POA: Diagnosis not present

## 2019-04-23 DIAGNOSIS — N2581 Secondary hyperparathyroidism of renal origin: Secondary | ICD-10-CM | POA: Diagnosis not present

## 2019-04-23 DIAGNOSIS — D631 Anemia in chronic kidney disease: Secondary | ICD-10-CM | POA: Diagnosis not present

## 2019-04-24 DIAGNOSIS — N186 End stage renal disease: Secondary | ICD-10-CM | POA: Diagnosis not present

## 2019-04-24 DIAGNOSIS — N2581 Secondary hyperparathyroidism of renal origin: Secondary | ICD-10-CM | POA: Diagnosis not present

## 2019-04-24 DIAGNOSIS — D631 Anemia in chronic kidney disease: Secondary | ICD-10-CM | POA: Diagnosis not present

## 2019-04-25 DIAGNOSIS — N186 End stage renal disease: Secondary | ICD-10-CM | POA: Diagnosis not present

## 2019-04-25 DIAGNOSIS — D631 Anemia in chronic kidney disease: Secondary | ICD-10-CM | POA: Diagnosis not present

## 2019-04-25 DIAGNOSIS — N2581 Secondary hyperparathyroidism of renal origin: Secondary | ICD-10-CM | POA: Diagnosis not present

## 2019-04-26 DIAGNOSIS — N2581 Secondary hyperparathyroidism of renal origin: Secondary | ICD-10-CM | POA: Diagnosis not present

## 2019-04-26 DIAGNOSIS — D631 Anemia in chronic kidney disease: Secondary | ICD-10-CM | POA: Diagnosis not present

## 2019-04-26 DIAGNOSIS — N186 End stage renal disease: Secondary | ICD-10-CM | POA: Diagnosis not present

## 2019-04-27 DIAGNOSIS — N186 End stage renal disease: Secondary | ICD-10-CM | POA: Diagnosis not present

## 2019-04-27 DIAGNOSIS — D631 Anemia in chronic kidney disease: Secondary | ICD-10-CM | POA: Diagnosis not present

## 2019-04-27 DIAGNOSIS — N2581 Secondary hyperparathyroidism of renal origin: Secondary | ICD-10-CM | POA: Diagnosis not present

## 2019-04-28 DIAGNOSIS — N2581 Secondary hyperparathyroidism of renal origin: Secondary | ICD-10-CM | POA: Diagnosis not present

## 2019-04-28 DIAGNOSIS — N186 End stage renal disease: Secondary | ICD-10-CM | POA: Diagnosis not present

## 2019-04-28 DIAGNOSIS — D631 Anemia in chronic kidney disease: Secondary | ICD-10-CM | POA: Diagnosis not present

## 2019-04-29 DIAGNOSIS — N186 End stage renal disease: Secondary | ICD-10-CM | POA: Diagnosis not present

## 2019-04-29 DIAGNOSIS — D631 Anemia in chronic kidney disease: Secondary | ICD-10-CM | POA: Diagnosis not present

## 2019-04-29 DIAGNOSIS — Z992 Dependence on renal dialysis: Secondary | ICD-10-CM | POA: Diagnosis not present

## 2019-04-29 DIAGNOSIS — N2581 Secondary hyperparathyroidism of renal origin: Secondary | ICD-10-CM | POA: Diagnosis not present

## 2019-04-30 DIAGNOSIS — N2581 Secondary hyperparathyroidism of renal origin: Secondary | ICD-10-CM | POA: Diagnosis not present

## 2019-04-30 DIAGNOSIS — D631 Anemia in chronic kidney disease: Secondary | ICD-10-CM | POA: Diagnosis not present

## 2019-04-30 DIAGNOSIS — N186 End stage renal disease: Secondary | ICD-10-CM | POA: Diagnosis not present

## 2019-05-01 DIAGNOSIS — D631 Anemia in chronic kidney disease: Secondary | ICD-10-CM | POA: Diagnosis not present

## 2019-05-01 DIAGNOSIS — M25551 Pain in right hip: Secondary | ICD-10-CM | POA: Diagnosis not present

## 2019-05-01 DIAGNOSIS — N2581 Secondary hyperparathyroidism of renal origin: Secondary | ICD-10-CM | POA: Diagnosis not present

## 2019-05-01 DIAGNOSIS — N186 End stage renal disease: Secondary | ICD-10-CM | POA: Diagnosis not present

## 2019-05-02 DIAGNOSIS — N2581 Secondary hyperparathyroidism of renal origin: Secondary | ICD-10-CM | POA: Diagnosis not present

## 2019-05-02 DIAGNOSIS — D631 Anemia in chronic kidney disease: Secondary | ICD-10-CM | POA: Diagnosis not present

## 2019-05-02 DIAGNOSIS — R609 Edema, unspecified: Secondary | ICD-10-CM | POA: Diagnosis not present

## 2019-05-02 DIAGNOSIS — R600 Localized edema: Secondary | ICD-10-CM | POA: Diagnosis not present

## 2019-05-02 DIAGNOSIS — N186 End stage renal disease: Secondary | ICD-10-CM | POA: Diagnosis not present

## 2019-05-02 DIAGNOSIS — M2548 Effusion, other site: Secondary | ICD-10-CM | POA: Diagnosis not present

## 2019-05-02 DIAGNOSIS — M25551 Pain in right hip: Secondary | ICD-10-CM | POA: Diagnosis not present

## 2019-05-03 DIAGNOSIS — N186 End stage renal disease: Secondary | ICD-10-CM | POA: Diagnosis not present

## 2019-05-03 DIAGNOSIS — D631 Anemia in chronic kidney disease: Secondary | ICD-10-CM | POA: Diagnosis not present

## 2019-05-03 DIAGNOSIS — E119 Type 2 diabetes mellitus without complications: Secondary | ICD-10-CM | POA: Diagnosis not present

## 2019-05-03 DIAGNOSIS — N2581 Secondary hyperparathyroidism of renal origin: Secondary | ICD-10-CM | POA: Diagnosis not present

## 2019-05-04 DIAGNOSIS — D631 Anemia in chronic kidney disease: Secondary | ICD-10-CM | POA: Diagnosis not present

## 2019-05-04 DIAGNOSIS — N2581 Secondary hyperparathyroidism of renal origin: Secondary | ICD-10-CM | POA: Diagnosis not present

## 2019-05-04 DIAGNOSIS — N186 End stage renal disease: Secondary | ICD-10-CM | POA: Diagnosis not present

## 2019-05-05 DIAGNOSIS — D631 Anemia in chronic kidney disease: Secondary | ICD-10-CM | POA: Diagnosis not present

## 2019-05-05 DIAGNOSIS — N186 End stage renal disease: Secondary | ICD-10-CM | POA: Diagnosis not present

## 2019-05-05 DIAGNOSIS — N2581 Secondary hyperparathyroidism of renal origin: Secondary | ICD-10-CM | POA: Diagnosis not present

## 2019-05-06 DIAGNOSIS — N186 End stage renal disease: Secondary | ICD-10-CM | POA: Diagnosis not present

## 2019-05-06 DIAGNOSIS — N2581 Secondary hyperparathyroidism of renal origin: Secondary | ICD-10-CM | POA: Diagnosis not present

## 2019-05-06 DIAGNOSIS — D631 Anemia in chronic kidney disease: Secondary | ICD-10-CM | POA: Diagnosis not present

## 2019-05-07 DIAGNOSIS — N186 End stage renal disease: Secondary | ICD-10-CM | POA: Diagnosis not present

## 2019-05-07 DIAGNOSIS — N2581 Secondary hyperparathyroidism of renal origin: Secondary | ICD-10-CM | POA: Diagnosis not present

## 2019-05-07 DIAGNOSIS — D631 Anemia in chronic kidney disease: Secondary | ICD-10-CM | POA: Diagnosis not present

## 2019-05-08 DIAGNOSIS — N2581 Secondary hyperparathyroidism of renal origin: Secondary | ICD-10-CM | POA: Diagnosis not present

## 2019-05-08 DIAGNOSIS — N186 End stage renal disease: Secondary | ICD-10-CM | POA: Diagnosis not present

## 2019-05-08 DIAGNOSIS — D631 Anemia in chronic kidney disease: Secondary | ICD-10-CM | POA: Diagnosis not present

## 2019-05-09 DIAGNOSIS — N186 End stage renal disease: Secondary | ICD-10-CM | POA: Diagnosis not present

## 2019-05-09 DIAGNOSIS — D631 Anemia in chronic kidney disease: Secondary | ICD-10-CM | POA: Diagnosis not present

## 2019-05-09 DIAGNOSIS — N2581 Secondary hyperparathyroidism of renal origin: Secondary | ICD-10-CM | POA: Diagnosis not present

## 2019-05-10 DIAGNOSIS — R9430 Abnormal result of cardiovascular function study, unspecified: Secondary | ICD-10-CM | POA: Diagnosis not present

## 2019-05-10 DIAGNOSIS — N2581 Secondary hyperparathyroidism of renal origin: Secondary | ICD-10-CM | POA: Diagnosis not present

## 2019-05-10 DIAGNOSIS — D631 Anemia in chronic kidney disease: Secondary | ICD-10-CM | POA: Diagnosis not present

## 2019-05-10 DIAGNOSIS — N186 End stage renal disease: Secondary | ICD-10-CM | POA: Diagnosis not present

## 2019-05-10 DIAGNOSIS — I517 Cardiomegaly: Secondary | ICD-10-CM | POA: Diagnosis not present

## 2019-05-10 DIAGNOSIS — I5020 Unspecified systolic (congestive) heart failure: Secondary | ICD-10-CM | POA: Diagnosis not present

## 2019-05-11 DIAGNOSIS — D631 Anemia in chronic kidney disease: Secondary | ICD-10-CM | POA: Diagnosis not present

## 2019-05-11 DIAGNOSIS — N2581 Secondary hyperparathyroidism of renal origin: Secondary | ICD-10-CM | POA: Diagnosis not present

## 2019-05-11 DIAGNOSIS — N186 End stage renal disease: Secondary | ICD-10-CM | POA: Diagnosis not present

## 2019-05-12 DIAGNOSIS — N2581 Secondary hyperparathyroidism of renal origin: Secondary | ICD-10-CM | POA: Diagnosis not present

## 2019-05-12 DIAGNOSIS — N186 End stage renal disease: Secondary | ICD-10-CM | POA: Diagnosis not present

## 2019-05-12 DIAGNOSIS — D631 Anemia in chronic kidney disease: Secondary | ICD-10-CM | POA: Diagnosis not present

## 2019-05-13 DIAGNOSIS — D631 Anemia in chronic kidney disease: Secondary | ICD-10-CM | POA: Diagnosis not present

## 2019-05-13 DIAGNOSIS — N186 End stage renal disease: Secondary | ICD-10-CM | POA: Diagnosis not present

## 2019-05-13 DIAGNOSIS — N2581 Secondary hyperparathyroidism of renal origin: Secondary | ICD-10-CM | POA: Diagnosis not present

## 2019-05-14 DIAGNOSIS — N186 End stage renal disease: Secondary | ICD-10-CM | POA: Diagnosis not present

## 2019-05-14 DIAGNOSIS — N2581 Secondary hyperparathyroidism of renal origin: Secondary | ICD-10-CM | POA: Diagnosis not present

## 2019-05-14 DIAGNOSIS — D631 Anemia in chronic kidney disease: Secondary | ICD-10-CM | POA: Diagnosis not present

## 2019-05-15 DIAGNOSIS — N186 End stage renal disease: Secondary | ICD-10-CM | POA: Diagnosis not present

## 2019-05-15 DIAGNOSIS — D631 Anemia in chronic kidney disease: Secondary | ICD-10-CM | POA: Diagnosis not present

## 2019-05-15 DIAGNOSIS — N2581 Secondary hyperparathyroidism of renal origin: Secondary | ICD-10-CM | POA: Diagnosis not present

## 2019-05-16 DIAGNOSIS — N2581 Secondary hyperparathyroidism of renal origin: Secondary | ICD-10-CM | POA: Diagnosis not present

## 2019-05-16 DIAGNOSIS — N186 End stage renal disease: Secondary | ICD-10-CM | POA: Diagnosis not present

## 2019-05-16 DIAGNOSIS — D631 Anemia in chronic kidney disease: Secondary | ICD-10-CM | POA: Diagnosis not present

## 2019-05-17 DIAGNOSIS — N2581 Secondary hyperparathyroidism of renal origin: Secondary | ICD-10-CM | POA: Diagnosis not present

## 2019-05-17 DIAGNOSIS — D631 Anemia in chronic kidney disease: Secondary | ICD-10-CM | POA: Diagnosis not present

## 2019-05-17 DIAGNOSIS — N186 End stage renal disease: Secondary | ICD-10-CM | POA: Diagnosis not present

## 2019-05-18 DIAGNOSIS — N2581 Secondary hyperparathyroidism of renal origin: Secondary | ICD-10-CM | POA: Diagnosis not present

## 2019-05-18 DIAGNOSIS — D631 Anemia in chronic kidney disease: Secondary | ICD-10-CM | POA: Diagnosis not present

## 2019-05-18 DIAGNOSIS — N186 End stage renal disease: Secondary | ICD-10-CM | POA: Diagnosis not present

## 2019-05-19 DIAGNOSIS — N186 End stage renal disease: Secondary | ICD-10-CM | POA: Diagnosis not present

## 2019-05-19 DIAGNOSIS — N2581 Secondary hyperparathyroidism of renal origin: Secondary | ICD-10-CM | POA: Diagnosis not present

## 2019-05-19 DIAGNOSIS — D631 Anemia in chronic kidney disease: Secondary | ICD-10-CM | POA: Diagnosis not present

## 2019-05-20 DIAGNOSIS — N2581 Secondary hyperparathyroidism of renal origin: Secondary | ICD-10-CM | POA: Diagnosis not present

## 2019-05-20 DIAGNOSIS — N186 End stage renal disease: Secondary | ICD-10-CM | POA: Diagnosis not present

## 2019-05-20 DIAGNOSIS — D631 Anemia in chronic kidney disease: Secondary | ICD-10-CM | POA: Diagnosis not present

## 2019-05-21 DIAGNOSIS — N2581 Secondary hyperparathyroidism of renal origin: Secondary | ICD-10-CM | POA: Diagnosis not present

## 2019-05-21 DIAGNOSIS — D631 Anemia in chronic kidney disease: Secondary | ICD-10-CM | POA: Diagnosis not present

## 2019-05-21 DIAGNOSIS — N186 End stage renal disease: Secondary | ICD-10-CM | POA: Diagnosis not present

## 2019-05-22 DIAGNOSIS — N186 End stage renal disease: Secondary | ICD-10-CM | POA: Diagnosis not present

## 2019-05-22 DIAGNOSIS — N2581 Secondary hyperparathyroidism of renal origin: Secondary | ICD-10-CM | POA: Diagnosis not present

## 2019-05-22 DIAGNOSIS — D631 Anemia in chronic kidney disease: Secondary | ICD-10-CM | POA: Diagnosis not present

## 2019-05-23 DIAGNOSIS — D631 Anemia in chronic kidney disease: Secondary | ICD-10-CM | POA: Diagnosis not present

## 2019-05-23 DIAGNOSIS — N2581 Secondary hyperparathyroidism of renal origin: Secondary | ICD-10-CM | POA: Diagnosis not present

## 2019-05-23 DIAGNOSIS — N186 End stage renal disease: Secondary | ICD-10-CM | POA: Diagnosis not present

## 2019-05-24 DIAGNOSIS — N2581 Secondary hyperparathyroidism of renal origin: Secondary | ICD-10-CM | POA: Diagnosis not present

## 2019-05-24 DIAGNOSIS — N186 End stage renal disease: Secondary | ICD-10-CM | POA: Diagnosis not present

## 2019-05-24 DIAGNOSIS — D631 Anemia in chronic kidney disease: Secondary | ICD-10-CM | POA: Diagnosis not present

## 2019-05-25 DIAGNOSIS — N186 End stage renal disease: Secondary | ICD-10-CM | POA: Diagnosis not present

## 2019-05-25 DIAGNOSIS — D631 Anemia in chronic kidney disease: Secondary | ICD-10-CM | POA: Diagnosis not present

## 2019-05-25 DIAGNOSIS — N2581 Secondary hyperparathyroidism of renal origin: Secondary | ICD-10-CM | POA: Diagnosis not present

## 2019-05-26 DIAGNOSIS — N2581 Secondary hyperparathyroidism of renal origin: Secondary | ICD-10-CM | POA: Diagnosis not present

## 2019-05-26 DIAGNOSIS — D631 Anemia in chronic kidney disease: Secondary | ICD-10-CM | POA: Diagnosis not present

## 2019-05-26 DIAGNOSIS — N186 End stage renal disease: Secondary | ICD-10-CM | POA: Diagnosis not present

## 2019-05-27 DIAGNOSIS — D631 Anemia in chronic kidney disease: Secondary | ICD-10-CM | POA: Diagnosis not present

## 2019-05-27 DIAGNOSIS — N2581 Secondary hyperparathyroidism of renal origin: Secondary | ICD-10-CM | POA: Diagnosis not present

## 2019-05-27 DIAGNOSIS — N186 End stage renal disease: Secondary | ICD-10-CM | POA: Diagnosis not present

## 2019-05-28 DIAGNOSIS — D631 Anemia in chronic kidney disease: Secondary | ICD-10-CM | POA: Diagnosis not present

## 2019-05-28 DIAGNOSIS — N186 End stage renal disease: Secondary | ICD-10-CM | POA: Diagnosis not present

## 2019-05-28 DIAGNOSIS — N2581 Secondary hyperparathyroidism of renal origin: Secondary | ICD-10-CM | POA: Diagnosis not present

## 2019-05-29 DIAGNOSIS — D631 Anemia in chronic kidney disease: Secondary | ICD-10-CM | POA: Diagnosis not present

## 2019-05-29 DIAGNOSIS — N186 End stage renal disease: Secondary | ICD-10-CM | POA: Diagnosis not present

## 2019-05-29 DIAGNOSIS — N2581 Secondary hyperparathyroidism of renal origin: Secondary | ICD-10-CM | POA: Diagnosis not present

## 2019-05-30 DIAGNOSIS — D631 Anemia in chronic kidney disease: Secondary | ICD-10-CM | POA: Diagnosis not present

## 2019-05-30 DIAGNOSIS — Z992 Dependence on renal dialysis: Secondary | ICD-10-CM | POA: Diagnosis not present

## 2019-05-30 DIAGNOSIS — N186 End stage renal disease: Secondary | ICD-10-CM | POA: Diagnosis not present

## 2019-05-30 DIAGNOSIS — N2581 Secondary hyperparathyroidism of renal origin: Secondary | ICD-10-CM | POA: Diagnosis not present

## 2019-05-31 DIAGNOSIS — E119 Type 2 diabetes mellitus without complications: Secondary | ICD-10-CM | POA: Diagnosis not present

## 2019-05-31 DIAGNOSIS — D631 Anemia in chronic kidney disease: Secondary | ICD-10-CM | POA: Diagnosis not present

## 2019-05-31 DIAGNOSIS — Z114 Encounter for screening for human immunodeficiency virus [HIV]: Secondary | ICD-10-CM | POA: Diagnosis not present

## 2019-05-31 DIAGNOSIS — N186 End stage renal disease: Secondary | ICD-10-CM | POA: Diagnosis not present

## 2019-05-31 DIAGNOSIS — N2581 Secondary hyperparathyroidism of renal origin: Secondary | ICD-10-CM | POA: Diagnosis not present

## 2019-05-31 DIAGNOSIS — Z4932 Encounter for adequacy testing for peritoneal dialysis: Secondary | ICD-10-CM | POA: Diagnosis not present

## 2019-06-01 DIAGNOSIS — N2581 Secondary hyperparathyroidism of renal origin: Secondary | ICD-10-CM | POA: Diagnosis not present

## 2019-06-01 DIAGNOSIS — D631 Anemia in chronic kidney disease: Secondary | ICD-10-CM | POA: Diagnosis not present

## 2019-06-01 DIAGNOSIS — N186 End stage renal disease: Secondary | ICD-10-CM | POA: Diagnosis not present

## 2019-06-02 DIAGNOSIS — N186 End stage renal disease: Secondary | ICD-10-CM | POA: Diagnosis not present

## 2019-06-02 DIAGNOSIS — D631 Anemia in chronic kidney disease: Secondary | ICD-10-CM | POA: Diagnosis not present

## 2019-06-02 DIAGNOSIS — N2581 Secondary hyperparathyroidism of renal origin: Secondary | ICD-10-CM | POA: Diagnosis not present

## 2019-06-03 DIAGNOSIS — N2581 Secondary hyperparathyroidism of renal origin: Secondary | ICD-10-CM | POA: Diagnosis not present

## 2019-06-03 DIAGNOSIS — D631 Anemia in chronic kidney disease: Secondary | ICD-10-CM | POA: Diagnosis not present

## 2019-06-03 DIAGNOSIS — N186 End stage renal disease: Secondary | ICD-10-CM | POA: Diagnosis not present

## 2019-06-04 DIAGNOSIS — N186 End stage renal disease: Secondary | ICD-10-CM | POA: Diagnosis not present

## 2019-06-04 DIAGNOSIS — N2581 Secondary hyperparathyroidism of renal origin: Secondary | ICD-10-CM | POA: Diagnosis not present

## 2019-06-04 DIAGNOSIS — D631 Anemia in chronic kidney disease: Secondary | ICD-10-CM | POA: Diagnosis not present

## 2019-06-05 DIAGNOSIS — N186 End stage renal disease: Secondary | ICD-10-CM | POA: Diagnosis not present

## 2019-06-05 DIAGNOSIS — D631 Anemia in chronic kidney disease: Secondary | ICD-10-CM | POA: Diagnosis not present

## 2019-06-05 DIAGNOSIS — N2581 Secondary hyperparathyroidism of renal origin: Secondary | ICD-10-CM | POA: Diagnosis not present

## 2019-06-06 DIAGNOSIS — N186 End stage renal disease: Secondary | ICD-10-CM | POA: Diagnosis not present

## 2019-06-06 DIAGNOSIS — D631 Anemia in chronic kidney disease: Secondary | ICD-10-CM | POA: Diagnosis not present

## 2019-06-06 DIAGNOSIS — N2581 Secondary hyperparathyroidism of renal origin: Secondary | ICD-10-CM | POA: Diagnosis not present

## 2019-06-07 DIAGNOSIS — N186 End stage renal disease: Secondary | ICD-10-CM | POA: Diagnosis not present

## 2019-06-07 DIAGNOSIS — D631 Anemia in chronic kidney disease: Secondary | ICD-10-CM | POA: Diagnosis not present

## 2019-06-07 DIAGNOSIS — N2581 Secondary hyperparathyroidism of renal origin: Secondary | ICD-10-CM | POA: Diagnosis not present

## 2019-06-09 DIAGNOSIS — D631 Anemia in chronic kidney disease: Secondary | ICD-10-CM | POA: Diagnosis not present

## 2019-06-09 DIAGNOSIS — N2581 Secondary hyperparathyroidism of renal origin: Secondary | ICD-10-CM | POA: Diagnosis not present

## 2019-06-09 DIAGNOSIS — N186 End stage renal disease: Secondary | ICD-10-CM | POA: Diagnosis not present

## 2019-06-10 DIAGNOSIS — D631 Anemia in chronic kidney disease: Secondary | ICD-10-CM | POA: Diagnosis not present

## 2019-06-10 DIAGNOSIS — N186 End stage renal disease: Secondary | ICD-10-CM | POA: Diagnosis not present

## 2019-06-10 DIAGNOSIS — N2581 Secondary hyperparathyroidism of renal origin: Secondary | ICD-10-CM | POA: Diagnosis not present

## 2019-06-11 DIAGNOSIS — N2581 Secondary hyperparathyroidism of renal origin: Secondary | ICD-10-CM | POA: Diagnosis not present

## 2019-06-11 DIAGNOSIS — N186 End stage renal disease: Secondary | ICD-10-CM | POA: Diagnosis not present

## 2019-06-11 DIAGNOSIS — D631 Anemia in chronic kidney disease: Secondary | ICD-10-CM | POA: Diagnosis not present

## 2019-06-12 DIAGNOSIS — D631 Anemia in chronic kidney disease: Secondary | ICD-10-CM | POA: Diagnosis not present

## 2019-06-12 DIAGNOSIS — N2581 Secondary hyperparathyroidism of renal origin: Secondary | ICD-10-CM | POA: Diagnosis not present

## 2019-06-12 DIAGNOSIS — N186 End stage renal disease: Secondary | ICD-10-CM | POA: Diagnosis not present

## 2019-06-14 DIAGNOSIS — D631 Anemia in chronic kidney disease: Secondary | ICD-10-CM | POA: Diagnosis not present

## 2019-06-14 DIAGNOSIS — N186 End stage renal disease: Secondary | ICD-10-CM | POA: Diagnosis not present

## 2019-06-14 DIAGNOSIS — N2581 Secondary hyperparathyroidism of renal origin: Secondary | ICD-10-CM | POA: Diagnosis not present

## 2019-06-15 DIAGNOSIS — N186 End stage renal disease: Secondary | ICD-10-CM | POA: Diagnosis not present

## 2019-06-15 DIAGNOSIS — N2581 Secondary hyperparathyroidism of renal origin: Secondary | ICD-10-CM | POA: Diagnosis not present

## 2019-06-15 DIAGNOSIS — D631 Anemia in chronic kidney disease: Secondary | ICD-10-CM | POA: Diagnosis not present

## 2019-06-16 DIAGNOSIS — N2581 Secondary hyperparathyroidism of renal origin: Secondary | ICD-10-CM | POA: Diagnosis not present

## 2019-06-16 DIAGNOSIS — D631 Anemia in chronic kidney disease: Secondary | ICD-10-CM | POA: Diagnosis not present

## 2019-06-16 DIAGNOSIS — N186 End stage renal disease: Secondary | ICD-10-CM | POA: Diagnosis not present

## 2019-06-18 DIAGNOSIS — N186 End stage renal disease: Secondary | ICD-10-CM | POA: Diagnosis not present

## 2019-06-18 DIAGNOSIS — N2581 Secondary hyperparathyroidism of renal origin: Secondary | ICD-10-CM | POA: Diagnosis not present

## 2019-06-18 DIAGNOSIS — D631 Anemia in chronic kidney disease: Secondary | ICD-10-CM | POA: Diagnosis not present

## 2019-06-20 DIAGNOSIS — D631 Anemia in chronic kidney disease: Secondary | ICD-10-CM | POA: Diagnosis not present

## 2019-06-20 DIAGNOSIS — N2581 Secondary hyperparathyroidism of renal origin: Secondary | ICD-10-CM | POA: Diagnosis not present

## 2019-06-20 DIAGNOSIS — N186 End stage renal disease: Secondary | ICD-10-CM | POA: Diagnosis not present

## 2019-06-22 DIAGNOSIS — N186 End stage renal disease: Secondary | ICD-10-CM | POA: Diagnosis not present

## 2019-06-22 DIAGNOSIS — N2581 Secondary hyperparathyroidism of renal origin: Secondary | ICD-10-CM | POA: Diagnosis not present

## 2019-06-22 DIAGNOSIS — D631 Anemia in chronic kidney disease: Secondary | ICD-10-CM | POA: Diagnosis not present

## 2019-06-23 DIAGNOSIS — N2581 Secondary hyperparathyroidism of renal origin: Secondary | ICD-10-CM | POA: Diagnosis not present

## 2019-06-23 DIAGNOSIS — D631 Anemia in chronic kidney disease: Secondary | ICD-10-CM | POA: Diagnosis not present

## 2019-06-23 DIAGNOSIS — N186 End stage renal disease: Secondary | ICD-10-CM | POA: Diagnosis not present

## 2019-06-25 DIAGNOSIS — N186 End stage renal disease: Secondary | ICD-10-CM | POA: Diagnosis not present

## 2019-06-25 DIAGNOSIS — N2581 Secondary hyperparathyroidism of renal origin: Secondary | ICD-10-CM | POA: Diagnosis not present

## 2019-06-25 DIAGNOSIS — D631 Anemia in chronic kidney disease: Secondary | ICD-10-CM | POA: Diagnosis not present

## 2019-06-26 DIAGNOSIS — D631 Anemia in chronic kidney disease: Secondary | ICD-10-CM | POA: Diagnosis not present

## 2019-06-26 DIAGNOSIS — N2581 Secondary hyperparathyroidism of renal origin: Secondary | ICD-10-CM | POA: Diagnosis not present

## 2019-06-26 DIAGNOSIS — N186 End stage renal disease: Secondary | ICD-10-CM | POA: Diagnosis not present

## 2019-06-27 DIAGNOSIS — N186 End stage renal disease: Secondary | ICD-10-CM | POA: Diagnosis not present

## 2019-06-27 DIAGNOSIS — N2581 Secondary hyperparathyroidism of renal origin: Secondary | ICD-10-CM | POA: Diagnosis not present

## 2019-06-27 DIAGNOSIS — D631 Anemia in chronic kidney disease: Secondary | ICD-10-CM | POA: Diagnosis not present

## 2019-06-28 DIAGNOSIS — N2581 Secondary hyperparathyroidism of renal origin: Secondary | ICD-10-CM | POA: Diagnosis not present

## 2019-06-28 DIAGNOSIS — N186 End stage renal disease: Secondary | ICD-10-CM | POA: Diagnosis not present

## 2019-06-28 DIAGNOSIS — D631 Anemia in chronic kidney disease: Secondary | ICD-10-CM | POA: Diagnosis not present

## 2019-06-29 DIAGNOSIS — Z992 Dependence on renal dialysis: Secondary | ICD-10-CM | POA: Diagnosis not present

## 2019-06-29 DIAGNOSIS — N2581 Secondary hyperparathyroidism of renal origin: Secondary | ICD-10-CM | POA: Diagnosis not present

## 2019-06-29 DIAGNOSIS — N186 End stage renal disease: Secondary | ICD-10-CM | POA: Diagnosis not present

## 2019-06-29 DIAGNOSIS — D631 Anemia in chronic kidney disease: Secondary | ICD-10-CM | POA: Diagnosis not present

## 2019-07-05 DIAGNOSIS — E119 Type 2 diabetes mellitus without complications: Secondary | ICD-10-CM | POA: Diagnosis not present

## 2019-07-20 DIAGNOSIS — N185 Chronic kidney disease, stage 5: Secondary | ICD-10-CM | POA: Diagnosis not present

## 2019-07-20 DIAGNOSIS — L0231 Cutaneous abscess of buttock: Secondary | ICD-10-CM | POA: Diagnosis not present

## 2019-07-20 DIAGNOSIS — L02214 Cutaneous abscess of groin: Secondary | ICD-10-CM | POA: Diagnosis not present

## 2019-07-20 DIAGNOSIS — L732 Hidradenitis suppurativa: Secondary | ICD-10-CM | POA: Diagnosis not present

## 2019-07-30 DIAGNOSIS — N186 End stage renal disease: Secondary | ICD-10-CM | POA: Diagnosis not present

## 2019-07-30 DIAGNOSIS — Z992 Dependence on renal dialysis: Secondary | ICD-10-CM | POA: Diagnosis not present

## 2019-08-30 DIAGNOSIS — N186 End stage renal disease: Secondary | ICD-10-CM | POA: Diagnosis not present

## 2019-08-30 DIAGNOSIS — D631 Anemia in chronic kidney disease: Secondary | ICD-10-CM | POA: Diagnosis not present

## 2019-08-30 DIAGNOSIS — E119 Type 2 diabetes mellitus without complications: Secondary | ICD-10-CM | POA: Diagnosis not present

## 2019-08-30 DIAGNOSIS — N2581 Secondary hyperparathyroidism of renal origin: Secondary | ICD-10-CM | POA: Diagnosis not present

## 2019-08-30 DIAGNOSIS — Z4932 Encounter for adequacy testing for peritoneal dialysis: Secondary | ICD-10-CM | POA: Diagnosis not present

## 2019-08-31 DIAGNOSIS — N186 End stage renal disease: Secondary | ICD-10-CM | POA: Diagnosis not present

## 2019-08-31 DIAGNOSIS — N2581 Secondary hyperparathyroidism of renal origin: Secondary | ICD-10-CM | POA: Diagnosis not present

## 2019-08-31 DIAGNOSIS — D631 Anemia in chronic kidney disease: Secondary | ICD-10-CM | POA: Diagnosis not present

## 2019-09-01 DIAGNOSIS — N2581 Secondary hyperparathyroidism of renal origin: Secondary | ICD-10-CM | POA: Diagnosis not present

## 2019-09-01 DIAGNOSIS — D631 Anemia in chronic kidney disease: Secondary | ICD-10-CM | POA: Diagnosis not present

## 2019-09-01 DIAGNOSIS — N186 End stage renal disease: Secondary | ICD-10-CM | POA: Diagnosis not present

## 2019-09-02 DIAGNOSIS — D631 Anemia in chronic kidney disease: Secondary | ICD-10-CM | POA: Diagnosis not present

## 2019-09-02 DIAGNOSIS — N2581 Secondary hyperparathyroidism of renal origin: Secondary | ICD-10-CM | POA: Diagnosis not present

## 2019-09-02 DIAGNOSIS — N186 End stage renal disease: Secondary | ICD-10-CM | POA: Diagnosis not present

## 2019-09-03 DIAGNOSIS — N2581 Secondary hyperparathyroidism of renal origin: Secondary | ICD-10-CM | POA: Diagnosis not present

## 2019-09-03 DIAGNOSIS — D631 Anemia in chronic kidney disease: Secondary | ICD-10-CM | POA: Diagnosis not present

## 2019-09-03 DIAGNOSIS — N186 End stage renal disease: Secondary | ICD-10-CM | POA: Diagnosis not present

## 2019-09-05 DIAGNOSIS — D631 Anemia in chronic kidney disease: Secondary | ICD-10-CM | POA: Diagnosis not present

## 2019-09-05 DIAGNOSIS — N2581 Secondary hyperparathyroidism of renal origin: Secondary | ICD-10-CM | POA: Diagnosis not present

## 2019-09-05 DIAGNOSIS — N186 End stage renal disease: Secondary | ICD-10-CM | POA: Diagnosis not present

## 2019-09-06 DIAGNOSIS — N2581 Secondary hyperparathyroidism of renal origin: Secondary | ICD-10-CM | POA: Diagnosis not present

## 2019-09-06 DIAGNOSIS — D631 Anemia in chronic kidney disease: Secondary | ICD-10-CM | POA: Diagnosis not present

## 2019-09-06 DIAGNOSIS — N186 End stage renal disease: Secondary | ICD-10-CM | POA: Diagnosis not present

## 2019-09-07 DIAGNOSIS — D631 Anemia in chronic kidney disease: Secondary | ICD-10-CM | POA: Diagnosis not present

## 2019-09-07 DIAGNOSIS — N186 End stage renal disease: Secondary | ICD-10-CM | POA: Diagnosis not present

## 2019-09-07 DIAGNOSIS — N2581 Secondary hyperparathyroidism of renal origin: Secondary | ICD-10-CM | POA: Diagnosis not present

## 2019-09-09 DIAGNOSIS — N2581 Secondary hyperparathyroidism of renal origin: Secondary | ICD-10-CM | POA: Diagnosis not present

## 2019-09-09 DIAGNOSIS — D631 Anemia in chronic kidney disease: Secondary | ICD-10-CM | POA: Diagnosis not present

## 2019-09-09 DIAGNOSIS — N186 End stage renal disease: Secondary | ICD-10-CM | POA: Diagnosis not present

## 2019-09-10 DIAGNOSIS — N186 End stage renal disease: Secondary | ICD-10-CM | POA: Diagnosis not present

## 2019-09-10 DIAGNOSIS — D631 Anemia in chronic kidney disease: Secondary | ICD-10-CM | POA: Diagnosis not present

## 2019-09-10 DIAGNOSIS — N2581 Secondary hyperparathyroidism of renal origin: Secondary | ICD-10-CM | POA: Diagnosis not present

## 2019-09-11 DIAGNOSIS — N186 End stage renal disease: Secondary | ICD-10-CM | POA: Diagnosis not present

## 2019-09-11 DIAGNOSIS — N2581 Secondary hyperparathyroidism of renal origin: Secondary | ICD-10-CM | POA: Diagnosis not present

## 2019-09-11 DIAGNOSIS — D631 Anemia in chronic kidney disease: Secondary | ICD-10-CM | POA: Diagnosis not present

## 2019-09-12 DIAGNOSIS — D631 Anemia in chronic kidney disease: Secondary | ICD-10-CM | POA: Diagnosis not present

## 2019-09-12 DIAGNOSIS — N2581 Secondary hyperparathyroidism of renal origin: Secondary | ICD-10-CM | POA: Diagnosis not present

## 2019-09-12 DIAGNOSIS — N186 End stage renal disease: Secondary | ICD-10-CM | POA: Diagnosis not present

## 2019-09-13 DIAGNOSIS — N2581 Secondary hyperparathyroidism of renal origin: Secondary | ICD-10-CM | POA: Diagnosis not present

## 2019-09-13 DIAGNOSIS — D631 Anemia in chronic kidney disease: Secondary | ICD-10-CM | POA: Diagnosis not present

## 2019-09-13 DIAGNOSIS — N186 End stage renal disease: Secondary | ICD-10-CM | POA: Diagnosis not present

## 2019-09-14 DIAGNOSIS — D631 Anemia in chronic kidney disease: Secondary | ICD-10-CM | POA: Diagnosis not present

## 2019-09-14 DIAGNOSIS — N2581 Secondary hyperparathyroidism of renal origin: Secondary | ICD-10-CM | POA: Diagnosis not present

## 2019-09-14 DIAGNOSIS — N186 End stage renal disease: Secondary | ICD-10-CM | POA: Diagnosis not present

## 2019-09-15 DIAGNOSIS — N2581 Secondary hyperparathyroidism of renal origin: Secondary | ICD-10-CM | POA: Diagnosis not present

## 2019-09-15 DIAGNOSIS — D631 Anemia in chronic kidney disease: Secondary | ICD-10-CM | POA: Diagnosis not present

## 2019-09-15 DIAGNOSIS — N186 End stage renal disease: Secondary | ICD-10-CM | POA: Diagnosis not present

## 2019-09-16 DIAGNOSIS — D631 Anemia in chronic kidney disease: Secondary | ICD-10-CM | POA: Diagnosis not present

## 2019-09-16 DIAGNOSIS — N2581 Secondary hyperparathyroidism of renal origin: Secondary | ICD-10-CM | POA: Diagnosis not present

## 2019-09-16 DIAGNOSIS — N186 End stage renal disease: Secondary | ICD-10-CM | POA: Diagnosis not present

## 2019-09-17 DIAGNOSIS — D631 Anemia in chronic kidney disease: Secondary | ICD-10-CM | POA: Diagnosis not present

## 2019-09-17 DIAGNOSIS — N2581 Secondary hyperparathyroidism of renal origin: Secondary | ICD-10-CM | POA: Diagnosis not present

## 2019-09-17 DIAGNOSIS — N186 End stage renal disease: Secondary | ICD-10-CM | POA: Diagnosis not present

## 2019-09-18 DIAGNOSIS — N2581 Secondary hyperparathyroidism of renal origin: Secondary | ICD-10-CM | POA: Diagnosis not present

## 2019-09-18 DIAGNOSIS — N186 End stage renal disease: Secondary | ICD-10-CM | POA: Diagnosis not present

## 2019-09-18 DIAGNOSIS — D631 Anemia in chronic kidney disease: Secondary | ICD-10-CM | POA: Diagnosis not present

## 2019-09-19 DIAGNOSIS — N2581 Secondary hyperparathyroidism of renal origin: Secondary | ICD-10-CM | POA: Diagnosis not present

## 2019-09-19 DIAGNOSIS — N186 End stage renal disease: Secondary | ICD-10-CM | POA: Diagnosis not present

## 2019-09-19 DIAGNOSIS — D631 Anemia in chronic kidney disease: Secondary | ICD-10-CM | POA: Diagnosis not present

## 2019-09-20 DIAGNOSIS — N186 End stage renal disease: Secondary | ICD-10-CM | POA: Diagnosis not present

## 2019-09-20 DIAGNOSIS — N2581 Secondary hyperparathyroidism of renal origin: Secondary | ICD-10-CM | POA: Diagnosis not present

## 2019-09-20 DIAGNOSIS — D631 Anemia in chronic kidney disease: Secondary | ICD-10-CM | POA: Diagnosis not present

## 2019-09-22 DIAGNOSIS — N186 End stage renal disease: Secondary | ICD-10-CM | POA: Diagnosis not present

## 2019-09-22 DIAGNOSIS — D631 Anemia in chronic kidney disease: Secondary | ICD-10-CM | POA: Diagnosis not present

## 2019-09-22 DIAGNOSIS — N2581 Secondary hyperparathyroidism of renal origin: Secondary | ICD-10-CM | POA: Diagnosis not present

## 2019-09-23 DIAGNOSIS — N2581 Secondary hyperparathyroidism of renal origin: Secondary | ICD-10-CM | POA: Diagnosis not present

## 2019-09-23 DIAGNOSIS — D631 Anemia in chronic kidney disease: Secondary | ICD-10-CM | POA: Diagnosis not present

## 2019-09-23 DIAGNOSIS — N186 End stage renal disease: Secondary | ICD-10-CM | POA: Diagnosis not present

## 2019-09-24 DIAGNOSIS — D631 Anemia in chronic kidney disease: Secondary | ICD-10-CM | POA: Diagnosis not present

## 2019-09-24 DIAGNOSIS — N186 End stage renal disease: Secondary | ICD-10-CM | POA: Diagnosis not present

## 2019-09-24 DIAGNOSIS — N2581 Secondary hyperparathyroidism of renal origin: Secondary | ICD-10-CM | POA: Diagnosis not present

## 2019-09-25 DIAGNOSIS — D631 Anemia in chronic kidney disease: Secondary | ICD-10-CM | POA: Diagnosis not present

## 2019-09-25 DIAGNOSIS — N186 End stage renal disease: Secondary | ICD-10-CM | POA: Diagnosis not present

## 2019-09-25 DIAGNOSIS — N2581 Secondary hyperparathyroidism of renal origin: Secondary | ICD-10-CM | POA: Diagnosis not present

## 2019-09-29 DIAGNOSIS — Z992 Dependence on renal dialysis: Secondary | ICD-10-CM | POA: Diagnosis not present

## 2019-09-29 DIAGNOSIS — N186 End stage renal disease: Secondary | ICD-10-CM | POA: Diagnosis not present

## 2019-10-03 DIAGNOSIS — D631 Anemia in chronic kidney disease: Secondary | ICD-10-CM | POA: Diagnosis not present

## 2019-10-03 DIAGNOSIS — E119 Type 2 diabetes mellitus without complications: Secondary | ICD-10-CM | POA: Diagnosis not present

## 2019-10-03 DIAGNOSIS — N2581 Secondary hyperparathyroidism of renal origin: Secondary | ICD-10-CM | POA: Diagnosis not present

## 2019-10-03 DIAGNOSIS — N186 End stage renal disease: Secondary | ICD-10-CM | POA: Diagnosis not present

## 2019-10-05 DIAGNOSIS — D631 Anemia in chronic kidney disease: Secondary | ICD-10-CM | POA: Diagnosis not present

## 2019-10-05 DIAGNOSIS — N186 End stage renal disease: Secondary | ICD-10-CM | POA: Diagnosis not present

## 2019-10-05 DIAGNOSIS — N2581 Secondary hyperparathyroidism of renal origin: Secondary | ICD-10-CM | POA: Diagnosis not present

## 2019-10-06 DIAGNOSIS — D631 Anemia in chronic kidney disease: Secondary | ICD-10-CM | POA: Diagnosis not present

## 2019-10-06 DIAGNOSIS — N2581 Secondary hyperparathyroidism of renal origin: Secondary | ICD-10-CM | POA: Diagnosis not present

## 2019-10-06 DIAGNOSIS — N186 End stage renal disease: Secondary | ICD-10-CM | POA: Diagnosis not present

## 2019-10-07 DIAGNOSIS — N186 End stage renal disease: Secondary | ICD-10-CM | POA: Diagnosis not present

## 2019-10-07 DIAGNOSIS — N2581 Secondary hyperparathyroidism of renal origin: Secondary | ICD-10-CM | POA: Diagnosis not present

## 2019-10-07 DIAGNOSIS — D631 Anemia in chronic kidney disease: Secondary | ICD-10-CM | POA: Diagnosis not present

## 2019-10-08 DIAGNOSIS — N2581 Secondary hyperparathyroidism of renal origin: Secondary | ICD-10-CM | POA: Diagnosis not present

## 2019-10-08 DIAGNOSIS — D631 Anemia in chronic kidney disease: Secondary | ICD-10-CM | POA: Diagnosis not present

## 2019-10-08 DIAGNOSIS — N186 End stage renal disease: Secondary | ICD-10-CM | POA: Diagnosis not present

## 2019-10-09 DIAGNOSIS — N186 End stage renal disease: Secondary | ICD-10-CM | POA: Diagnosis not present

## 2019-10-09 DIAGNOSIS — N2581 Secondary hyperparathyroidism of renal origin: Secondary | ICD-10-CM | POA: Diagnosis not present

## 2019-10-09 DIAGNOSIS — D631 Anemia in chronic kidney disease: Secondary | ICD-10-CM | POA: Diagnosis not present

## 2019-10-10 DIAGNOSIS — D631 Anemia in chronic kidney disease: Secondary | ICD-10-CM | POA: Diagnosis not present

## 2019-10-10 DIAGNOSIS — N186 End stage renal disease: Secondary | ICD-10-CM | POA: Diagnosis not present

## 2019-10-10 DIAGNOSIS — N2581 Secondary hyperparathyroidism of renal origin: Secondary | ICD-10-CM | POA: Diagnosis not present

## 2019-10-11 DIAGNOSIS — N2581 Secondary hyperparathyroidism of renal origin: Secondary | ICD-10-CM | POA: Diagnosis not present

## 2019-10-11 DIAGNOSIS — N186 End stage renal disease: Secondary | ICD-10-CM | POA: Diagnosis not present

## 2019-10-11 DIAGNOSIS — D631 Anemia in chronic kidney disease: Secondary | ICD-10-CM | POA: Diagnosis not present

## 2019-10-12 DIAGNOSIS — N2581 Secondary hyperparathyroidism of renal origin: Secondary | ICD-10-CM | POA: Diagnosis not present

## 2019-10-12 DIAGNOSIS — N186 End stage renal disease: Secondary | ICD-10-CM | POA: Diagnosis not present

## 2019-10-12 DIAGNOSIS — D631 Anemia in chronic kidney disease: Secondary | ICD-10-CM | POA: Diagnosis not present

## 2019-10-13 DIAGNOSIS — N186 End stage renal disease: Secondary | ICD-10-CM | POA: Diagnosis not present

## 2019-10-15 DIAGNOSIS — N186 End stage renal disease: Secondary | ICD-10-CM | POA: Diagnosis not present

## 2019-10-15 DIAGNOSIS — D631 Anemia in chronic kidney disease: Secondary | ICD-10-CM | POA: Diagnosis not present

## 2019-10-15 DIAGNOSIS — N2581 Secondary hyperparathyroidism of renal origin: Secondary | ICD-10-CM | POA: Diagnosis not present

## 2019-10-16 DIAGNOSIS — N186 End stage renal disease: Secondary | ICD-10-CM | POA: Diagnosis not present

## 2019-10-16 DIAGNOSIS — D631 Anemia in chronic kidney disease: Secondary | ICD-10-CM | POA: Diagnosis not present

## 2019-10-16 DIAGNOSIS — N2581 Secondary hyperparathyroidism of renal origin: Secondary | ICD-10-CM | POA: Diagnosis not present

## 2019-10-17 DIAGNOSIS — N186 End stage renal disease: Secondary | ICD-10-CM | POA: Diagnosis not present

## 2019-10-17 DIAGNOSIS — D631 Anemia in chronic kidney disease: Secondary | ICD-10-CM | POA: Diagnosis not present

## 2019-10-17 DIAGNOSIS — N2581 Secondary hyperparathyroidism of renal origin: Secondary | ICD-10-CM | POA: Diagnosis not present

## 2019-10-18 DIAGNOSIS — D631 Anemia in chronic kidney disease: Secondary | ICD-10-CM | POA: Diagnosis not present

## 2019-10-18 DIAGNOSIS — N2581 Secondary hyperparathyroidism of renal origin: Secondary | ICD-10-CM | POA: Diagnosis not present

## 2019-10-18 DIAGNOSIS — N186 End stage renal disease: Secondary | ICD-10-CM | POA: Diagnosis not present

## 2019-10-19 DIAGNOSIS — N186 End stage renal disease: Secondary | ICD-10-CM | POA: Diagnosis not present

## 2019-10-20 DIAGNOSIS — N186 End stage renal disease: Secondary | ICD-10-CM | POA: Diagnosis not present

## 2019-10-21 DIAGNOSIS — N186 End stage renal disease: Secondary | ICD-10-CM | POA: Diagnosis not present

## 2019-10-21 DIAGNOSIS — D631 Anemia in chronic kidney disease: Secondary | ICD-10-CM | POA: Diagnosis not present

## 2019-10-21 DIAGNOSIS — N2581 Secondary hyperparathyroidism of renal origin: Secondary | ICD-10-CM | POA: Diagnosis not present

## 2019-10-22 DIAGNOSIS — N186 End stage renal disease: Secondary | ICD-10-CM | POA: Diagnosis not present

## 2019-10-22 DIAGNOSIS — N2581 Secondary hyperparathyroidism of renal origin: Secondary | ICD-10-CM | POA: Diagnosis not present

## 2019-10-22 DIAGNOSIS — D631 Anemia in chronic kidney disease: Secondary | ICD-10-CM | POA: Diagnosis not present

## 2019-10-23 DIAGNOSIS — D631 Anemia in chronic kidney disease: Secondary | ICD-10-CM | POA: Diagnosis not present

## 2019-10-23 DIAGNOSIS — N186 End stage renal disease: Secondary | ICD-10-CM | POA: Diagnosis not present

## 2019-10-23 DIAGNOSIS — N2581 Secondary hyperparathyroidism of renal origin: Secondary | ICD-10-CM | POA: Diagnosis not present

## 2019-10-24 DIAGNOSIS — M79641 Pain in right hand: Secondary | ICD-10-CM | POA: Diagnosis not present

## 2019-10-24 DIAGNOSIS — N185 Chronic kidney disease, stage 5: Secondary | ICD-10-CM | POA: Diagnosis not present

## 2019-10-24 DIAGNOSIS — D631 Anemia in chronic kidney disease: Secondary | ICD-10-CM | POA: Diagnosis not present

## 2019-10-24 DIAGNOSIS — N186 End stage renal disease: Secondary | ICD-10-CM | POA: Diagnosis not present

## 2019-10-24 DIAGNOSIS — N2581 Secondary hyperparathyroidism of renal origin: Secondary | ICD-10-CM | POA: Diagnosis not present

## 2019-10-24 DIAGNOSIS — M25551 Pain in right hip: Secondary | ICD-10-CM | POA: Diagnosis not present

## 2019-10-25 DIAGNOSIS — D631 Anemia in chronic kidney disease: Secondary | ICD-10-CM | POA: Diagnosis not present

## 2019-10-25 DIAGNOSIS — N186 End stage renal disease: Secondary | ICD-10-CM | POA: Diagnosis not present

## 2019-10-25 DIAGNOSIS — N2581 Secondary hyperparathyroidism of renal origin: Secondary | ICD-10-CM | POA: Diagnosis not present

## 2019-10-26 DIAGNOSIS — N186 End stage renal disease: Secondary | ICD-10-CM | POA: Diagnosis not present

## 2019-10-26 DIAGNOSIS — N2581 Secondary hyperparathyroidism of renal origin: Secondary | ICD-10-CM | POA: Diagnosis not present

## 2019-10-26 DIAGNOSIS — D631 Anemia in chronic kidney disease: Secondary | ICD-10-CM | POA: Diagnosis not present

## 2019-10-28 DIAGNOSIS — N186 End stage renal disease: Secondary | ICD-10-CM | POA: Diagnosis not present

## 2019-10-29 DIAGNOSIS — R946 Abnormal results of thyroid function studies: Secondary | ICD-10-CM | POA: Diagnosis not present

## 2019-10-29 DIAGNOSIS — M25551 Pain in right hip: Secondary | ICD-10-CM | POA: Diagnosis not present

## 2019-10-29 DIAGNOSIS — N2581 Secondary hyperparathyroidism of renal origin: Secondary | ICD-10-CM | POA: Diagnosis not present

## 2019-10-29 DIAGNOSIS — M79641 Pain in right hand: Secondary | ICD-10-CM | POA: Diagnosis not present

## 2019-10-29 DIAGNOSIS — L732 Hidradenitis suppurativa: Secondary | ICD-10-CM | POA: Diagnosis not present

## 2019-10-29 DIAGNOSIS — D631 Anemia in chronic kidney disease: Secondary | ICD-10-CM | POA: Diagnosis not present

## 2019-10-29 DIAGNOSIS — N186 End stage renal disease: Secondary | ICD-10-CM | POA: Diagnosis not present

## 2019-10-29 DIAGNOSIS — M2550 Pain in unspecified joint: Secondary | ICD-10-CM | POA: Diagnosis not present

## 2019-10-29 DIAGNOSIS — N185 Chronic kidney disease, stage 5: Secondary | ICD-10-CM | POA: Diagnosis not present

## 2019-10-30 DIAGNOSIS — D631 Anemia in chronic kidney disease: Secondary | ICD-10-CM | POA: Diagnosis not present

## 2019-10-30 DIAGNOSIS — Z992 Dependence on renal dialysis: Secondary | ICD-10-CM | POA: Diagnosis not present

## 2019-10-30 DIAGNOSIS — N2581 Secondary hyperparathyroidism of renal origin: Secondary | ICD-10-CM | POA: Diagnosis not present

## 2019-10-30 DIAGNOSIS — N186 End stage renal disease: Secondary | ICD-10-CM | POA: Diagnosis not present

## 2019-10-31 DIAGNOSIS — N186 End stage renal disease: Secondary | ICD-10-CM | POA: Diagnosis not present

## 2019-10-31 DIAGNOSIS — D631 Anemia in chronic kidney disease: Secondary | ICD-10-CM | POA: Diagnosis not present

## 2019-10-31 DIAGNOSIS — N2581 Secondary hyperparathyroidism of renal origin: Secondary | ICD-10-CM | POA: Diagnosis not present

## 2019-11-01 DIAGNOSIS — D631 Anemia in chronic kidney disease: Secondary | ICD-10-CM | POA: Diagnosis not present

## 2019-11-01 DIAGNOSIS — N186 End stage renal disease: Secondary | ICD-10-CM | POA: Diagnosis not present

## 2019-11-01 DIAGNOSIS — N2581 Secondary hyperparathyroidism of renal origin: Secondary | ICD-10-CM | POA: Diagnosis not present

## 2019-11-02 DIAGNOSIS — N186 End stage renal disease: Secondary | ICD-10-CM | POA: Diagnosis not present

## 2019-11-02 DIAGNOSIS — D631 Anemia in chronic kidney disease: Secondary | ICD-10-CM | POA: Diagnosis not present

## 2019-11-02 DIAGNOSIS — N2581 Secondary hyperparathyroidism of renal origin: Secondary | ICD-10-CM | POA: Diagnosis not present

## 2019-11-06 DIAGNOSIS — N2581 Secondary hyperparathyroidism of renal origin: Secondary | ICD-10-CM | POA: Diagnosis not present

## 2019-11-06 DIAGNOSIS — D631 Anemia in chronic kidney disease: Secondary | ICD-10-CM | POA: Diagnosis not present

## 2019-11-06 DIAGNOSIS — N186 End stage renal disease: Secondary | ICD-10-CM | POA: Diagnosis not present

## 2019-11-07 DIAGNOSIS — E119 Type 2 diabetes mellitus without complications: Secondary | ICD-10-CM | POA: Diagnosis not present

## 2019-11-07 DIAGNOSIS — N186 End stage renal disease: Secondary | ICD-10-CM | POA: Diagnosis not present

## 2019-11-07 DIAGNOSIS — N2581 Secondary hyperparathyroidism of renal origin: Secondary | ICD-10-CM | POA: Diagnosis not present

## 2019-11-07 DIAGNOSIS — D631 Anemia in chronic kidney disease: Secondary | ICD-10-CM | POA: Diagnosis not present

## 2019-11-08 DIAGNOSIS — D631 Anemia in chronic kidney disease: Secondary | ICD-10-CM | POA: Diagnosis not present

## 2019-11-08 DIAGNOSIS — N2581 Secondary hyperparathyroidism of renal origin: Secondary | ICD-10-CM | POA: Diagnosis not present

## 2019-11-08 DIAGNOSIS — N186 End stage renal disease: Secondary | ICD-10-CM | POA: Diagnosis not present

## 2019-11-09 DIAGNOSIS — N186 End stage renal disease: Secondary | ICD-10-CM | POA: Diagnosis not present

## 2019-11-09 DIAGNOSIS — D631 Anemia in chronic kidney disease: Secondary | ICD-10-CM | POA: Diagnosis not present

## 2019-11-09 DIAGNOSIS — N2581 Secondary hyperparathyroidism of renal origin: Secondary | ICD-10-CM | POA: Diagnosis not present

## 2019-11-10 DIAGNOSIS — N186 End stage renal disease: Secondary | ICD-10-CM | POA: Diagnosis not present

## 2019-11-10 DIAGNOSIS — D631 Anemia in chronic kidney disease: Secondary | ICD-10-CM | POA: Diagnosis not present

## 2019-11-10 DIAGNOSIS — N2581 Secondary hyperparathyroidism of renal origin: Secondary | ICD-10-CM | POA: Diagnosis not present

## 2019-11-11 DIAGNOSIS — D631 Anemia in chronic kidney disease: Secondary | ICD-10-CM | POA: Diagnosis not present

## 2019-11-11 DIAGNOSIS — N186 End stage renal disease: Secondary | ICD-10-CM | POA: Diagnosis not present

## 2019-11-11 DIAGNOSIS — N2581 Secondary hyperparathyroidism of renal origin: Secondary | ICD-10-CM | POA: Diagnosis not present

## 2019-11-12 DIAGNOSIS — N186 End stage renal disease: Secondary | ICD-10-CM | POA: Diagnosis not present

## 2019-11-12 DIAGNOSIS — D631 Anemia in chronic kidney disease: Secondary | ICD-10-CM | POA: Diagnosis not present

## 2019-11-12 DIAGNOSIS — N2581 Secondary hyperparathyroidism of renal origin: Secondary | ICD-10-CM | POA: Diagnosis not present

## 2019-11-13 DIAGNOSIS — N2581 Secondary hyperparathyroidism of renal origin: Secondary | ICD-10-CM | POA: Diagnosis not present

## 2019-11-13 DIAGNOSIS — D631 Anemia in chronic kidney disease: Secondary | ICD-10-CM | POA: Diagnosis not present

## 2019-11-13 DIAGNOSIS — N186 End stage renal disease: Secondary | ICD-10-CM | POA: Diagnosis not present

## 2019-11-14 DIAGNOSIS — K420 Umbilical hernia with obstruction, without gangrene: Secondary | ICD-10-CM | POA: Diagnosis not present

## 2019-11-14 DIAGNOSIS — K429 Umbilical hernia without obstruction or gangrene: Secondary | ICD-10-CM | POA: Diagnosis not present

## 2019-11-14 DIAGNOSIS — N2581 Secondary hyperparathyroidism of renal origin: Secondary | ICD-10-CM | POA: Diagnosis not present

## 2019-11-14 DIAGNOSIS — D631 Anemia in chronic kidney disease: Secondary | ICD-10-CM | POA: Diagnosis not present

## 2019-11-14 DIAGNOSIS — N186 End stage renal disease: Secondary | ICD-10-CM | POA: Diagnosis not present

## 2019-11-15 DIAGNOSIS — N2581 Secondary hyperparathyroidism of renal origin: Secondary | ICD-10-CM | POA: Diagnosis not present

## 2019-11-15 DIAGNOSIS — D631 Anemia in chronic kidney disease: Secondary | ICD-10-CM | POA: Diagnosis not present

## 2019-11-15 DIAGNOSIS — N186 End stage renal disease: Secondary | ICD-10-CM | POA: Diagnosis not present

## 2019-11-16 DIAGNOSIS — N186 End stage renal disease: Secondary | ICD-10-CM | POA: Diagnosis not present

## 2019-11-16 DIAGNOSIS — N2581 Secondary hyperparathyroidism of renal origin: Secondary | ICD-10-CM | POA: Diagnosis not present

## 2019-11-16 DIAGNOSIS — D631 Anemia in chronic kidney disease: Secondary | ICD-10-CM | POA: Diagnosis not present

## 2019-11-17 DIAGNOSIS — N186 End stage renal disease: Secondary | ICD-10-CM | POA: Diagnosis not present

## 2019-11-17 DIAGNOSIS — N2581 Secondary hyperparathyroidism of renal origin: Secondary | ICD-10-CM | POA: Diagnosis not present

## 2019-11-17 DIAGNOSIS — D631 Anemia in chronic kidney disease: Secondary | ICD-10-CM | POA: Diagnosis not present

## 2019-11-18 DIAGNOSIS — N186 End stage renal disease: Secondary | ICD-10-CM | POA: Diagnosis not present

## 2019-11-18 DIAGNOSIS — D631 Anemia in chronic kidney disease: Secondary | ICD-10-CM | POA: Diagnosis not present

## 2019-11-18 DIAGNOSIS — N2581 Secondary hyperparathyroidism of renal origin: Secondary | ICD-10-CM | POA: Diagnosis not present

## 2019-11-19 DIAGNOSIS — N250 Renal osteodystrophy: Secondary | ICD-10-CM | POA: Diagnosis not present

## 2019-11-19 DIAGNOSIS — N2581 Secondary hyperparathyroidism of renal origin: Secondary | ICD-10-CM | POA: Diagnosis not present

## 2019-11-19 DIAGNOSIS — N186 End stage renal disease: Secondary | ICD-10-CM | POA: Diagnosis not present

## 2019-11-19 DIAGNOSIS — Z79891 Long term (current) use of opiate analgesic: Secondary | ICD-10-CM | POA: Diagnosis not present

## 2019-11-19 DIAGNOSIS — M2550 Pain in unspecified joint: Secondary | ICD-10-CM | POA: Diagnosis not present

## 2019-11-19 DIAGNOSIS — D631 Anemia in chronic kidney disease: Secondary | ICD-10-CM | POA: Diagnosis not present

## 2019-11-19 DIAGNOSIS — Z79899 Other long term (current) drug therapy: Secondary | ICD-10-CM | POA: Diagnosis not present

## 2019-11-19 DIAGNOSIS — G894 Chronic pain syndrome: Secondary | ICD-10-CM | POA: Diagnosis not present

## 2019-11-20 DIAGNOSIS — D631 Anemia in chronic kidney disease: Secondary | ICD-10-CM | POA: Diagnosis not present

## 2019-11-20 DIAGNOSIS — N186 End stage renal disease: Secondary | ICD-10-CM | POA: Diagnosis not present

## 2019-11-20 DIAGNOSIS — N2581 Secondary hyperparathyroidism of renal origin: Secondary | ICD-10-CM | POA: Diagnosis not present

## 2019-11-21 DIAGNOSIS — D631 Anemia in chronic kidney disease: Secondary | ICD-10-CM | POA: Diagnosis not present

## 2019-11-21 DIAGNOSIS — N186 End stage renal disease: Secondary | ICD-10-CM | POA: Diagnosis not present

## 2019-11-21 DIAGNOSIS — N2581 Secondary hyperparathyroidism of renal origin: Secondary | ICD-10-CM | POA: Diagnosis not present

## 2019-11-22 DIAGNOSIS — N186 End stage renal disease: Secondary | ICD-10-CM | POA: Diagnosis not present

## 2019-11-22 DIAGNOSIS — D631 Anemia in chronic kidney disease: Secondary | ICD-10-CM | POA: Diagnosis not present

## 2019-11-22 DIAGNOSIS — N2581 Secondary hyperparathyroidism of renal origin: Secondary | ICD-10-CM | POA: Diagnosis not present

## 2019-11-23 DIAGNOSIS — N186 End stage renal disease: Secondary | ICD-10-CM | POA: Diagnosis not present

## 2019-11-23 DIAGNOSIS — D631 Anemia in chronic kidney disease: Secondary | ICD-10-CM | POA: Diagnosis not present

## 2019-11-23 DIAGNOSIS — N2581 Secondary hyperparathyroidism of renal origin: Secondary | ICD-10-CM | POA: Diagnosis not present

## 2019-11-24 DIAGNOSIS — D631 Anemia in chronic kidney disease: Secondary | ICD-10-CM | POA: Diagnosis not present

## 2019-11-24 DIAGNOSIS — N2581 Secondary hyperparathyroidism of renal origin: Secondary | ICD-10-CM | POA: Diagnosis not present

## 2019-11-24 DIAGNOSIS — N186 End stage renal disease: Secondary | ICD-10-CM | POA: Diagnosis not present

## 2019-11-25 DIAGNOSIS — N2581 Secondary hyperparathyroidism of renal origin: Secondary | ICD-10-CM | POA: Diagnosis not present

## 2019-11-25 DIAGNOSIS — D631 Anemia in chronic kidney disease: Secondary | ICD-10-CM | POA: Diagnosis not present

## 2019-11-25 DIAGNOSIS — N186 End stage renal disease: Secondary | ICD-10-CM | POA: Diagnosis not present

## 2019-11-26 DIAGNOSIS — N2581 Secondary hyperparathyroidism of renal origin: Secondary | ICD-10-CM | POA: Diagnosis not present

## 2019-11-26 DIAGNOSIS — D631 Anemia in chronic kidney disease: Secondary | ICD-10-CM | POA: Diagnosis not present

## 2019-11-26 DIAGNOSIS — N186 End stage renal disease: Secondary | ICD-10-CM | POA: Diagnosis not present

## 2019-11-27 DIAGNOSIS — N2581 Secondary hyperparathyroidism of renal origin: Secondary | ICD-10-CM | POA: Diagnosis not present

## 2019-11-27 DIAGNOSIS — N186 End stage renal disease: Secondary | ICD-10-CM | POA: Diagnosis not present

## 2019-11-27 DIAGNOSIS — D631 Anemia in chronic kidney disease: Secondary | ICD-10-CM | POA: Diagnosis not present

## 2019-11-28 DIAGNOSIS — D631 Anemia in chronic kidney disease: Secondary | ICD-10-CM | POA: Diagnosis not present

## 2019-11-28 DIAGNOSIS — N186 End stage renal disease: Secondary | ICD-10-CM | POA: Diagnosis not present

## 2019-11-28 DIAGNOSIS — N2581 Secondary hyperparathyroidism of renal origin: Secondary | ICD-10-CM | POA: Diagnosis not present

## 2019-11-29 DIAGNOSIS — N186 End stage renal disease: Secondary | ICD-10-CM | POA: Diagnosis not present

## 2019-11-29 DIAGNOSIS — D631 Anemia in chronic kidney disease: Secondary | ICD-10-CM | POA: Diagnosis not present

## 2019-11-29 DIAGNOSIS — Z992 Dependence on renal dialysis: Secondary | ICD-10-CM | POA: Diagnosis not present

## 2019-11-29 DIAGNOSIS — N2581 Secondary hyperparathyroidism of renal origin: Secondary | ICD-10-CM | POA: Diagnosis not present

## 2019-11-30 DIAGNOSIS — D631 Anemia in chronic kidney disease: Secondary | ICD-10-CM | POA: Diagnosis not present

## 2019-11-30 DIAGNOSIS — N2581 Secondary hyperparathyroidism of renal origin: Secondary | ICD-10-CM | POA: Diagnosis not present

## 2019-11-30 DIAGNOSIS — N186 End stage renal disease: Secondary | ICD-10-CM | POA: Diagnosis not present

## 2019-12-01 DIAGNOSIS — N2581 Secondary hyperparathyroidism of renal origin: Secondary | ICD-10-CM | POA: Diagnosis not present

## 2019-12-01 DIAGNOSIS — D631 Anemia in chronic kidney disease: Secondary | ICD-10-CM | POA: Diagnosis not present

## 2019-12-01 DIAGNOSIS — N186 End stage renal disease: Secondary | ICD-10-CM | POA: Diagnosis not present

## 2019-12-02 DIAGNOSIS — D631 Anemia in chronic kidney disease: Secondary | ICD-10-CM | POA: Diagnosis not present

## 2019-12-02 DIAGNOSIS — N2581 Secondary hyperparathyroidism of renal origin: Secondary | ICD-10-CM | POA: Diagnosis not present

## 2019-12-02 DIAGNOSIS — N186 End stage renal disease: Secondary | ICD-10-CM | POA: Diagnosis not present

## 2019-12-03 DIAGNOSIS — N2581 Secondary hyperparathyroidism of renal origin: Secondary | ICD-10-CM | POA: Diagnosis not present

## 2019-12-03 DIAGNOSIS — N186 End stage renal disease: Secondary | ICD-10-CM | POA: Diagnosis not present

## 2019-12-03 DIAGNOSIS — D631 Anemia in chronic kidney disease: Secondary | ICD-10-CM | POA: Diagnosis not present

## 2019-12-04 ENCOUNTER — Other Ambulatory Visit: Payer: Self-pay

## 2019-12-04 ENCOUNTER — Encounter: Payer: Self-pay | Admitting: "Endocrinology

## 2019-12-04 ENCOUNTER — Ambulatory Visit (INDEPENDENT_AMBULATORY_CARE_PROVIDER_SITE_OTHER): Payer: Medicare Other | Admitting: "Endocrinology

## 2019-12-04 VITALS — BP 145/106 | HR 106 | Ht 67.0 in | Wt 179.0 lb

## 2019-12-04 DIAGNOSIS — E213 Hyperparathyroidism, unspecified: Secondary | ICD-10-CM | POA: Diagnosis not present

## 2019-12-04 DIAGNOSIS — E212 Other hyperparathyroidism: Secondary | ICD-10-CM

## 2019-12-04 DIAGNOSIS — N2581 Secondary hyperparathyroidism of renal origin: Secondary | ICD-10-CM | POA: Diagnosis not present

## 2019-12-04 DIAGNOSIS — N186 End stage renal disease: Secondary | ICD-10-CM | POA: Diagnosis not present

## 2019-12-04 DIAGNOSIS — D631 Anemia in chronic kidney disease: Secondary | ICD-10-CM | POA: Diagnosis not present

## 2019-12-04 NOTE — Progress Notes (Signed)
Endocrinology Consult Note                                            12/04/2019, 9:10 PM   Subjective:    Patient ID: Amy Camacho, female    DOB: [birth year], PCP Allwardt, Yetta Flock, PA   Past Medical History:  Diagnosis Date  . CKD (chronic kidney disease) requiring chronic dialysis [facility])    Past Surgical History:  Procedure Laterality Date  . DIALYSIS/PERMA CATHETER INSERTION    . HERNIA REPAIR     Social History   Socioeconomic History  . Marital status: Single    Spouse name: Not on file  . Number of children: Not on file  . Years of education: Not on file  . Highest education level: Not on file  Occupational History  . Not on file  Tobacco Use  . Smoking status: Current Every Day Smoker    Packs/day: 0.50    Years: 3.00    Pack years: 1.50  . Smokeless tobacco: Never Used  Vaping Use  . Vaping Use: Never used  Substance and Sexual Activity  . Alcohol use: Never  . Drug use: Never  . Sexual activity: Not on file  Other Topics Concern  . Not on file  Social History Narrative  . Not on file   Social Determinants of Health   Financial Resource Strain:   . Difficulty of Paying Living Expenses: Not on file  Food Insecurity:   . Worried About Charity fundraiser in the Last Year: Not on file  . Ran Out of Food in the Last Year: Not on file  Transportation Needs:   . Lack of Transportation (Medical): Not on file  . Lack of Transportation (Non-Medical): Not on file  Physical Activity:   . Days of Exercise per Week: Not on file  . Minutes of Exercise per Session: Not on file  Stress:   . Feeling of Stress : Not on file  Social Connections:   . Frequency of Communication with Friends and Family: Not on file  . Frequency of Social Gatherings with Friends and Family: Not on file  . Attends Religious Services: Not on file  . Active Member of Clubs or Organizations: Not on file  . Attends Archivist Meetings: Not on file  . Marital Status:  Not on file   History reviewed. No pertinent family history. Outpatient Encounter Medications as of 12/04/2019  Medication Sig  . losartan (COZAAR) 50 MG tablet Take by mouth daily.  . sevelamer carbonate (RENVELA) 800 MG tablet Take by mouth 3 (three) times daily.  Marland Kitchen HYDROcodone-acetaminophen (NORCO) 10-325 MG tablet Take 1 tablet by mouth 4 (four) times daily as needed.   No facility-administered encounter medications on file as of 12/04/2019.   ALLERGIES: No Known Allergies  VACCINATION STATUS:  There is no immunization history on file for this patient.  HPI Amy Camacho is 24 y.o. female who presents today with a medical history as above. she is being seen in consultation for elevated PTH requested by Allwardt, Alyssa, PA. Patient is a poor historian.  Her history is obtained mainly from chart review.  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  She was treated with various medications including Cytoxan IV.  However, since 2018 patient has been on dialysis post hemodialysis and currently peritoneal dialysis while awaiting transplant. She is still under the care of nephrology at [facility]. Review of her records did not show any evidence of hypercalcemia.  Her PTH has been high for the length of her CKD history. The last 3 PTH measurements from the most recent are 868, 1200, 1043. Her calcium was 8.4, on November 07, 2019 when her PTH was 868. -Review of her records did not reveal any parathyroid scan or bone density.  She does not recall if she ever had any of these studies either.  She denies family history of parathyroid, thyroid, pituitary, pancreatic dysfunctions. -She denies history of nephrolithiasis, osteoporosis, seizure disorder.  She is not on any calcium supplements.  However she is on a phosphate binder Renvela 800 mg 3  times a day. She is also on losartan for blood pressure management. She has chronic hypoalbuminemia.  Her corrected calcium ranges between 8.4 to 9 mg per DL.  Review of Systems  Constitutional: + Fluctuating body weight, + chronic fatigue, no subjective hyperthermia, no subjective hypothermia Eyes: no blurry vision, no xerophthalmia ENT: no sore throat, no nodules palpated in throat, no dysphagia/odynophagia, no hoarseness Cardiovascular: no Chest Pain, no Shortness of Breath, no palpitations, no leg swelling Respiratory: no cough, no shortness of breath Gastrointestinal: no Nausea/Vomiting/Diarhhea Musculoskeletal: no muscle/joint aches, + diffuse arthralgias Skin: no rashes Neurological: no tremors, no numbness, no tingling, no dizziness Psychiatric: no depression, no anxiety  Objective:    Vitals with BMI 12/04/2019  Height 5\' 7"   Weight 179 lbs  BMI 46.56  Systolic 812  Diastolic 751  Pulse 700    BP (!) 145/106   Pulse (!) 106   Ht 5\' 7"  (1.702 m)   Wt 179 lb (81.2 kg)   BMI 28.04 kg/m   Wt Readings from Last 3 Encounters:  12/04/19 179 lb (81.2 kg)    Physical Exam  Constitutional:  Body mass index is 28.04 kg/m.,  not in acute distress, normal state of mind Eyes: PERRLA, EOMI, no exophthalmos ENT: moist mucous membranes, no gross thyromegaly, no gross cervical lymphadenopathy Cardiovascular: normal precordial activity, Regular Rate and Rhythm, no Murmur/Rubs/Gallops Respiratory:  adequate breathing efforts, no gross chest deformity, Clear to auscultation bilaterally Gastrointestinal: abdomen soft, Non -tender, No distension, Bowel Sounds present, no gross organomegaly Musculoskeletal: no gross deformities, strength intact in all four extremities Skin: moist, warm, no rashes Neurological: no tremor with outstretched hands, Deep tendon reflexes normal in bilateral lower extremities.  Her last 3 PTH measurements, most recent 868, associated calcium was 8.4 on  November 07, 2019.  PTH INTACT 868 1200 1043      Assessment & Plan:   1.  Secondary hyperparathyroidism  - NM Parathyroid W/Spect/CT; Future - DG Bone Density   - Letita Prentiss  is being seen at a kind request of Allwardt, Alyssa, PA. - I have reviewed her available endocrine records and clinically evaluated the patient. 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  Patient with long and complicated history of IgA nephropathy with crescentic glomerulonephritis with unsuccessful treatment.  Currently on  Dialysis for approximately the last 3 years. -With the absence of documented hypercalcemia, tertiary hyperparathyroidism is unlikely. -However, this patient will benefit from work-up with imaging studies including parathyroid scan as well as bone density. -Her corrected calcium for hypoalbuminemia ranges between 8.4-9 mg per DL, may not need calcium  supplement at this time.  She will not need active or inactive vitamin D supplements either. She is already on noncalcium containing phosphate binder which I agree with, and under nephrology care at [facility].  She is encouraged to continue follow-up. -Reportedly, she is on renal transplant list. -She will have repeat PTH/calcium during her next visit. -She will be considered for treatment interventions if she is found to have osteoporosis.  Adynamic bone disease is  unlikely with appropriately elevated PTH of 800+.    She will  be considered for surgical intervention if she is found to have tertiary hyperparathyroidism as appropriate with consultation with nephrology.  She will be best served with nephrology care if she is confirmed to have secondary/tertiary hyperparathyroidism.  -she will return in 2 week to review her imaging studies.    - I did not initiate any new prescriptions today. - she is advised to maintain close follow up with Allwardt, Alyssa, PA for primary  care needs.   - Time spent with the patient: 60 minutes, of which >50% was spent in  counseling her about her hyperparathyroidism and the rest in obtaining information about her medical history, symptoms, reviewing her previous labs/studies ( including abstractions from other facilities),  evaluations, and treatments,  and developing a plan to confirm diagnosis and long term treatment based on the latest standards of care/guidelines; and documenting her care.  Amy Camacho participated in the discussions, expressed understanding, and voiced agreement with the above plans.  All questions were answered to her satisfaction. she is encouraged to contact clinic should she have any questions or concerns prior to her return visit.  Follow up plan: Return in about 2 weeks (around 12/18/2019).   Glade Lloyd, MD [facility] [address] [place], [place] [address] Phone: [phone]  Fax: [phone]     12/04/2019, 9:10 PM  This note was partially dictated with voice recognition software. Similar sounding words can be transcribed inadequately or may not  be corrected upon review.

## 2019-12-05 DIAGNOSIS — D631 Anemia in chronic kidney disease: Secondary | ICD-10-CM | POA: Diagnosis not present

## 2019-12-05 DIAGNOSIS — N2581 Secondary hyperparathyroidism of renal origin: Secondary | ICD-10-CM | POA: Diagnosis not present

## 2019-12-05 DIAGNOSIS — N186 End stage renal disease: Secondary | ICD-10-CM | POA: Diagnosis not present

## 2019-12-06 DIAGNOSIS — Z4932 Encounter for adequacy testing for peritoneal dialysis: Secondary | ICD-10-CM | POA: Diagnosis not present

## 2019-12-06 DIAGNOSIS — E119 Type 2 diabetes mellitus without complications: Secondary | ICD-10-CM | POA: Diagnosis not present

## 2019-12-06 DIAGNOSIS — N186 End stage renal disease: Secondary | ICD-10-CM | POA: Diagnosis not present

## 2019-12-06 DIAGNOSIS — D631 Anemia in chronic kidney disease: Secondary | ICD-10-CM | POA: Diagnosis not present

## 2019-12-06 DIAGNOSIS — N2581 Secondary hyperparathyroidism of renal origin: Secondary | ICD-10-CM | POA: Diagnosis not present

## 2019-12-07 DIAGNOSIS — N2581 Secondary hyperparathyroidism of renal origin: Secondary | ICD-10-CM | POA: Diagnosis not present

## 2019-12-07 DIAGNOSIS — N186 End stage renal disease: Secondary | ICD-10-CM | POA: Diagnosis not present

## 2019-12-07 DIAGNOSIS — D631 Anemia in chronic kidney disease: Secondary | ICD-10-CM | POA: Diagnosis not present

## 2019-12-08 DIAGNOSIS — N186 End stage renal disease: Secondary | ICD-10-CM | POA: Diagnosis not present

## 2019-12-08 DIAGNOSIS — D631 Anemia in chronic kidney disease: Secondary | ICD-10-CM | POA: Diagnosis not present

## 2019-12-08 DIAGNOSIS — N2581 Secondary hyperparathyroidism of renal origin: Secondary | ICD-10-CM | POA: Diagnosis not present

## 2019-12-09 DIAGNOSIS — N186 End stage renal disease: Secondary | ICD-10-CM | POA: Diagnosis not present

## 2019-12-09 DIAGNOSIS — D631 Anemia in chronic kidney disease: Secondary | ICD-10-CM | POA: Diagnosis not present

## 2019-12-09 DIAGNOSIS — N2581 Secondary hyperparathyroidism of renal origin: Secondary | ICD-10-CM | POA: Diagnosis not present

## 2019-12-10 DIAGNOSIS — D631 Anemia in chronic kidney disease: Secondary | ICD-10-CM | POA: Diagnosis not present

## 2019-12-10 DIAGNOSIS — N2581 Secondary hyperparathyroidism of renal origin: Secondary | ICD-10-CM | POA: Diagnosis not present

## 2019-12-10 DIAGNOSIS — N186 End stage renal disease: Secondary | ICD-10-CM | POA: Diagnosis not present

## 2019-12-11 DIAGNOSIS — N2581 Secondary hyperparathyroidism of renal origin: Secondary | ICD-10-CM | POA: Diagnosis not present

## 2019-12-11 DIAGNOSIS — N186 End stage renal disease: Secondary | ICD-10-CM | POA: Diagnosis not present

## 2019-12-11 DIAGNOSIS — D631 Anemia in chronic kidney disease: Secondary | ICD-10-CM | POA: Diagnosis not present

## 2019-12-12 DIAGNOSIS — N2581 Secondary hyperparathyroidism of renal origin: Secondary | ICD-10-CM | POA: Diagnosis not present

## 2019-12-12 DIAGNOSIS — D631 Anemia in chronic kidney disease: Secondary | ICD-10-CM | POA: Diagnosis not present

## 2019-12-12 DIAGNOSIS — N186 End stage renal disease: Secondary | ICD-10-CM | POA: Diagnosis not present

## 2019-12-13 DIAGNOSIS — N186 End stage renal disease: Secondary | ICD-10-CM | POA: Diagnosis not present

## 2019-12-13 DIAGNOSIS — N2581 Secondary hyperparathyroidism of renal origin: Secondary | ICD-10-CM | POA: Diagnosis not present

## 2019-12-13 DIAGNOSIS — D631 Anemia in chronic kidney disease: Secondary | ICD-10-CM | POA: Diagnosis not present

## 2019-12-14 DIAGNOSIS — N186 End stage renal disease: Secondary | ICD-10-CM | POA: Diagnosis not present

## 2019-12-14 DIAGNOSIS — D631 Anemia in chronic kidney disease: Secondary | ICD-10-CM | POA: Diagnosis not present

## 2019-12-14 DIAGNOSIS — N2581 Secondary hyperparathyroidism of renal origin: Secondary | ICD-10-CM | POA: Diagnosis not present

## 2019-12-16 DIAGNOSIS — D631 Anemia in chronic kidney disease: Secondary | ICD-10-CM | POA: Diagnosis not present

## 2019-12-16 DIAGNOSIS — N186 End stage renal disease: Secondary | ICD-10-CM | POA: Diagnosis not present

## 2019-12-16 DIAGNOSIS — N2581 Secondary hyperparathyroidism of renal origin: Secondary | ICD-10-CM | POA: Diagnosis not present

## 2019-12-17 DIAGNOSIS — N186 End stage renal disease: Secondary | ICD-10-CM | POA: Diagnosis not present

## 2019-12-17 DIAGNOSIS — D631 Anemia in chronic kidney disease: Secondary | ICD-10-CM | POA: Diagnosis not present

## 2019-12-17 DIAGNOSIS — N2581 Secondary hyperparathyroidism of renal origin: Secondary | ICD-10-CM | POA: Diagnosis not present

## 2019-12-18 ENCOUNTER — Ambulatory Visit: Payer: Medicare Other | Admitting: "Endocrinology

## 2019-12-18 DIAGNOSIS — N186 End stage renal disease: Secondary | ICD-10-CM | POA: Diagnosis not present

## 2019-12-18 DIAGNOSIS — G894 Chronic pain syndrome: Secondary | ICD-10-CM | POA: Diagnosis not present

## 2019-12-18 DIAGNOSIS — N2581 Secondary hyperparathyroidism of renal origin: Secondary | ICD-10-CM | POA: Diagnosis not present

## 2019-12-18 DIAGNOSIS — N250 Renal osteodystrophy: Secondary | ICD-10-CM | POA: Diagnosis not present

## 2019-12-18 DIAGNOSIS — D631 Anemia in chronic kidney disease: Secondary | ICD-10-CM | POA: Diagnosis not present

## 2019-12-18 DIAGNOSIS — M2550 Pain in unspecified joint: Secondary | ICD-10-CM | POA: Diagnosis not present

## 2019-12-20 DIAGNOSIS — D631 Anemia in chronic kidney disease: Secondary | ICD-10-CM | POA: Diagnosis not present

## 2019-12-20 DIAGNOSIS — N2581 Secondary hyperparathyroidism of renal origin: Secondary | ICD-10-CM | POA: Diagnosis not present

## 2019-12-20 DIAGNOSIS — N186 End stage renal disease: Secondary | ICD-10-CM | POA: Diagnosis not present

## 2019-12-21 ENCOUNTER — Encounter (HOSPITAL_COMMUNITY): Admission: RE | Admit: 2019-12-21 | Payer: Medicare Other | Source: Ambulatory Visit

## 2019-12-21 ENCOUNTER — Encounter (HOSPITAL_COMMUNITY): Payer: Medicare Other

## 2019-12-23 DIAGNOSIS — N2581 Secondary hyperparathyroidism of renal origin: Secondary | ICD-10-CM | POA: Diagnosis not present

## 2019-12-23 DIAGNOSIS — D631 Anemia in chronic kidney disease: Secondary | ICD-10-CM | POA: Diagnosis not present

## 2019-12-23 DIAGNOSIS — N186 End stage renal disease: Secondary | ICD-10-CM | POA: Diagnosis not present

## 2019-12-24 DIAGNOSIS — D631 Anemia in chronic kidney disease: Secondary | ICD-10-CM | POA: Diagnosis not present

## 2019-12-24 DIAGNOSIS — N186 End stage renal disease: Secondary | ICD-10-CM | POA: Diagnosis not present

## 2019-12-24 DIAGNOSIS — N2581 Secondary hyperparathyroidism of renal origin: Secondary | ICD-10-CM | POA: Diagnosis not present

## 2019-12-25 DIAGNOSIS — N186 End stage renal disease: Secondary | ICD-10-CM | POA: Diagnosis not present

## 2019-12-25 DIAGNOSIS — D631 Anemia in chronic kidney disease: Secondary | ICD-10-CM | POA: Diagnosis not present

## 2019-12-25 DIAGNOSIS — N2581 Secondary hyperparathyroidism of renal origin: Secondary | ICD-10-CM | POA: Diagnosis not present

## 2019-12-26 DIAGNOSIS — N186 End stage renal disease: Secondary | ICD-10-CM | POA: Diagnosis not present

## 2019-12-26 DIAGNOSIS — D631 Anemia in chronic kidney disease: Secondary | ICD-10-CM | POA: Diagnosis not present

## 2019-12-26 DIAGNOSIS — N2581 Secondary hyperparathyroidism of renal origin: Secondary | ICD-10-CM | POA: Diagnosis not present

## 2019-12-27 ENCOUNTER — Ambulatory Visit: Payer: Medicare Other | Admitting: "Endocrinology

## 2019-12-27 DIAGNOSIS — N2581 Secondary hyperparathyroidism of renal origin: Secondary | ICD-10-CM | POA: Diagnosis not present

## 2019-12-27 DIAGNOSIS — N186 End stage renal disease: Secondary | ICD-10-CM | POA: Diagnosis not present

## 2019-12-27 DIAGNOSIS — D631 Anemia in chronic kidney disease: Secondary | ICD-10-CM | POA: Diagnosis not present

## 2019-12-28 DIAGNOSIS — N186 End stage renal disease: Secondary | ICD-10-CM | POA: Diagnosis not present

## 2019-12-28 DIAGNOSIS — D631 Anemia in chronic kidney disease: Secondary | ICD-10-CM | POA: Diagnosis not present

## 2019-12-28 DIAGNOSIS — N2581 Secondary hyperparathyroidism of renal origin: Secondary | ICD-10-CM | POA: Diagnosis not present

## 2019-12-30 DIAGNOSIS — D631 Anemia in chronic kidney disease: Secondary | ICD-10-CM | POA: Diagnosis not present

## 2019-12-30 DIAGNOSIS — N186 End stage renal disease: Secondary | ICD-10-CM | POA: Diagnosis not present

## 2019-12-30 DIAGNOSIS — Z992 Dependence on renal dialysis: Secondary | ICD-10-CM | POA: Diagnosis not present

## 2019-12-30 DIAGNOSIS — N2581 Secondary hyperparathyroidism of renal origin: Secondary | ICD-10-CM | POA: Diagnosis not present

## 2019-12-31 DIAGNOSIS — D631 Anemia in chronic kidney disease: Secondary | ICD-10-CM | POA: Diagnosis not present

## 2019-12-31 DIAGNOSIS — N2581 Secondary hyperparathyroidism of renal origin: Secondary | ICD-10-CM | POA: Diagnosis not present

## 2019-12-31 DIAGNOSIS — N186 End stage renal disease: Secondary | ICD-10-CM | POA: Diagnosis not present

## 2019-12-31 DIAGNOSIS — I10 Essential (primary) hypertension: Secondary | ICD-10-CM | POA: Diagnosis not present

## 2020-01-02 DIAGNOSIS — D631 Anemia in chronic kidney disease: Secondary | ICD-10-CM | POA: Diagnosis not present

## 2020-01-02 DIAGNOSIS — I10 Essential (primary) hypertension: Secondary | ICD-10-CM | POA: Diagnosis not present

## 2020-01-02 DIAGNOSIS — N2581 Secondary hyperparathyroidism of renal origin: Secondary | ICD-10-CM | POA: Diagnosis not present

## 2020-01-02 DIAGNOSIS — N186 End stage renal disease: Secondary | ICD-10-CM | POA: Diagnosis not present

## 2020-01-03 DIAGNOSIS — D631 Anemia in chronic kidney disease: Secondary | ICD-10-CM | POA: Diagnosis not present

## 2020-01-03 DIAGNOSIS — I10 Essential (primary) hypertension: Secondary | ICD-10-CM | POA: Diagnosis not present

## 2020-01-03 DIAGNOSIS — N2581 Secondary hyperparathyroidism of renal origin: Secondary | ICD-10-CM | POA: Diagnosis not present

## 2020-01-03 DIAGNOSIS — N186 End stage renal disease: Secondary | ICD-10-CM | POA: Diagnosis not present

## 2020-01-05 DIAGNOSIS — N2581 Secondary hyperparathyroidism of renal origin: Secondary | ICD-10-CM | POA: Diagnosis not present

## 2020-01-05 DIAGNOSIS — I10 Essential (primary) hypertension: Secondary | ICD-10-CM | POA: Diagnosis not present

## 2020-01-05 DIAGNOSIS — N186 End stage renal disease: Secondary | ICD-10-CM | POA: Diagnosis not present

## 2020-01-05 DIAGNOSIS — D631 Anemia in chronic kidney disease: Secondary | ICD-10-CM | POA: Diagnosis not present

## 2020-01-06 DIAGNOSIS — D631 Anemia in chronic kidney disease: Secondary | ICD-10-CM | POA: Diagnosis not present

## 2020-01-06 DIAGNOSIS — N2581 Secondary hyperparathyroidism of renal origin: Secondary | ICD-10-CM | POA: Diagnosis not present

## 2020-01-06 DIAGNOSIS — I10 Essential (primary) hypertension: Secondary | ICD-10-CM | POA: Diagnosis not present

## 2020-01-06 DIAGNOSIS — N186 End stage renal disease: Secondary | ICD-10-CM | POA: Diagnosis not present

## 2020-01-07 DIAGNOSIS — I10 Essential (primary) hypertension: Secondary | ICD-10-CM | POA: Diagnosis not present

## 2020-01-07 DIAGNOSIS — D631 Anemia in chronic kidney disease: Secondary | ICD-10-CM | POA: Diagnosis not present

## 2020-01-07 DIAGNOSIS — N2581 Secondary hyperparathyroidism of renal origin: Secondary | ICD-10-CM | POA: Diagnosis not present

## 2020-01-07 DIAGNOSIS — N186 End stage renal disease: Secondary | ICD-10-CM | POA: Diagnosis not present

## 2020-01-08 DIAGNOSIS — D631 Anemia in chronic kidney disease: Secondary | ICD-10-CM | POA: Diagnosis not present

## 2020-01-08 DIAGNOSIS — N186 End stage renal disease: Secondary | ICD-10-CM | POA: Diagnosis not present

## 2020-01-08 DIAGNOSIS — I10 Essential (primary) hypertension: Secondary | ICD-10-CM | POA: Diagnosis not present

## 2020-01-08 DIAGNOSIS — N2581 Secondary hyperparathyroidism of renal origin: Secondary | ICD-10-CM | POA: Diagnosis not present

## 2020-01-09 DIAGNOSIS — N2581 Secondary hyperparathyroidism of renal origin: Secondary | ICD-10-CM | POA: Diagnosis not present

## 2020-01-09 DIAGNOSIS — E119 Type 2 diabetes mellitus without complications: Secondary | ICD-10-CM | POA: Diagnosis not present

## 2020-01-09 DIAGNOSIS — N186 End stage renal disease: Secondary | ICD-10-CM | POA: Diagnosis not present

## 2020-01-09 DIAGNOSIS — I10 Essential (primary) hypertension: Secondary | ICD-10-CM | POA: Diagnosis not present

## 2020-01-09 DIAGNOSIS — D631 Anemia in chronic kidney disease: Secondary | ICD-10-CM | POA: Diagnosis not present

## 2020-01-10 DIAGNOSIS — N186 End stage renal disease: Secondary | ICD-10-CM | POA: Diagnosis not present

## 2020-01-10 DIAGNOSIS — D631 Anemia in chronic kidney disease: Secondary | ICD-10-CM | POA: Diagnosis not present

## 2020-01-10 DIAGNOSIS — N2581 Secondary hyperparathyroidism of renal origin: Secondary | ICD-10-CM | POA: Diagnosis not present

## 2020-01-10 DIAGNOSIS — L732 Hidradenitis suppurativa: Secondary | ICD-10-CM | POA: Diagnosis not present

## 2020-01-10 DIAGNOSIS — I10 Essential (primary) hypertension: Secondary | ICD-10-CM | POA: Diagnosis not present

## 2020-01-11 DIAGNOSIS — I10 Essential (primary) hypertension: Secondary | ICD-10-CM | POA: Diagnosis not present

## 2020-01-11 DIAGNOSIS — D631 Anemia in chronic kidney disease: Secondary | ICD-10-CM | POA: Diagnosis not present

## 2020-01-11 DIAGNOSIS — N186 End stage renal disease: Secondary | ICD-10-CM | POA: Diagnosis not present

## 2020-01-11 DIAGNOSIS — N2581 Secondary hyperparathyroidism of renal origin: Secondary | ICD-10-CM | POA: Diagnosis not present

## 2020-01-12 DIAGNOSIS — D631 Anemia in chronic kidney disease: Secondary | ICD-10-CM | POA: Diagnosis not present

## 2020-01-12 DIAGNOSIS — I10 Essential (primary) hypertension: Secondary | ICD-10-CM | POA: Diagnosis not present

## 2020-01-12 DIAGNOSIS — N2581 Secondary hyperparathyroidism of renal origin: Secondary | ICD-10-CM | POA: Diagnosis not present

## 2020-01-12 DIAGNOSIS — N186 End stage renal disease: Secondary | ICD-10-CM | POA: Diagnosis not present

## 2020-01-13 DIAGNOSIS — N186 End stage renal disease: Secondary | ICD-10-CM | POA: Diagnosis not present

## 2020-01-13 DIAGNOSIS — I10 Essential (primary) hypertension: Secondary | ICD-10-CM | POA: Diagnosis not present

## 2020-01-13 DIAGNOSIS — N2581 Secondary hyperparathyroidism of renal origin: Secondary | ICD-10-CM | POA: Diagnosis not present

## 2020-01-13 DIAGNOSIS — D631 Anemia in chronic kidney disease: Secondary | ICD-10-CM | POA: Diagnosis not present

## 2020-01-14 DIAGNOSIS — N186 End stage renal disease: Secondary | ICD-10-CM | POA: Diagnosis not present

## 2020-01-14 DIAGNOSIS — D631 Anemia in chronic kidney disease: Secondary | ICD-10-CM | POA: Diagnosis not present

## 2020-01-14 DIAGNOSIS — N2581 Secondary hyperparathyroidism of renal origin: Secondary | ICD-10-CM | POA: Diagnosis not present

## 2020-01-14 DIAGNOSIS — I10 Essential (primary) hypertension: Secondary | ICD-10-CM | POA: Diagnosis not present

## 2020-01-15 DIAGNOSIS — D631 Anemia in chronic kidney disease: Secondary | ICD-10-CM | POA: Diagnosis not present

## 2020-01-15 DIAGNOSIS — N186 End stage renal disease: Secondary | ICD-10-CM | POA: Diagnosis not present

## 2020-01-15 DIAGNOSIS — M2550 Pain in unspecified joint: Secondary | ICD-10-CM | POA: Diagnosis not present

## 2020-01-15 DIAGNOSIS — N2581 Secondary hyperparathyroidism of renal origin: Secondary | ICD-10-CM | POA: Diagnosis not present

## 2020-01-15 DIAGNOSIS — N250 Renal osteodystrophy: Secondary | ICD-10-CM | POA: Diagnosis not present

## 2020-01-15 DIAGNOSIS — I10 Essential (primary) hypertension: Secondary | ICD-10-CM | POA: Diagnosis not present

## 2020-01-16 DIAGNOSIS — N2581 Secondary hyperparathyroidism of renal origin: Secondary | ICD-10-CM | POA: Diagnosis not present

## 2020-01-16 DIAGNOSIS — D631 Anemia in chronic kidney disease: Secondary | ICD-10-CM | POA: Diagnosis not present

## 2020-01-16 DIAGNOSIS — N186 End stage renal disease: Secondary | ICD-10-CM | POA: Diagnosis not present

## 2020-01-16 DIAGNOSIS — I10 Essential (primary) hypertension: Secondary | ICD-10-CM | POA: Diagnosis not present

## 2020-01-17 DIAGNOSIS — N2581 Secondary hyperparathyroidism of renal origin: Secondary | ICD-10-CM | POA: Diagnosis not present

## 2020-01-17 DIAGNOSIS — D631 Anemia in chronic kidney disease: Secondary | ICD-10-CM | POA: Diagnosis not present

## 2020-01-17 DIAGNOSIS — I10 Essential (primary) hypertension: Secondary | ICD-10-CM | POA: Diagnosis not present

## 2020-01-17 DIAGNOSIS — N186 End stage renal disease: Secondary | ICD-10-CM | POA: Diagnosis not present

## 2020-01-18 DIAGNOSIS — I10 Essential (primary) hypertension: Secondary | ICD-10-CM | POA: Diagnosis not present

## 2020-01-18 DIAGNOSIS — N2581 Secondary hyperparathyroidism of renal origin: Secondary | ICD-10-CM | POA: Diagnosis not present

## 2020-01-18 DIAGNOSIS — D631 Anemia in chronic kidney disease: Secondary | ICD-10-CM | POA: Diagnosis not present

## 2020-01-18 DIAGNOSIS — N186 End stage renal disease: Secondary | ICD-10-CM | POA: Diagnosis not present

## 2020-01-19 DIAGNOSIS — I10 Essential (primary) hypertension: Secondary | ICD-10-CM | POA: Diagnosis not present

## 2020-01-19 DIAGNOSIS — N2581 Secondary hyperparathyroidism of renal origin: Secondary | ICD-10-CM | POA: Diagnosis not present

## 2020-01-19 DIAGNOSIS — N186 End stage renal disease: Secondary | ICD-10-CM | POA: Diagnosis not present

## 2020-01-19 DIAGNOSIS — D631 Anemia in chronic kidney disease: Secondary | ICD-10-CM | POA: Diagnosis not present

## 2020-01-20 DIAGNOSIS — I10 Essential (primary) hypertension: Secondary | ICD-10-CM | POA: Diagnosis not present

## 2020-01-20 DIAGNOSIS — N2581 Secondary hyperparathyroidism of renal origin: Secondary | ICD-10-CM | POA: Diagnosis not present

## 2020-01-20 DIAGNOSIS — D631 Anemia in chronic kidney disease: Secondary | ICD-10-CM | POA: Diagnosis not present

## 2020-01-20 DIAGNOSIS — N186 End stage renal disease: Secondary | ICD-10-CM | POA: Diagnosis not present

## 2020-01-21 DIAGNOSIS — N186 End stage renal disease: Secondary | ICD-10-CM | POA: Diagnosis not present

## 2020-01-21 DIAGNOSIS — N2581 Secondary hyperparathyroidism of renal origin: Secondary | ICD-10-CM | POA: Diagnosis not present

## 2020-01-21 DIAGNOSIS — I10 Essential (primary) hypertension: Secondary | ICD-10-CM | POA: Diagnosis not present

## 2020-01-21 DIAGNOSIS — D631 Anemia in chronic kidney disease: Secondary | ICD-10-CM | POA: Diagnosis not present

## 2020-01-22 DIAGNOSIS — I10 Essential (primary) hypertension: Secondary | ICD-10-CM | POA: Diagnosis not present

## 2020-01-22 DIAGNOSIS — D631 Anemia in chronic kidney disease: Secondary | ICD-10-CM | POA: Diagnosis not present

## 2020-01-22 DIAGNOSIS — N2581 Secondary hyperparathyroidism of renal origin: Secondary | ICD-10-CM | POA: Diagnosis not present

## 2020-01-22 DIAGNOSIS — N186 End stage renal disease: Secondary | ICD-10-CM | POA: Diagnosis not present

## 2020-01-23 DIAGNOSIS — D631 Anemia in chronic kidney disease: Secondary | ICD-10-CM | POA: Diagnosis not present

## 2020-01-23 DIAGNOSIS — N2581 Secondary hyperparathyroidism of renal origin: Secondary | ICD-10-CM | POA: Diagnosis not present

## 2020-01-23 DIAGNOSIS — I10 Essential (primary) hypertension: Secondary | ICD-10-CM | POA: Diagnosis not present

## 2020-01-23 DIAGNOSIS — N186 End stage renal disease: Secondary | ICD-10-CM | POA: Diagnosis not present

## 2020-01-24 DIAGNOSIS — N186 End stage renal disease: Secondary | ICD-10-CM | POA: Diagnosis not present

## 2020-01-24 DIAGNOSIS — D631 Anemia in chronic kidney disease: Secondary | ICD-10-CM | POA: Diagnosis not present

## 2020-01-24 DIAGNOSIS — N2581 Secondary hyperparathyroidism of renal origin: Secondary | ICD-10-CM | POA: Diagnosis not present

## 2020-01-24 DIAGNOSIS — I10 Essential (primary) hypertension: Secondary | ICD-10-CM | POA: Diagnosis not present

## 2020-01-25 DIAGNOSIS — D631 Anemia in chronic kidney disease: Secondary | ICD-10-CM | POA: Diagnosis not present

## 2020-01-25 DIAGNOSIS — N2581 Secondary hyperparathyroidism of renal origin: Secondary | ICD-10-CM | POA: Diagnosis not present

## 2020-01-25 DIAGNOSIS — N186 End stage renal disease: Secondary | ICD-10-CM | POA: Diagnosis not present

## 2020-01-25 DIAGNOSIS — I10 Essential (primary) hypertension: Secondary | ICD-10-CM | POA: Diagnosis not present

## 2020-01-26 DIAGNOSIS — D631 Anemia in chronic kidney disease: Secondary | ICD-10-CM | POA: Diagnosis not present

## 2020-01-26 DIAGNOSIS — N2581 Secondary hyperparathyroidism of renal origin: Secondary | ICD-10-CM | POA: Diagnosis not present

## 2020-01-26 DIAGNOSIS — N186 End stage renal disease: Secondary | ICD-10-CM | POA: Diagnosis not present

## 2020-01-26 DIAGNOSIS — I10 Essential (primary) hypertension: Secondary | ICD-10-CM | POA: Diagnosis not present

## 2020-01-27 DIAGNOSIS — D631 Anemia in chronic kidney disease: Secondary | ICD-10-CM | POA: Diagnosis not present

## 2020-01-27 DIAGNOSIS — N186 End stage renal disease: Secondary | ICD-10-CM | POA: Diagnosis not present

## 2020-01-27 DIAGNOSIS — N2581 Secondary hyperparathyroidism of renal origin: Secondary | ICD-10-CM | POA: Diagnosis not present

## 2020-01-27 DIAGNOSIS — I10 Essential (primary) hypertension: Secondary | ICD-10-CM | POA: Diagnosis not present

## 2020-01-28 DIAGNOSIS — N2581 Secondary hyperparathyroidism of renal origin: Secondary | ICD-10-CM | POA: Diagnosis not present

## 2020-01-28 DIAGNOSIS — D631 Anemia in chronic kidney disease: Secondary | ICD-10-CM | POA: Diagnosis not present

## 2020-01-28 DIAGNOSIS — I10 Essential (primary) hypertension: Secondary | ICD-10-CM | POA: Diagnosis not present

## 2020-01-28 DIAGNOSIS — N186 End stage renal disease: Secondary | ICD-10-CM | POA: Diagnosis not present

## 2020-01-29 DIAGNOSIS — N186 End stage renal disease: Secondary | ICD-10-CM | POA: Diagnosis not present

## 2020-01-29 DIAGNOSIS — N2581 Secondary hyperparathyroidism of renal origin: Secondary | ICD-10-CM | POA: Diagnosis not present

## 2020-01-29 DIAGNOSIS — D631 Anemia in chronic kidney disease: Secondary | ICD-10-CM | POA: Diagnosis not present

## 2020-01-29 DIAGNOSIS — I10 Essential (primary) hypertension: Secondary | ICD-10-CM | POA: Diagnosis not present

## 2020-01-29 DIAGNOSIS — Z992 Dependence on renal dialysis: Secondary | ICD-10-CM | POA: Diagnosis not present

## 2020-01-30 DIAGNOSIS — N186 End stage renal disease: Secondary | ICD-10-CM | POA: Diagnosis not present

## 2020-01-30 DIAGNOSIS — N2581 Secondary hyperparathyroidism of renal origin: Secondary | ICD-10-CM | POA: Diagnosis not present

## 2020-01-30 DIAGNOSIS — Z20828 Contact with and (suspected) exposure to other viral communicable diseases: Secondary | ICD-10-CM | POA: Diagnosis not present

## 2020-01-30 DIAGNOSIS — D509 Iron deficiency anemia, unspecified: Secondary | ICD-10-CM | POA: Diagnosis not present

## 2020-01-30 DIAGNOSIS — D631 Anemia in chronic kidney disease: Secondary | ICD-10-CM | POA: Diagnosis not present

## 2020-01-31 DIAGNOSIS — N186 End stage renal disease: Secondary | ICD-10-CM | POA: Diagnosis not present

## 2020-01-31 DIAGNOSIS — N2581 Secondary hyperparathyroidism of renal origin: Secondary | ICD-10-CM | POA: Diagnosis not present

## 2020-01-31 DIAGNOSIS — D509 Iron deficiency anemia, unspecified: Secondary | ICD-10-CM | POA: Diagnosis not present

## 2020-01-31 DIAGNOSIS — D631 Anemia in chronic kidney disease: Secondary | ICD-10-CM | POA: Diagnosis not present

## 2020-01-31 DIAGNOSIS — Z20828 Contact with and (suspected) exposure to other viral communicable diseases: Secondary | ICD-10-CM | POA: Diagnosis not present

## 2020-02-01 DIAGNOSIS — D509 Iron deficiency anemia, unspecified: Secondary | ICD-10-CM | POA: Diagnosis not present

## 2020-02-01 DIAGNOSIS — D631 Anemia in chronic kidney disease: Secondary | ICD-10-CM | POA: Diagnosis not present

## 2020-02-01 DIAGNOSIS — N2581 Secondary hyperparathyroidism of renal origin: Secondary | ICD-10-CM | POA: Diagnosis not present

## 2020-02-01 DIAGNOSIS — N186 End stage renal disease: Secondary | ICD-10-CM | POA: Diagnosis not present

## 2020-02-01 DIAGNOSIS — Z20828 Contact with and (suspected) exposure to other viral communicable diseases: Secondary | ICD-10-CM | POA: Diagnosis not present

## 2020-02-02 DIAGNOSIS — Z20828 Contact with and (suspected) exposure to other viral communicable diseases: Secondary | ICD-10-CM | POA: Diagnosis not present

## 2020-02-02 DIAGNOSIS — N2581 Secondary hyperparathyroidism of renal origin: Secondary | ICD-10-CM | POA: Diagnosis not present

## 2020-02-02 DIAGNOSIS — N186 End stage renal disease: Secondary | ICD-10-CM | POA: Diagnosis not present

## 2020-02-02 DIAGNOSIS — D509 Iron deficiency anemia, unspecified: Secondary | ICD-10-CM | POA: Diagnosis not present

## 2020-02-02 DIAGNOSIS — D631 Anemia in chronic kidney disease: Secondary | ICD-10-CM | POA: Diagnosis not present

## 2020-02-04 DIAGNOSIS — N2581 Secondary hyperparathyroidism of renal origin: Secondary | ICD-10-CM | POA: Diagnosis not present

## 2020-02-04 DIAGNOSIS — N186 End stage renal disease: Secondary | ICD-10-CM | POA: Diagnosis not present

## 2020-02-04 DIAGNOSIS — D509 Iron deficiency anemia, unspecified: Secondary | ICD-10-CM | POA: Diagnosis not present

## 2020-02-04 DIAGNOSIS — D631 Anemia in chronic kidney disease: Secondary | ICD-10-CM | POA: Diagnosis not present

## 2020-02-04 DIAGNOSIS — Z20828 Contact with and (suspected) exposure to other viral communicable diseases: Secondary | ICD-10-CM | POA: Diagnosis not present

## 2020-02-05 DIAGNOSIS — Z20828 Contact with and (suspected) exposure to other viral communicable diseases: Secondary | ICD-10-CM | POA: Diagnosis not present

## 2020-02-05 DIAGNOSIS — N186 End stage renal disease: Secondary | ICD-10-CM | POA: Diagnosis not present

## 2020-02-05 DIAGNOSIS — N2581 Secondary hyperparathyroidism of renal origin: Secondary | ICD-10-CM | POA: Diagnosis not present

## 2020-02-05 DIAGNOSIS — D509 Iron deficiency anemia, unspecified: Secondary | ICD-10-CM | POA: Diagnosis not present

## 2020-02-05 DIAGNOSIS — D631 Anemia in chronic kidney disease: Secondary | ICD-10-CM | POA: Diagnosis not present

## 2020-02-06 DIAGNOSIS — D631 Anemia in chronic kidney disease: Secondary | ICD-10-CM | POA: Diagnosis not present

## 2020-02-06 DIAGNOSIS — N2581 Secondary hyperparathyroidism of renal origin: Secondary | ICD-10-CM | POA: Diagnosis not present

## 2020-02-06 DIAGNOSIS — D509 Iron deficiency anemia, unspecified: Secondary | ICD-10-CM | POA: Diagnosis not present

## 2020-02-06 DIAGNOSIS — Z20828 Contact with and (suspected) exposure to other viral communicable diseases: Secondary | ICD-10-CM | POA: Diagnosis not present

## 2020-02-06 DIAGNOSIS — N186 End stage renal disease: Secondary | ICD-10-CM | POA: Diagnosis not present

## 2020-02-07 DIAGNOSIS — D509 Iron deficiency anemia, unspecified: Secondary | ICD-10-CM | POA: Diagnosis not present

## 2020-02-07 DIAGNOSIS — N2581 Secondary hyperparathyroidism of renal origin: Secondary | ICD-10-CM | POA: Diagnosis not present

## 2020-02-07 DIAGNOSIS — Z20828 Contact with and (suspected) exposure to other viral communicable diseases: Secondary | ICD-10-CM | POA: Diagnosis not present

## 2020-02-07 DIAGNOSIS — D631 Anemia in chronic kidney disease: Secondary | ICD-10-CM | POA: Diagnosis not present

## 2020-02-07 DIAGNOSIS — N186 End stage renal disease: Secondary | ICD-10-CM | POA: Diagnosis not present

## 2020-02-09 DIAGNOSIS — D631 Anemia in chronic kidney disease: Secondary | ICD-10-CM | POA: Diagnosis not present

## 2020-02-09 DIAGNOSIS — N186 End stage renal disease: Secondary | ICD-10-CM | POA: Diagnosis not present

## 2020-02-09 DIAGNOSIS — D509 Iron deficiency anemia, unspecified: Secondary | ICD-10-CM | POA: Diagnosis not present

## 2020-02-09 DIAGNOSIS — N2581 Secondary hyperparathyroidism of renal origin: Secondary | ICD-10-CM | POA: Diagnosis not present

## 2020-02-09 DIAGNOSIS — Z20828 Contact with and (suspected) exposure to other viral communicable diseases: Secondary | ICD-10-CM | POA: Diagnosis not present

## 2020-02-10 DIAGNOSIS — D509 Iron deficiency anemia, unspecified: Secondary | ICD-10-CM | POA: Diagnosis not present

## 2020-02-10 DIAGNOSIS — D631 Anemia in chronic kidney disease: Secondary | ICD-10-CM | POA: Diagnosis not present

## 2020-02-10 DIAGNOSIS — N186 End stage renal disease: Secondary | ICD-10-CM | POA: Diagnosis not present

## 2020-02-10 DIAGNOSIS — N2581 Secondary hyperparathyroidism of renal origin: Secondary | ICD-10-CM | POA: Diagnosis not present

## 2020-02-10 DIAGNOSIS — Z20828 Contact with and (suspected) exposure to other viral communicable diseases: Secondary | ICD-10-CM | POA: Diagnosis not present

## 2020-02-14 DIAGNOSIS — N2581 Secondary hyperparathyroidism of renal origin: Secondary | ICD-10-CM | POA: Diagnosis not present

## 2020-02-14 DIAGNOSIS — N186 End stage renal disease: Secondary | ICD-10-CM | POA: Diagnosis not present

## 2020-02-14 DIAGNOSIS — D631 Anemia in chronic kidney disease: Secondary | ICD-10-CM | POA: Diagnosis not present

## 2020-02-14 DIAGNOSIS — D509 Iron deficiency anemia, unspecified: Secondary | ICD-10-CM | POA: Diagnosis not present

## 2020-02-14 DIAGNOSIS — Z20828 Contact with and (suspected) exposure to other viral communicable diseases: Secondary | ICD-10-CM | POA: Diagnosis not present

## 2020-02-16 DIAGNOSIS — D509 Iron deficiency anemia, unspecified: Secondary | ICD-10-CM | POA: Diagnosis not present

## 2020-02-16 DIAGNOSIS — N2581 Secondary hyperparathyroidism of renal origin: Secondary | ICD-10-CM | POA: Diagnosis not present

## 2020-02-16 DIAGNOSIS — N186 End stage renal disease: Secondary | ICD-10-CM | POA: Diagnosis not present

## 2020-02-16 DIAGNOSIS — Z20828 Contact with and (suspected) exposure to other viral communicable diseases: Secondary | ICD-10-CM | POA: Diagnosis not present

## 2020-02-16 DIAGNOSIS — D631 Anemia in chronic kidney disease: Secondary | ICD-10-CM | POA: Diagnosis not present

## 2020-02-18 DIAGNOSIS — D631 Anemia in chronic kidney disease: Secondary | ICD-10-CM | POA: Diagnosis not present

## 2020-02-18 DIAGNOSIS — N186 End stage renal disease: Secondary | ICD-10-CM | POA: Diagnosis not present

## 2020-02-18 DIAGNOSIS — Z20828 Contact with and (suspected) exposure to other viral communicable diseases: Secondary | ICD-10-CM | POA: Diagnosis not present

## 2020-02-18 DIAGNOSIS — N2581 Secondary hyperparathyroidism of renal origin: Secondary | ICD-10-CM | POA: Diagnosis not present

## 2020-02-18 DIAGNOSIS — D509 Iron deficiency anemia, unspecified: Secondary | ICD-10-CM | POA: Diagnosis not present

## 2020-02-19 DIAGNOSIS — D509 Iron deficiency anemia, unspecified: Secondary | ICD-10-CM | POA: Diagnosis not present

## 2020-02-19 DIAGNOSIS — N2581 Secondary hyperparathyroidism of renal origin: Secondary | ICD-10-CM | POA: Diagnosis not present

## 2020-02-19 DIAGNOSIS — Z20828 Contact with and (suspected) exposure to other viral communicable diseases: Secondary | ICD-10-CM | POA: Diagnosis not present

## 2020-02-19 DIAGNOSIS — D631 Anemia in chronic kidney disease: Secondary | ICD-10-CM | POA: Diagnosis not present

## 2020-02-19 DIAGNOSIS — N186 End stage renal disease: Secondary | ICD-10-CM | POA: Diagnosis not present

## 2020-02-20 DIAGNOSIS — N2581 Secondary hyperparathyroidism of renal origin: Secondary | ICD-10-CM | POA: Diagnosis not present

## 2020-02-20 DIAGNOSIS — Z20828 Contact with and (suspected) exposure to other viral communicable diseases: Secondary | ICD-10-CM | POA: Diagnosis not present

## 2020-02-20 DIAGNOSIS — D509 Iron deficiency anemia, unspecified: Secondary | ICD-10-CM | POA: Diagnosis not present

## 2020-02-20 DIAGNOSIS — D631 Anemia in chronic kidney disease: Secondary | ICD-10-CM | POA: Diagnosis not present

## 2020-02-20 DIAGNOSIS — N186 End stage renal disease: Secondary | ICD-10-CM | POA: Diagnosis not present

## 2020-02-21 DIAGNOSIS — D509 Iron deficiency anemia, unspecified: Secondary | ICD-10-CM | POA: Diagnosis not present

## 2020-02-21 DIAGNOSIS — D631 Anemia in chronic kidney disease: Secondary | ICD-10-CM | POA: Diagnosis not present

## 2020-02-21 DIAGNOSIS — Z20828 Contact with and (suspected) exposure to other viral communicable diseases: Secondary | ICD-10-CM | POA: Diagnosis not present

## 2020-02-21 DIAGNOSIS — N186 End stage renal disease: Secondary | ICD-10-CM | POA: Diagnosis not present

## 2020-02-21 DIAGNOSIS — N2581 Secondary hyperparathyroidism of renal origin: Secondary | ICD-10-CM | POA: Diagnosis not present

## 2020-02-22 DIAGNOSIS — D509 Iron deficiency anemia, unspecified: Secondary | ICD-10-CM | POA: Diagnosis not present

## 2020-02-22 DIAGNOSIS — D631 Anemia in chronic kidney disease: Secondary | ICD-10-CM | POA: Diagnosis not present

## 2020-02-22 DIAGNOSIS — N2581 Secondary hyperparathyroidism of renal origin: Secondary | ICD-10-CM | POA: Diagnosis not present

## 2020-02-22 DIAGNOSIS — N186 End stage renal disease: Secondary | ICD-10-CM | POA: Diagnosis not present

## 2020-02-22 DIAGNOSIS — Z20828 Contact with and (suspected) exposure to other viral communicable diseases: Secondary | ICD-10-CM | POA: Diagnosis not present

## 2020-02-23 DIAGNOSIS — D631 Anemia in chronic kidney disease: Secondary | ICD-10-CM | POA: Diagnosis not present

## 2020-02-23 DIAGNOSIS — N186 End stage renal disease: Secondary | ICD-10-CM | POA: Diagnosis not present

## 2020-02-23 DIAGNOSIS — Z20828 Contact with and (suspected) exposure to other viral communicable diseases: Secondary | ICD-10-CM | POA: Diagnosis not present

## 2020-02-23 DIAGNOSIS — D509 Iron deficiency anemia, unspecified: Secondary | ICD-10-CM | POA: Diagnosis not present

## 2020-02-23 DIAGNOSIS — N2581 Secondary hyperparathyroidism of renal origin: Secondary | ICD-10-CM | POA: Diagnosis not present

## 2020-02-24 DIAGNOSIS — N186 End stage renal disease: Secondary | ICD-10-CM | POA: Diagnosis not present

## 2020-02-24 DIAGNOSIS — Z20828 Contact with and (suspected) exposure to other viral communicable diseases: Secondary | ICD-10-CM | POA: Diagnosis not present

## 2020-02-24 DIAGNOSIS — D509 Iron deficiency anemia, unspecified: Secondary | ICD-10-CM | POA: Diagnosis not present

## 2020-02-24 DIAGNOSIS — N2581 Secondary hyperparathyroidism of renal origin: Secondary | ICD-10-CM | POA: Diagnosis not present

## 2020-02-24 DIAGNOSIS — D631 Anemia in chronic kidney disease: Secondary | ICD-10-CM | POA: Diagnosis not present

## 2020-02-25 DIAGNOSIS — N2581 Secondary hyperparathyroidism of renal origin: Secondary | ICD-10-CM | POA: Diagnosis not present

## 2020-02-25 DIAGNOSIS — N186 End stage renal disease: Secondary | ICD-10-CM | POA: Diagnosis not present

## 2020-02-25 DIAGNOSIS — D631 Anemia in chronic kidney disease: Secondary | ICD-10-CM | POA: Diagnosis not present

## 2020-02-25 DIAGNOSIS — Z20828 Contact with and (suspected) exposure to other viral communicable diseases: Secondary | ICD-10-CM | POA: Diagnosis not present

## 2020-02-25 DIAGNOSIS — D509 Iron deficiency anemia, unspecified: Secondary | ICD-10-CM | POA: Diagnosis not present

## 2020-02-26 DIAGNOSIS — N186 End stage renal disease: Secondary | ICD-10-CM | POA: Diagnosis not present

## 2020-02-26 DIAGNOSIS — N2581 Secondary hyperparathyroidism of renal origin: Secondary | ICD-10-CM | POA: Diagnosis not present

## 2020-02-26 DIAGNOSIS — Z20828 Contact with and (suspected) exposure to other viral communicable diseases: Secondary | ICD-10-CM | POA: Diagnosis not present

## 2020-02-26 DIAGNOSIS — D509 Iron deficiency anemia, unspecified: Secondary | ICD-10-CM | POA: Diagnosis not present

## 2020-02-26 DIAGNOSIS — D631 Anemia in chronic kidney disease: Secondary | ICD-10-CM | POA: Diagnosis not present

## 2020-02-27 DIAGNOSIS — Z20828 Contact with and (suspected) exposure to other viral communicable diseases: Secondary | ICD-10-CM | POA: Diagnosis not present

## 2020-02-27 DIAGNOSIS — D631 Anemia in chronic kidney disease: Secondary | ICD-10-CM | POA: Diagnosis not present

## 2020-02-27 DIAGNOSIS — N186 End stage renal disease: Secondary | ICD-10-CM | POA: Diagnosis not present

## 2020-02-27 DIAGNOSIS — N2581 Secondary hyperparathyroidism of renal origin: Secondary | ICD-10-CM | POA: Diagnosis not present

## 2020-02-27 DIAGNOSIS — E119 Type 2 diabetes mellitus without complications: Secondary | ICD-10-CM | POA: Diagnosis not present

## 2020-02-27 DIAGNOSIS — D509 Iron deficiency anemia, unspecified: Secondary | ICD-10-CM | POA: Diagnosis not present

## 2020-02-29 DIAGNOSIS — Z992 Dependence on renal dialysis: Secondary | ICD-10-CM | POA: Diagnosis not present

## 2020-02-29 DIAGNOSIS — Z20828 Contact with and (suspected) exposure to other viral communicable diseases: Secondary | ICD-10-CM | POA: Diagnosis not present

## 2020-02-29 DIAGNOSIS — N2581 Secondary hyperparathyroidism of renal origin: Secondary | ICD-10-CM | POA: Diagnosis not present

## 2020-02-29 DIAGNOSIS — D631 Anemia in chronic kidney disease: Secondary | ICD-10-CM | POA: Diagnosis not present

## 2020-02-29 DIAGNOSIS — D509 Iron deficiency anemia, unspecified: Secondary | ICD-10-CM | POA: Diagnosis not present

## 2020-02-29 DIAGNOSIS — N186 End stage renal disease: Secondary | ICD-10-CM | POA: Diagnosis not present

## 2020-03-01 DIAGNOSIS — N186 End stage renal disease: Secondary | ICD-10-CM | POA: Diagnosis not present

## 2020-03-01 DIAGNOSIS — Z992 Dependence on renal dialysis: Secondary | ICD-10-CM | POA: Diagnosis not present

## 2020-03-02 DIAGNOSIS — N186 End stage renal disease: Secondary | ICD-10-CM | POA: Diagnosis not present

## 2020-03-02 DIAGNOSIS — D631 Anemia in chronic kidney disease: Secondary | ICD-10-CM | POA: Diagnosis not present

## 2020-03-02 DIAGNOSIS — N2581 Secondary hyperparathyroidism of renal origin: Secondary | ICD-10-CM | POA: Diagnosis not present

## 2020-03-02 DIAGNOSIS — Z992 Dependence on renal dialysis: Secondary | ICD-10-CM | POA: Diagnosis not present

## 2020-03-03 DIAGNOSIS — N2581 Secondary hyperparathyroidism of renal origin: Secondary | ICD-10-CM | POA: Diagnosis not present

## 2020-03-03 DIAGNOSIS — Z992 Dependence on renal dialysis: Secondary | ICD-10-CM | POA: Diagnosis not present

## 2020-03-03 DIAGNOSIS — N186 End stage renal disease: Secondary | ICD-10-CM | POA: Diagnosis not present

## 2020-03-03 DIAGNOSIS — D631 Anemia in chronic kidney disease: Secondary | ICD-10-CM | POA: Diagnosis not present

## 2020-03-04 DIAGNOSIS — N186 End stage renal disease: Secondary | ICD-10-CM | POA: Diagnosis not present

## 2020-03-04 DIAGNOSIS — Z992 Dependence on renal dialysis: Secondary | ICD-10-CM | POA: Diagnosis not present

## 2020-03-05 DIAGNOSIS — Z992 Dependence on renal dialysis: Secondary | ICD-10-CM | POA: Diagnosis not present

## 2020-03-05 DIAGNOSIS — N2581 Secondary hyperparathyroidism of renal origin: Secondary | ICD-10-CM | POA: Diagnosis not present

## 2020-03-05 DIAGNOSIS — N186 End stage renal disease: Secondary | ICD-10-CM | POA: Diagnosis not present

## 2020-03-05 DIAGNOSIS — D631 Anemia in chronic kidney disease: Secondary | ICD-10-CM | POA: Diagnosis not present

## 2020-03-06 DIAGNOSIS — Z992 Dependence on renal dialysis: Secondary | ICD-10-CM | POA: Diagnosis not present

## 2020-03-06 DIAGNOSIS — N186 End stage renal disease: Secondary | ICD-10-CM | POA: Diagnosis not present

## 2020-03-07 DIAGNOSIS — N2581 Secondary hyperparathyroidism of renal origin: Secondary | ICD-10-CM | POA: Diagnosis not present

## 2020-03-07 DIAGNOSIS — N186 End stage renal disease: Secondary | ICD-10-CM | POA: Diagnosis not present

## 2020-03-07 DIAGNOSIS — D631 Anemia in chronic kidney disease: Secondary | ICD-10-CM | POA: Diagnosis not present

## 2020-03-07 DIAGNOSIS — Z992 Dependence on renal dialysis: Secondary | ICD-10-CM | POA: Diagnosis not present

## 2020-03-08 DIAGNOSIS — Z992 Dependence on renal dialysis: Secondary | ICD-10-CM | POA: Diagnosis not present

## 2020-03-08 DIAGNOSIS — N186 End stage renal disease: Secondary | ICD-10-CM | POA: Diagnosis not present

## 2020-03-09 DIAGNOSIS — Z992 Dependence on renal dialysis: Secondary | ICD-10-CM | POA: Diagnosis not present

## 2020-03-09 DIAGNOSIS — N186 End stage renal disease: Secondary | ICD-10-CM | POA: Diagnosis not present

## 2020-03-09 DIAGNOSIS — N2581 Secondary hyperparathyroidism of renal origin: Secondary | ICD-10-CM | POA: Diagnosis not present

## 2020-03-09 DIAGNOSIS — D631 Anemia in chronic kidney disease: Secondary | ICD-10-CM | POA: Diagnosis not present

## 2020-03-10 DIAGNOSIS — Z992 Dependence on renal dialysis: Secondary | ICD-10-CM | POA: Diagnosis not present

## 2020-03-10 DIAGNOSIS — N186 End stage renal disease: Secondary | ICD-10-CM | POA: Diagnosis not present

## 2020-03-11 DIAGNOSIS — N186 End stage renal disease: Secondary | ICD-10-CM | POA: Diagnosis not present

## 2020-03-11 DIAGNOSIS — Z992 Dependence on renal dialysis: Secondary | ICD-10-CM | POA: Diagnosis not present

## 2020-03-12 DIAGNOSIS — Z992 Dependence on renal dialysis: Secondary | ICD-10-CM | POA: Diagnosis not present

## 2020-03-12 DIAGNOSIS — N186 End stage renal disease: Secondary | ICD-10-CM | POA: Diagnosis not present

## 2020-03-13 DIAGNOSIS — N2581 Secondary hyperparathyroidism of renal origin: Secondary | ICD-10-CM | POA: Diagnosis not present

## 2020-03-13 DIAGNOSIS — Z992 Dependence on renal dialysis: Secondary | ICD-10-CM | POA: Diagnosis not present

## 2020-03-13 DIAGNOSIS — N186 End stage renal disease: Secondary | ICD-10-CM | POA: Diagnosis not present

## 2020-03-13 DIAGNOSIS — D631 Anemia in chronic kidney disease: Secondary | ICD-10-CM | POA: Diagnosis not present

## 2020-03-14 DIAGNOSIS — N186 End stage renal disease: Secondary | ICD-10-CM | POA: Diagnosis not present

## 2020-03-14 DIAGNOSIS — Z992 Dependence on renal dialysis: Secondary | ICD-10-CM | POA: Diagnosis not present

## 2020-03-15 DIAGNOSIS — N2581 Secondary hyperparathyroidism of renal origin: Secondary | ICD-10-CM | POA: Diagnosis not present

## 2020-03-15 DIAGNOSIS — Z992 Dependence on renal dialysis: Secondary | ICD-10-CM | POA: Diagnosis not present

## 2020-03-15 DIAGNOSIS — D631 Anemia in chronic kidney disease: Secondary | ICD-10-CM | POA: Diagnosis not present

## 2020-03-15 DIAGNOSIS — N186 End stage renal disease: Secondary | ICD-10-CM | POA: Diagnosis not present

## 2020-03-16 DIAGNOSIS — D631 Anemia in chronic kidney disease: Secondary | ICD-10-CM | POA: Diagnosis not present

## 2020-03-16 DIAGNOSIS — N2581 Secondary hyperparathyroidism of renal origin: Secondary | ICD-10-CM | POA: Diagnosis not present

## 2020-03-16 DIAGNOSIS — Z992 Dependence on renal dialysis: Secondary | ICD-10-CM | POA: Diagnosis not present

## 2020-03-16 DIAGNOSIS — N186 End stage renal disease: Secondary | ICD-10-CM | POA: Diagnosis not present

## 2020-03-17 DIAGNOSIS — Z992 Dependence on renal dialysis: Secondary | ICD-10-CM | POA: Diagnosis not present

## 2020-03-17 DIAGNOSIS — N186 End stage renal disease: Secondary | ICD-10-CM | POA: Diagnosis not present

## 2020-03-17 DIAGNOSIS — N2581 Secondary hyperparathyroidism of renal origin: Secondary | ICD-10-CM | POA: Diagnosis not present

## 2020-03-17 DIAGNOSIS — D631 Anemia in chronic kidney disease: Secondary | ICD-10-CM | POA: Diagnosis not present

## 2020-03-18 DIAGNOSIS — M542 Cervicalgia: Secondary | ICD-10-CM | POA: Diagnosis not present

## 2020-03-18 DIAGNOSIS — M898X0 Other specified disorders of bone, multiple sites: Secondary | ICD-10-CM | POA: Diagnosis not present

## 2020-03-18 DIAGNOSIS — N3289 Other specified disorders of bladder: Secondary | ICD-10-CM | POA: Diagnosis not present

## 2020-03-18 DIAGNOSIS — F17210 Nicotine dependence, cigarettes, uncomplicated: Secondary | ICD-10-CM | POA: Diagnosis present

## 2020-03-18 DIAGNOSIS — F419 Anxiety disorder, unspecified: Secondary | ICD-10-CM | POA: Diagnosis not present

## 2020-03-18 DIAGNOSIS — R509 Fever, unspecified: Secondary | ICD-10-CM | POA: Diagnosis not present

## 2020-03-18 DIAGNOSIS — Z9114 Patient's other noncompliance with medication regimen: Secondary | ICD-10-CM | POA: Diagnosis not present

## 2020-03-18 DIAGNOSIS — N186 End stage renal disease: Secondary | ICD-10-CM | POA: Diagnosis present

## 2020-03-18 DIAGNOSIS — I5033 Acute on chronic diastolic (congestive) heart failure: Secondary | ICD-10-CM | POA: Diagnosis present

## 2020-03-18 DIAGNOSIS — R079 Chest pain, unspecified: Secondary | ICD-10-CM | POA: Diagnosis not present

## 2020-03-18 DIAGNOSIS — I120 Hypertensive chronic kidney disease with stage 5 chronic kidney disease or end stage renal disease: Secondary | ICD-10-CM | POA: Diagnosis not present

## 2020-03-18 DIAGNOSIS — N2581 Secondary hyperparathyroidism of renal origin: Secondary | ICD-10-CM | POA: Diagnosis present

## 2020-03-18 DIAGNOSIS — Z79899 Other long term (current) drug therapy: Secondary | ICD-10-CM | POA: Diagnosis not present

## 2020-03-18 DIAGNOSIS — N250 Renal osteodystrophy: Secondary | ICD-10-CM | POA: Diagnosis present

## 2020-03-18 DIAGNOSIS — I420 Dilated cardiomyopathy: Secondary | ICD-10-CM | POA: Diagnosis present

## 2020-03-18 DIAGNOSIS — I5023 Acute on chronic systolic (congestive) heart failure: Secondary | ICD-10-CM | POA: Diagnosis not present

## 2020-03-18 DIAGNOSIS — R059 Cough, unspecified: Secondary | ICD-10-CM | POA: Diagnosis not present

## 2020-03-18 DIAGNOSIS — G8929 Other chronic pain: Secondary | ICD-10-CM | POA: Diagnosis present

## 2020-03-18 DIAGNOSIS — Z20822 Contact with and (suspected) exposure to covid-19: Secondary | ICD-10-CM | POA: Diagnosis present

## 2020-03-18 DIAGNOSIS — R058 Other specified cough: Secondary | ICD-10-CM | POA: Diagnosis not present

## 2020-03-18 DIAGNOSIS — R1032 Left lower quadrant pain: Secondary | ICD-10-CM | POA: Diagnosis not present

## 2020-03-18 DIAGNOSIS — I428 Other cardiomyopathies: Secondary | ICD-10-CM | POA: Diagnosis not present

## 2020-03-18 DIAGNOSIS — R188 Other ascites: Secondary | ICD-10-CM | POA: Diagnosis not present

## 2020-03-18 DIAGNOSIS — Z8659 Personal history of other mental and behavioral disorders: Secondary | ICD-10-CM | POA: Diagnosis not present

## 2020-03-18 DIAGNOSIS — R000 Tachycardia, unspecified: Secondary | ICD-10-CM | POA: Diagnosis not present

## 2020-03-18 DIAGNOSIS — N028 Recurrent and persistent hematuria with other morphologic changes: Secondary | ICD-10-CM | POA: Diagnosis present

## 2020-03-18 DIAGNOSIS — R0789 Other chest pain: Secondary | ICD-10-CM | POA: Diagnosis not present

## 2020-03-18 DIAGNOSIS — M545 Low back pain, unspecified: Secondary | ICD-10-CM | POA: Diagnosis not present

## 2020-03-18 DIAGNOSIS — I5020 Unspecified systolic (congestive) heart failure: Secondary | ICD-10-CM | POA: Diagnosis not present

## 2020-03-18 DIAGNOSIS — B974 Respiratory syncytial virus as the cause of diseases classified elsewhere: Secondary | ICD-10-CM | POA: Diagnosis present

## 2020-03-18 DIAGNOSIS — I132 Hypertensive heart and chronic kidney disease with heart failure and with stage 5 chronic kidney disease, or end stage renal disease: Secondary | ICD-10-CM | POA: Diagnosis present

## 2020-03-18 DIAGNOSIS — Z992 Dependence on renal dialysis: Secondary | ICD-10-CM | POA: Diagnosis not present

## 2020-03-18 DIAGNOSIS — D631 Anemia in chronic kidney disease: Secondary | ICD-10-CM | POA: Diagnosis not present

## 2020-03-18 DIAGNOSIS — M8588 Other specified disorders of bone density and structure, other site: Secondary | ICD-10-CM | POA: Diagnosis not present

## 2020-03-18 DIAGNOSIS — M546 Pain in thoracic spine: Secondary | ICD-10-CM | POA: Diagnosis not present

## 2020-03-18 DIAGNOSIS — R9431 Abnormal electrocardiogram [ECG] [EKG]: Secondary | ICD-10-CM | POA: Diagnosis not present

## 2020-03-18 DIAGNOSIS — N189 Chronic kidney disease, unspecified: Secondary | ICD-10-CM | POA: Diagnosis not present

## 2020-03-18 DIAGNOSIS — R109 Unspecified abdominal pain: Secondary | ICD-10-CM | POA: Diagnosis not present

## 2020-03-18 DIAGNOSIS — R0602 Shortness of breath: Secondary | ICD-10-CM | POA: Diagnosis not present

## 2020-03-22 DIAGNOSIS — D631 Anemia in chronic kidney disease: Secondary | ICD-10-CM | POA: Diagnosis not present

## 2020-03-22 DIAGNOSIS — N2581 Secondary hyperparathyroidism of renal origin: Secondary | ICD-10-CM | POA: Diagnosis not present

## 2020-03-22 DIAGNOSIS — N186 End stage renal disease: Secondary | ICD-10-CM | POA: Diagnosis not present

## 2020-03-23 DIAGNOSIS — Z992 Dependence on renal dialysis: Secondary | ICD-10-CM | POA: Diagnosis not present

## 2020-03-23 DIAGNOSIS — N186 End stage renal disease: Secondary | ICD-10-CM | POA: Diagnosis not present

## 2020-03-24 DIAGNOSIS — N186 End stage renal disease: Secondary | ICD-10-CM | POA: Diagnosis not present

## 2020-03-24 DIAGNOSIS — Z992 Dependence on renal dialysis: Secondary | ICD-10-CM | POA: Diagnosis not present

## 2020-03-25 DIAGNOSIS — N2581 Secondary hyperparathyroidism of renal origin: Secondary | ICD-10-CM | POA: Diagnosis not present

## 2020-03-25 DIAGNOSIS — Z992 Dependence on renal dialysis: Secondary | ICD-10-CM | POA: Diagnosis not present

## 2020-03-25 DIAGNOSIS — N186 End stage renal disease: Secondary | ICD-10-CM | POA: Diagnosis not present

## 2020-03-25 DIAGNOSIS — D631 Anemia in chronic kidney disease: Secondary | ICD-10-CM | POA: Diagnosis not present

## 2020-03-26 DIAGNOSIS — D631 Anemia in chronic kidney disease: Secondary | ICD-10-CM | POA: Diagnosis not present

## 2020-03-26 DIAGNOSIS — E119 Type 2 diabetes mellitus without complications: Secondary | ICD-10-CM | POA: Diagnosis not present

## 2020-03-26 DIAGNOSIS — N186 End stage renal disease: Secondary | ICD-10-CM | POA: Diagnosis not present

## 2020-03-26 DIAGNOSIS — Z992 Dependence on renal dialysis: Secondary | ICD-10-CM | POA: Diagnosis not present

## 2020-03-26 DIAGNOSIS — N2581 Secondary hyperparathyroidism of renal origin: Secondary | ICD-10-CM | POA: Diagnosis not present

## 2020-03-27 DIAGNOSIS — D631 Anemia in chronic kidney disease: Secondary | ICD-10-CM | POA: Diagnosis not present

## 2020-03-27 DIAGNOSIS — N186 End stage renal disease: Secondary | ICD-10-CM | POA: Diagnosis not present

## 2020-03-27 DIAGNOSIS — N2581 Secondary hyperparathyroidism of renal origin: Secondary | ICD-10-CM | POA: Diagnosis not present

## 2020-03-27 DIAGNOSIS — Z992 Dependence on renal dialysis: Secondary | ICD-10-CM | POA: Diagnosis not present

## 2020-03-28 DIAGNOSIS — I400 Infective myocarditis: Secondary | ICD-10-CM | POA: Diagnosis not present

## 2020-03-28 DIAGNOSIS — N186 End stage renal disease: Secondary | ICD-10-CM | POA: Diagnosis not present

## 2020-03-28 DIAGNOSIS — Z992 Dependence on renal dialysis: Secondary | ICD-10-CM | POA: Diagnosis not present

## 2020-03-28 DIAGNOSIS — I420 Dilated cardiomyopathy: Secondary | ICD-10-CM | POA: Insufficient documentation

## 2020-03-29 DIAGNOSIS — Z992 Dependence on renal dialysis: Secondary | ICD-10-CM | POA: Diagnosis not present

## 2020-03-29 DIAGNOSIS — N186 End stage renal disease: Secondary | ICD-10-CM | POA: Diagnosis not present

## 2020-03-29 DIAGNOSIS — N2581 Secondary hyperparathyroidism of renal origin: Secondary | ICD-10-CM | POA: Diagnosis not present

## 2020-03-29 DIAGNOSIS — D631 Anemia in chronic kidney disease: Secondary | ICD-10-CM | POA: Diagnosis not present

## 2020-03-30 DIAGNOSIS — Z992 Dependence on renal dialysis: Secondary | ICD-10-CM | POA: Diagnosis not present

## 2020-03-30 DIAGNOSIS — N186 End stage renal disease: Secondary | ICD-10-CM | POA: Diagnosis not present

## 2020-03-31 DIAGNOSIS — D631 Anemia in chronic kidney disease: Secondary | ICD-10-CM | POA: Diagnosis not present

## 2020-03-31 DIAGNOSIS — N2581 Secondary hyperparathyroidism of renal origin: Secondary | ICD-10-CM | POA: Diagnosis not present

## 2020-03-31 DIAGNOSIS — Z992 Dependence on renal dialysis: Secondary | ICD-10-CM | POA: Diagnosis not present

## 2020-03-31 DIAGNOSIS — N186 End stage renal disease: Secondary | ICD-10-CM | POA: Diagnosis not present

## 2020-04-01 DIAGNOSIS — N186 End stage renal disease: Secondary | ICD-10-CM | POA: Diagnosis not present

## 2020-04-01 DIAGNOSIS — Z418 Encounter for other procedures for purposes other than remedying health state: Secondary | ICD-10-CM | POA: Diagnosis not present

## 2020-04-01 DIAGNOSIS — D631 Anemia in chronic kidney disease: Secondary | ICD-10-CM | POA: Diagnosis not present

## 2020-04-01 DIAGNOSIS — N2581 Secondary hyperparathyroidism of renal origin: Secondary | ICD-10-CM | POA: Diagnosis not present

## 2020-04-02 DIAGNOSIS — N186 End stage renal disease: Secondary | ICD-10-CM | POA: Diagnosis not present

## 2020-04-02 DIAGNOSIS — D631 Anemia in chronic kidney disease: Secondary | ICD-10-CM | POA: Diagnosis not present

## 2020-04-02 DIAGNOSIS — N2581 Secondary hyperparathyroidism of renal origin: Secondary | ICD-10-CM | POA: Diagnosis not present

## 2020-04-02 DIAGNOSIS — Z418 Encounter for other procedures for purposes other than remedying health state: Secondary | ICD-10-CM | POA: Diagnosis not present

## 2020-04-03 DIAGNOSIS — E119 Type 2 diabetes mellitus without complications: Secondary | ICD-10-CM | POA: Diagnosis not present

## 2020-04-03 DIAGNOSIS — D631 Anemia in chronic kidney disease: Secondary | ICD-10-CM | POA: Diagnosis not present

## 2020-04-03 DIAGNOSIS — Z418 Encounter for other procedures for purposes other than remedying health state: Secondary | ICD-10-CM | POA: Diagnosis not present

## 2020-04-03 DIAGNOSIS — N186 End stage renal disease: Secondary | ICD-10-CM | POA: Diagnosis not present

## 2020-04-03 DIAGNOSIS — N2581 Secondary hyperparathyroidism of renal origin: Secondary | ICD-10-CM | POA: Diagnosis not present

## 2020-04-05 DIAGNOSIS — N186 End stage renal disease: Secondary | ICD-10-CM | POA: Diagnosis not present

## 2020-04-05 DIAGNOSIS — N2581 Secondary hyperparathyroidism of renal origin: Secondary | ICD-10-CM | POA: Diagnosis not present

## 2020-04-05 DIAGNOSIS — Z418 Encounter for other procedures for purposes other than remedying health state: Secondary | ICD-10-CM | POA: Diagnosis not present

## 2020-04-05 DIAGNOSIS — D631 Anemia in chronic kidney disease: Secondary | ICD-10-CM | POA: Diagnosis not present

## 2020-04-07 DIAGNOSIS — Z418 Encounter for other procedures for purposes other than remedying health state: Secondary | ICD-10-CM | POA: Diagnosis not present

## 2020-04-07 DIAGNOSIS — N186 End stage renal disease: Secondary | ICD-10-CM | POA: Diagnosis not present

## 2020-04-07 DIAGNOSIS — N2581 Secondary hyperparathyroidism of renal origin: Secondary | ICD-10-CM | POA: Diagnosis not present

## 2020-04-07 DIAGNOSIS — D631 Anemia in chronic kidney disease: Secondary | ICD-10-CM | POA: Diagnosis not present

## 2020-04-08 DIAGNOSIS — D631 Anemia in chronic kidney disease: Secondary | ICD-10-CM | POA: Diagnosis not present

## 2020-04-08 DIAGNOSIS — N186 End stage renal disease: Secondary | ICD-10-CM | POA: Diagnosis not present

## 2020-04-08 DIAGNOSIS — N2581 Secondary hyperparathyroidism of renal origin: Secondary | ICD-10-CM | POA: Diagnosis not present

## 2020-04-08 DIAGNOSIS — Z418 Encounter for other procedures for purposes other than remedying health state: Secondary | ICD-10-CM | POA: Diagnosis not present

## 2020-04-09 DIAGNOSIS — N2581 Secondary hyperparathyroidism of renal origin: Secondary | ICD-10-CM | POA: Diagnosis not present

## 2020-04-09 DIAGNOSIS — Z418 Encounter for other procedures for purposes other than remedying health state: Secondary | ICD-10-CM | POA: Diagnosis not present

## 2020-04-09 DIAGNOSIS — N186 End stage renal disease: Secondary | ICD-10-CM | POA: Diagnosis not present

## 2020-04-09 DIAGNOSIS — D631 Anemia in chronic kidney disease: Secondary | ICD-10-CM | POA: Diagnosis not present

## 2020-04-10 DIAGNOSIS — Z418 Encounter for other procedures for purposes other than remedying health state: Secondary | ICD-10-CM | POA: Diagnosis not present

## 2020-04-10 DIAGNOSIS — N186 End stage renal disease: Secondary | ICD-10-CM | POA: Diagnosis not present

## 2020-04-10 DIAGNOSIS — N2581 Secondary hyperparathyroidism of renal origin: Secondary | ICD-10-CM | POA: Diagnosis not present

## 2020-04-10 DIAGNOSIS — D631 Anemia in chronic kidney disease: Secondary | ICD-10-CM | POA: Diagnosis not present

## 2020-04-11 DIAGNOSIS — D631 Anemia in chronic kidney disease: Secondary | ICD-10-CM | POA: Diagnosis not present

## 2020-04-11 DIAGNOSIS — N2581 Secondary hyperparathyroidism of renal origin: Secondary | ICD-10-CM | POA: Diagnosis not present

## 2020-04-11 DIAGNOSIS — Z418 Encounter for other procedures for purposes other than remedying health state: Secondary | ICD-10-CM | POA: Diagnosis not present

## 2020-04-11 DIAGNOSIS — N186 End stage renal disease: Secondary | ICD-10-CM | POA: Diagnosis not present

## 2020-04-14 DIAGNOSIS — D631 Anemia in chronic kidney disease: Secondary | ICD-10-CM | POA: Diagnosis not present

## 2020-04-14 DIAGNOSIS — N186 End stage renal disease: Secondary | ICD-10-CM | POA: Diagnosis not present

## 2020-04-14 DIAGNOSIS — N2581 Secondary hyperparathyroidism of renal origin: Secondary | ICD-10-CM | POA: Diagnosis not present

## 2020-04-14 DIAGNOSIS — Z418 Encounter for other procedures for purposes other than remedying health state: Secondary | ICD-10-CM | POA: Diagnosis not present

## 2020-04-17 DIAGNOSIS — N2581 Secondary hyperparathyroidism of renal origin: Secondary | ICD-10-CM | POA: Diagnosis not present

## 2020-04-17 DIAGNOSIS — D631 Anemia in chronic kidney disease: Secondary | ICD-10-CM | POA: Diagnosis not present

## 2020-04-17 DIAGNOSIS — Z418 Encounter for other procedures for purposes other than remedying health state: Secondary | ICD-10-CM | POA: Diagnosis not present

## 2020-04-17 DIAGNOSIS — N186 End stage renal disease: Secondary | ICD-10-CM | POA: Diagnosis not present

## 2020-04-19 DIAGNOSIS — D631 Anemia in chronic kidney disease: Secondary | ICD-10-CM | POA: Diagnosis not present

## 2020-04-19 DIAGNOSIS — N2581 Secondary hyperparathyroidism of renal origin: Secondary | ICD-10-CM | POA: Diagnosis not present

## 2020-04-19 DIAGNOSIS — Z418 Encounter for other procedures for purposes other than remedying health state: Secondary | ICD-10-CM | POA: Diagnosis not present

## 2020-04-19 DIAGNOSIS — N186 End stage renal disease: Secondary | ICD-10-CM | POA: Diagnosis not present

## 2020-04-20 DIAGNOSIS — N2581 Secondary hyperparathyroidism of renal origin: Secondary | ICD-10-CM | POA: Diagnosis not present

## 2020-04-20 DIAGNOSIS — N186 End stage renal disease: Secondary | ICD-10-CM | POA: Diagnosis not present

## 2020-04-20 DIAGNOSIS — Z418 Encounter for other procedures for purposes other than remedying health state: Secondary | ICD-10-CM | POA: Diagnosis not present

## 2020-04-20 DIAGNOSIS — D631 Anemia in chronic kidney disease: Secondary | ICD-10-CM | POA: Diagnosis not present

## 2020-04-21 DIAGNOSIS — D631 Anemia in chronic kidney disease: Secondary | ICD-10-CM | POA: Diagnosis not present

## 2020-04-21 DIAGNOSIS — N186 End stage renal disease: Secondary | ICD-10-CM | POA: Diagnosis not present

## 2020-04-21 DIAGNOSIS — Z418 Encounter for other procedures for purposes other than remedying health state: Secondary | ICD-10-CM | POA: Diagnosis not present

## 2020-04-21 DIAGNOSIS — N2581 Secondary hyperparathyroidism of renal origin: Secondary | ICD-10-CM | POA: Diagnosis not present

## 2020-04-22 DIAGNOSIS — D631 Anemia in chronic kidney disease: Secondary | ICD-10-CM | POA: Diagnosis not present

## 2020-04-22 DIAGNOSIS — Z418 Encounter for other procedures for purposes other than remedying health state: Secondary | ICD-10-CM | POA: Diagnosis not present

## 2020-04-22 DIAGNOSIS — N186 End stage renal disease: Secondary | ICD-10-CM | POA: Diagnosis not present

## 2020-04-22 DIAGNOSIS — N2581 Secondary hyperparathyroidism of renal origin: Secondary | ICD-10-CM | POA: Diagnosis not present

## 2020-04-23 DIAGNOSIS — Z418 Encounter for other procedures for purposes other than remedying health state: Secondary | ICD-10-CM | POA: Diagnosis not present

## 2020-04-23 DIAGNOSIS — N186 End stage renal disease: Secondary | ICD-10-CM | POA: Diagnosis not present

## 2020-04-23 DIAGNOSIS — N2581 Secondary hyperparathyroidism of renal origin: Secondary | ICD-10-CM | POA: Diagnosis not present

## 2020-04-23 DIAGNOSIS — D631 Anemia in chronic kidney disease: Secondary | ICD-10-CM | POA: Diagnosis not present

## 2020-04-26 DIAGNOSIS — N186 End stage renal disease: Secondary | ICD-10-CM | POA: Diagnosis not present

## 2020-04-26 DIAGNOSIS — N2581 Secondary hyperparathyroidism of renal origin: Secondary | ICD-10-CM | POA: Diagnosis not present

## 2020-04-26 DIAGNOSIS — Z418 Encounter for other procedures for purposes other than remedying health state: Secondary | ICD-10-CM | POA: Diagnosis not present

## 2020-04-26 DIAGNOSIS — D631 Anemia in chronic kidney disease: Secondary | ICD-10-CM | POA: Diagnosis not present

## 2020-04-27 DIAGNOSIS — D631 Anemia in chronic kidney disease: Secondary | ICD-10-CM | POA: Diagnosis not present

## 2020-04-27 DIAGNOSIS — N2581 Secondary hyperparathyroidism of renal origin: Secondary | ICD-10-CM | POA: Diagnosis not present

## 2020-04-27 DIAGNOSIS — N186 End stage renal disease: Secondary | ICD-10-CM | POA: Diagnosis not present

## 2020-04-27 DIAGNOSIS — Z418 Encounter for other procedures for purposes other than remedying health state: Secondary | ICD-10-CM | POA: Diagnosis not present

## 2020-04-28 DIAGNOSIS — N186 End stage renal disease: Secondary | ICD-10-CM | POA: Diagnosis not present

## 2020-04-28 DIAGNOSIS — Z992 Dependence on renal dialysis: Secondary | ICD-10-CM | POA: Diagnosis not present

## 2020-04-29 DIAGNOSIS — D631 Anemia in chronic kidney disease: Secondary | ICD-10-CM | POA: Diagnosis not present

## 2020-04-29 DIAGNOSIS — N186 End stage renal disease: Secondary | ICD-10-CM | POA: Diagnosis not present

## 2020-04-29 DIAGNOSIS — N2581 Secondary hyperparathyroidism of renal origin: Secondary | ICD-10-CM | POA: Diagnosis not present

## 2020-04-30 DIAGNOSIS — N186 End stage renal disease: Secondary | ICD-10-CM | POA: Diagnosis not present

## 2020-04-30 DIAGNOSIS — N2581 Secondary hyperparathyroidism of renal origin: Secondary | ICD-10-CM | POA: Diagnosis not present

## 2020-04-30 DIAGNOSIS — R188 Other ascites: Secondary | ICD-10-CM | POA: Diagnosis not present

## 2020-04-30 DIAGNOSIS — D631 Anemia in chronic kidney disease: Secondary | ICD-10-CM | POA: Diagnosis not present

## 2020-04-30 DIAGNOSIS — R935 Abnormal findings on diagnostic imaging of other abdominal regions, including retroperitoneum: Secondary | ICD-10-CM | POA: Diagnosis not present

## 2020-04-30 DIAGNOSIS — R600 Localized edema: Secondary | ICD-10-CM | POA: Diagnosis not present

## 2020-05-01 DIAGNOSIS — D631 Anemia in chronic kidney disease: Secondary | ICD-10-CM | POA: Diagnosis not present

## 2020-05-01 DIAGNOSIS — E119 Type 2 diabetes mellitus without complications: Secondary | ICD-10-CM | POA: Diagnosis not present

## 2020-05-01 DIAGNOSIS — N186 End stage renal disease: Secondary | ICD-10-CM | POA: Diagnosis not present

## 2020-05-01 DIAGNOSIS — N2581 Secondary hyperparathyroidism of renal origin: Secondary | ICD-10-CM | POA: Diagnosis not present

## 2020-05-02 DIAGNOSIS — N2581 Secondary hyperparathyroidism of renal origin: Secondary | ICD-10-CM | POA: Diagnosis not present

## 2020-05-02 DIAGNOSIS — D631 Anemia in chronic kidney disease: Secondary | ICD-10-CM | POA: Diagnosis not present

## 2020-05-02 DIAGNOSIS — N186 End stage renal disease: Secondary | ICD-10-CM | POA: Diagnosis not present

## 2020-05-03 DIAGNOSIS — D631 Anemia in chronic kidney disease: Secondary | ICD-10-CM | POA: Diagnosis not present

## 2020-05-03 DIAGNOSIS — N2581 Secondary hyperparathyroidism of renal origin: Secondary | ICD-10-CM | POA: Diagnosis not present

## 2020-05-03 DIAGNOSIS — N186 End stage renal disease: Secondary | ICD-10-CM | POA: Diagnosis not present

## 2020-05-04 DIAGNOSIS — N186 End stage renal disease: Secondary | ICD-10-CM | POA: Diagnosis not present

## 2020-05-04 DIAGNOSIS — N2581 Secondary hyperparathyroidism of renal origin: Secondary | ICD-10-CM | POA: Diagnosis not present

## 2020-05-04 DIAGNOSIS — D631 Anemia in chronic kidney disease: Secondary | ICD-10-CM | POA: Diagnosis not present

## 2020-05-05 DIAGNOSIS — N186 End stage renal disease: Secondary | ICD-10-CM | POA: Diagnosis not present

## 2020-05-05 DIAGNOSIS — D631 Anemia in chronic kidney disease: Secondary | ICD-10-CM | POA: Diagnosis not present

## 2020-05-05 DIAGNOSIS — N2581 Secondary hyperparathyroidism of renal origin: Secondary | ICD-10-CM | POA: Diagnosis not present

## 2020-05-06 DIAGNOSIS — N2581 Secondary hyperparathyroidism of renal origin: Secondary | ICD-10-CM | POA: Diagnosis not present

## 2020-05-06 DIAGNOSIS — N186 End stage renal disease: Secondary | ICD-10-CM | POA: Diagnosis not present

## 2020-05-06 DIAGNOSIS — D631 Anemia in chronic kidney disease: Secondary | ICD-10-CM | POA: Diagnosis not present

## 2020-05-07 DIAGNOSIS — N186 End stage renal disease: Secondary | ICD-10-CM | POA: Diagnosis not present

## 2020-05-07 DIAGNOSIS — N2581 Secondary hyperparathyroidism of renal origin: Secondary | ICD-10-CM | POA: Diagnosis not present

## 2020-05-07 DIAGNOSIS — D631 Anemia in chronic kidney disease: Secondary | ICD-10-CM | POA: Diagnosis not present

## 2020-05-08 DIAGNOSIS — N2581 Secondary hyperparathyroidism of renal origin: Secondary | ICD-10-CM | POA: Diagnosis not present

## 2020-05-08 DIAGNOSIS — D631 Anemia in chronic kidney disease: Secondary | ICD-10-CM | POA: Diagnosis not present

## 2020-05-08 DIAGNOSIS — N186 End stage renal disease: Secondary | ICD-10-CM | POA: Diagnosis not present

## 2020-05-10 DIAGNOSIS — N186 End stage renal disease: Secondary | ICD-10-CM | POA: Diagnosis not present

## 2020-05-10 DIAGNOSIS — N2581 Secondary hyperparathyroidism of renal origin: Secondary | ICD-10-CM | POA: Diagnosis not present

## 2020-05-10 DIAGNOSIS — D631 Anemia in chronic kidney disease: Secondary | ICD-10-CM | POA: Diagnosis not present

## 2020-05-11 DIAGNOSIS — N2581 Secondary hyperparathyroidism of renal origin: Secondary | ICD-10-CM | POA: Diagnosis not present

## 2020-05-11 DIAGNOSIS — N186 End stage renal disease: Secondary | ICD-10-CM | POA: Diagnosis not present

## 2020-05-11 DIAGNOSIS — D631 Anemia in chronic kidney disease: Secondary | ICD-10-CM | POA: Diagnosis not present

## 2020-05-12 DIAGNOSIS — N186 End stage renal disease: Secondary | ICD-10-CM | POA: Diagnosis not present

## 2020-05-12 DIAGNOSIS — D631 Anemia in chronic kidney disease: Secondary | ICD-10-CM | POA: Diagnosis not present

## 2020-05-12 DIAGNOSIS — N2581 Secondary hyperparathyroidism of renal origin: Secondary | ICD-10-CM | POA: Diagnosis not present

## 2020-05-13 DIAGNOSIS — N2581 Secondary hyperparathyroidism of renal origin: Secondary | ICD-10-CM | POA: Diagnosis not present

## 2020-05-13 DIAGNOSIS — N186 End stage renal disease: Secondary | ICD-10-CM | POA: Diagnosis not present

## 2020-05-13 DIAGNOSIS — D631 Anemia in chronic kidney disease: Secondary | ICD-10-CM | POA: Diagnosis not present

## 2020-05-14 DIAGNOSIS — D631 Anemia in chronic kidney disease: Secondary | ICD-10-CM | POA: Diagnosis not present

## 2020-05-14 DIAGNOSIS — N2581 Secondary hyperparathyroidism of renal origin: Secondary | ICD-10-CM | POA: Diagnosis not present

## 2020-05-14 DIAGNOSIS — N186 End stage renal disease: Secondary | ICD-10-CM | POA: Diagnosis not present

## 2020-05-15 DIAGNOSIS — D631 Anemia in chronic kidney disease: Secondary | ICD-10-CM | POA: Diagnosis not present

## 2020-05-15 DIAGNOSIS — N186 End stage renal disease: Secondary | ICD-10-CM | POA: Diagnosis not present

## 2020-05-15 DIAGNOSIS — N2581 Secondary hyperparathyroidism of renal origin: Secondary | ICD-10-CM | POA: Diagnosis not present

## 2020-05-17 DIAGNOSIS — N2581 Secondary hyperparathyroidism of renal origin: Secondary | ICD-10-CM | POA: Diagnosis not present

## 2020-05-17 DIAGNOSIS — D631 Anemia in chronic kidney disease: Secondary | ICD-10-CM | POA: Diagnosis not present

## 2020-05-17 DIAGNOSIS — N186 End stage renal disease: Secondary | ICD-10-CM | POA: Diagnosis not present

## 2020-05-18 DIAGNOSIS — N2581 Secondary hyperparathyroidism of renal origin: Secondary | ICD-10-CM | POA: Diagnosis not present

## 2020-05-18 DIAGNOSIS — N186 End stage renal disease: Secondary | ICD-10-CM | POA: Diagnosis not present

## 2020-05-18 DIAGNOSIS — D631 Anemia in chronic kidney disease: Secondary | ICD-10-CM | POA: Diagnosis not present

## 2020-05-19 DIAGNOSIS — D631 Anemia in chronic kidney disease: Secondary | ICD-10-CM | POA: Diagnosis not present

## 2020-05-19 DIAGNOSIS — N2581 Secondary hyperparathyroidism of renal origin: Secondary | ICD-10-CM | POA: Diagnosis not present

## 2020-05-19 DIAGNOSIS — N186 End stage renal disease: Secondary | ICD-10-CM | POA: Diagnosis not present

## 2020-05-20 DIAGNOSIS — N2581 Secondary hyperparathyroidism of renal origin: Secondary | ICD-10-CM | POA: Diagnosis not present

## 2020-05-20 DIAGNOSIS — D631 Anemia in chronic kidney disease: Secondary | ICD-10-CM | POA: Diagnosis not present

## 2020-05-20 DIAGNOSIS — N186 End stage renal disease: Secondary | ICD-10-CM | POA: Diagnosis not present

## 2020-05-21 ENCOUNTER — Other Ambulatory Visit (HOSPITAL_COMMUNITY): Payer: Medicare Other

## 2020-05-22 DIAGNOSIS — N186 End stage renal disease: Secondary | ICD-10-CM | POA: Diagnosis not present

## 2020-05-22 DIAGNOSIS — N2581 Secondary hyperparathyroidism of renal origin: Secondary | ICD-10-CM | POA: Diagnosis not present

## 2020-05-22 DIAGNOSIS — D631 Anemia in chronic kidney disease: Secondary | ICD-10-CM | POA: Diagnosis not present

## 2020-05-23 DIAGNOSIS — D631 Anemia in chronic kidney disease: Secondary | ICD-10-CM | POA: Diagnosis not present

## 2020-05-23 DIAGNOSIS — N2581 Secondary hyperparathyroidism of renal origin: Secondary | ICD-10-CM | POA: Diagnosis not present

## 2020-05-23 DIAGNOSIS — N186 End stage renal disease: Secondary | ICD-10-CM | POA: Diagnosis not present

## 2020-05-24 DIAGNOSIS — N2581 Secondary hyperparathyroidism of renal origin: Secondary | ICD-10-CM | POA: Diagnosis not present

## 2020-05-24 DIAGNOSIS — N186 End stage renal disease: Secondary | ICD-10-CM | POA: Diagnosis not present

## 2020-05-24 DIAGNOSIS — D631 Anemia in chronic kidney disease: Secondary | ICD-10-CM | POA: Diagnosis not present

## 2020-05-25 DIAGNOSIS — N2581 Secondary hyperparathyroidism of renal origin: Secondary | ICD-10-CM | POA: Diagnosis not present

## 2020-05-25 DIAGNOSIS — D631 Anemia in chronic kidney disease: Secondary | ICD-10-CM | POA: Diagnosis not present

## 2020-05-25 DIAGNOSIS — N186 End stage renal disease: Secondary | ICD-10-CM | POA: Diagnosis not present

## 2020-05-26 DIAGNOSIS — D631 Anemia in chronic kidney disease: Secondary | ICD-10-CM | POA: Diagnosis not present

## 2020-05-26 DIAGNOSIS — N186 End stage renal disease: Secondary | ICD-10-CM | POA: Diagnosis not present

## 2020-05-26 DIAGNOSIS — N2581 Secondary hyperparathyroidism of renal origin: Secondary | ICD-10-CM | POA: Diagnosis not present

## 2020-05-27 DIAGNOSIS — D631 Anemia in chronic kidney disease: Secondary | ICD-10-CM | POA: Diagnosis not present

## 2020-05-27 DIAGNOSIS — N186 End stage renal disease: Secondary | ICD-10-CM | POA: Diagnosis not present

## 2020-05-27 DIAGNOSIS — N2581 Secondary hyperparathyroidism of renal origin: Secondary | ICD-10-CM | POA: Diagnosis not present

## 2020-05-29 DIAGNOSIS — N186 End stage renal disease: Secondary | ICD-10-CM | POA: Diagnosis not present

## 2020-05-29 DIAGNOSIS — Z992 Dependence on renal dialysis: Secondary | ICD-10-CM | POA: Diagnosis not present

## 2020-05-29 DIAGNOSIS — D631 Anemia in chronic kidney disease: Secondary | ICD-10-CM | POA: Diagnosis not present

## 2020-05-29 DIAGNOSIS — N2581 Secondary hyperparathyroidism of renal origin: Secondary | ICD-10-CM | POA: Diagnosis not present

## 2020-05-30 DIAGNOSIS — N2581 Secondary hyperparathyroidism of renal origin: Secondary | ICD-10-CM | POA: Diagnosis not present

## 2020-05-30 DIAGNOSIS — D631 Anemia in chronic kidney disease: Secondary | ICD-10-CM | POA: Diagnosis not present

## 2020-05-30 DIAGNOSIS — N186 End stage renal disease: Secondary | ICD-10-CM | POA: Diagnosis not present

## 2020-06-01 DIAGNOSIS — D631 Anemia in chronic kidney disease: Secondary | ICD-10-CM | POA: Diagnosis not present

## 2020-06-01 DIAGNOSIS — N186 End stage renal disease: Secondary | ICD-10-CM | POA: Diagnosis not present

## 2020-06-01 DIAGNOSIS — N2581 Secondary hyperparathyroidism of renal origin: Secondary | ICD-10-CM | POA: Diagnosis not present

## 2020-06-02 DIAGNOSIS — D631 Anemia in chronic kidney disease: Secondary | ICD-10-CM | POA: Diagnosis not present

## 2020-06-02 DIAGNOSIS — N2581 Secondary hyperparathyroidism of renal origin: Secondary | ICD-10-CM | POA: Diagnosis not present

## 2020-06-02 DIAGNOSIS — N186 End stage renal disease: Secondary | ICD-10-CM | POA: Diagnosis not present

## 2020-06-03 DIAGNOSIS — N2581 Secondary hyperparathyroidism of renal origin: Secondary | ICD-10-CM | POA: Diagnosis not present

## 2020-06-03 DIAGNOSIS — D631 Anemia in chronic kidney disease: Secondary | ICD-10-CM | POA: Diagnosis not present

## 2020-06-03 DIAGNOSIS — N186 End stage renal disease: Secondary | ICD-10-CM | POA: Diagnosis not present

## 2020-06-05 DIAGNOSIS — D631 Anemia in chronic kidney disease: Secondary | ICD-10-CM | POA: Diagnosis not present

## 2020-06-05 DIAGNOSIS — N186 End stage renal disease: Secondary | ICD-10-CM | POA: Diagnosis not present

## 2020-06-05 DIAGNOSIS — N2581 Secondary hyperparathyroidism of renal origin: Secondary | ICD-10-CM | POA: Diagnosis not present

## 2020-06-06 DIAGNOSIS — N186 End stage renal disease: Secondary | ICD-10-CM | POA: Diagnosis not present

## 2020-06-06 DIAGNOSIS — D631 Anemia in chronic kidney disease: Secondary | ICD-10-CM | POA: Diagnosis not present

## 2020-06-06 DIAGNOSIS — N2581 Secondary hyperparathyroidism of renal origin: Secondary | ICD-10-CM | POA: Diagnosis not present

## 2020-06-08 DIAGNOSIS — N2581 Secondary hyperparathyroidism of renal origin: Secondary | ICD-10-CM | POA: Diagnosis not present

## 2020-06-08 DIAGNOSIS — N186 End stage renal disease: Secondary | ICD-10-CM | POA: Diagnosis not present

## 2020-06-08 DIAGNOSIS — D631 Anemia in chronic kidney disease: Secondary | ICD-10-CM | POA: Diagnosis not present

## 2020-06-10 DIAGNOSIS — I10 Essential (primary) hypertension: Secondary | ICD-10-CM | POA: Diagnosis not present

## 2020-06-10 DIAGNOSIS — K121 Other forms of stomatitis: Secondary | ICD-10-CM | POA: Diagnosis not present

## 2020-06-12 DIAGNOSIS — D631 Anemia in chronic kidney disease: Secondary | ICD-10-CM | POA: Diagnosis not present

## 2020-06-12 DIAGNOSIS — N2581 Secondary hyperparathyroidism of renal origin: Secondary | ICD-10-CM | POA: Diagnosis not present

## 2020-06-12 DIAGNOSIS — N186 End stage renal disease: Secondary | ICD-10-CM | POA: Diagnosis not present

## 2020-06-14 DIAGNOSIS — N2581 Secondary hyperparathyroidism of renal origin: Secondary | ICD-10-CM | POA: Diagnosis not present

## 2020-06-14 DIAGNOSIS — D631 Anemia in chronic kidney disease: Secondary | ICD-10-CM | POA: Diagnosis not present

## 2020-06-14 DIAGNOSIS — N186 End stage renal disease: Secondary | ICD-10-CM | POA: Diagnosis not present

## 2020-06-20 DIAGNOSIS — N186 End stage renal disease: Secondary | ICD-10-CM | POA: Diagnosis not present

## 2020-06-20 DIAGNOSIS — D631 Anemia in chronic kidney disease: Secondary | ICD-10-CM | POA: Diagnosis not present

## 2020-06-20 DIAGNOSIS — N2581 Secondary hyperparathyroidism of renal origin: Secondary | ICD-10-CM | POA: Diagnosis not present

## 2020-06-21 DIAGNOSIS — N186 End stage renal disease: Secondary | ICD-10-CM | POA: Diagnosis not present

## 2020-06-21 DIAGNOSIS — N2581 Secondary hyperparathyroidism of renal origin: Secondary | ICD-10-CM | POA: Diagnosis not present

## 2020-06-21 DIAGNOSIS — D631 Anemia in chronic kidney disease: Secondary | ICD-10-CM | POA: Diagnosis not present

## 2020-06-22 DIAGNOSIS — N186 End stage renal disease: Secondary | ICD-10-CM | POA: Diagnosis not present

## 2020-06-22 DIAGNOSIS — D631 Anemia in chronic kidney disease: Secondary | ICD-10-CM | POA: Diagnosis not present

## 2020-06-22 DIAGNOSIS — N2581 Secondary hyperparathyroidism of renal origin: Secondary | ICD-10-CM | POA: Diagnosis not present

## 2020-06-23 DIAGNOSIS — N2581 Secondary hyperparathyroidism of renal origin: Secondary | ICD-10-CM | POA: Diagnosis not present

## 2020-06-23 DIAGNOSIS — D631 Anemia in chronic kidney disease: Secondary | ICD-10-CM | POA: Diagnosis not present

## 2020-06-23 DIAGNOSIS — N186 End stage renal disease: Secondary | ICD-10-CM | POA: Diagnosis not present

## 2020-06-25 DIAGNOSIS — N186 End stage renal disease: Secondary | ICD-10-CM | POA: Diagnosis not present

## 2020-06-25 DIAGNOSIS — D631 Anemia in chronic kidney disease: Secondary | ICD-10-CM | POA: Diagnosis not present

## 2020-06-25 DIAGNOSIS — N2581 Secondary hyperparathyroidism of renal origin: Secondary | ICD-10-CM | POA: Diagnosis not present

## 2020-06-26 DIAGNOSIS — D631 Anemia in chronic kidney disease: Secondary | ICD-10-CM | POA: Diagnosis not present

## 2020-06-26 DIAGNOSIS — N186 End stage renal disease: Secondary | ICD-10-CM | POA: Diagnosis not present

## 2020-06-26 DIAGNOSIS — N2581 Secondary hyperparathyroidism of renal origin: Secondary | ICD-10-CM | POA: Diagnosis not present

## 2020-06-27 DIAGNOSIS — D631 Anemia in chronic kidney disease: Secondary | ICD-10-CM | POA: Diagnosis not present

## 2020-06-27 DIAGNOSIS — N2581 Secondary hyperparathyroidism of renal origin: Secondary | ICD-10-CM | POA: Diagnosis not present

## 2020-06-27 DIAGNOSIS — N186 End stage renal disease: Secondary | ICD-10-CM | POA: Diagnosis not present

## 2020-06-28 DIAGNOSIS — N186 End stage renal disease: Secondary | ICD-10-CM | POA: Diagnosis not present

## 2020-06-28 DIAGNOSIS — N2581 Secondary hyperparathyroidism of renal origin: Secondary | ICD-10-CM | POA: Diagnosis not present

## 2020-06-28 DIAGNOSIS — Z992 Dependence on renal dialysis: Secondary | ICD-10-CM | POA: Diagnosis not present

## 2020-06-28 DIAGNOSIS — D631 Anemia in chronic kidney disease: Secondary | ICD-10-CM | POA: Diagnosis not present

## 2020-06-29 DIAGNOSIS — N2581 Secondary hyperparathyroidism of renal origin: Secondary | ICD-10-CM | POA: Diagnosis not present

## 2020-06-29 DIAGNOSIS — D631 Anemia in chronic kidney disease: Secondary | ICD-10-CM | POA: Diagnosis not present

## 2020-06-29 DIAGNOSIS — N186 End stage renal disease: Secondary | ICD-10-CM | POA: Diagnosis not present

## 2020-07-01 DIAGNOSIS — D631 Anemia in chronic kidney disease: Secondary | ICD-10-CM | POA: Diagnosis not present

## 2020-07-01 DIAGNOSIS — N2581 Secondary hyperparathyroidism of renal origin: Secondary | ICD-10-CM | POA: Diagnosis not present

## 2020-07-01 DIAGNOSIS — N186 End stage renal disease: Secondary | ICD-10-CM | POA: Diagnosis not present

## 2020-07-02 DIAGNOSIS — N186 End stage renal disease: Secondary | ICD-10-CM | POA: Diagnosis not present

## 2020-07-02 DIAGNOSIS — D631 Anemia in chronic kidney disease: Secondary | ICD-10-CM | POA: Diagnosis not present

## 2020-07-02 DIAGNOSIS — N2581 Secondary hyperparathyroidism of renal origin: Secondary | ICD-10-CM | POA: Diagnosis not present

## 2020-07-03 DIAGNOSIS — D631 Anemia in chronic kidney disease: Secondary | ICD-10-CM | POA: Diagnosis not present

## 2020-07-03 DIAGNOSIS — N2581 Secondary hyperparathyroidism of renal origin: Secondary | ICD-10-CM | POA: Diagnosis not present

## 2020-07-03 DIAGNOSIS — N186 End stage renal disease: Secondary | ICD-10-CM | POA: Diagnosis not present

## 2020-07-05 DIAGNOSIS — N186 End stage renal disease: Secondary | ICD-10-CM | POA: Diagnosis not present

## 2020-07-05 DIAGNOSIS — N2581 Secondary hyperparathyroidism of renal origin: Secondary | ICD-10-CM | POA: Diagnosis not present

## 2020-07-05 DIAGNOSIS — D631 Anemia in chronic kidney disease: Secondary | ICD-10-CM | POA: Diagnosis not present

## 2020-07-12 DIAGNOSIS — D631 Anemia in chronic kidney disease: Secondary | ICD-10-CM | POA: Diagnosis not present

## 2020-07-12 DIAGNOSIS — N186 End stage renal disease: Secondary | ICD-10-CM | POA: Diagnosis not present

## 2020-07-12 DIAGNOSIS — N2581 Secondary hyperparathyroidism of renal origin: Secondary | ICD-10-CM | POA: Diagnosis not present

## 2020-07-13 DIAGNOSIS — N2581 Secondary hyperparathyroidism of renal origin: Secondary | ICD-10-CM | POA: Diagnosis not present

## 2020-07-13 DIAGNOSIS — D631 Anemia in chronic kidney disease: Secondary | ICD-10-CM | POA: Diagnosis not present

## 2020-07-13 DIAGNOSIS — N186 End stage renal disease: Secondary | ICD-10-CM | POA: Diagnosis not present

## 2020-07-14 DIAGNOSIS — D631 Anemia in chronic kidney disease: Secondary | ICD-10-CM | POA: Diagnosis not present

## 2020-07-14 DIAGNOSIS — N186 End stage renal disease: Secondary | ICD-10-CM | POA: Diagnosis not present

## 2020-07-14 DIAGNOSIS — N2581 Secondary hyperparathyroidism of renal origin: Secondary | ICD-10-CM | POA: Diagnosis not present

## 2020-07-15 DIAGNOSIS — D631 Anemia in chronic kidney disease: Secondary | ICD-10-CM | POA: Diagnosis not present

## 2020-07-15 DIAGNOSIS — N186 End stage renal disease: Secondary | ICD-10-CM | POA: Diagnosis not present

## 2020-07-15 DIAGNOSIS — N2581 Secondary hyperparathyroidism of renal origin: Secondary | ICD-10-CM | POA: Diagnosis not present

## 2020-07-16 DIAGNOSIS — D631 Anemia in chronic kidney disease: Secondary | ICD-10-CM | POA: Diagnosis not present

## 2020-07-16 DIAGNOSIS — N186 End stage renal disease: Secondary | ICD-10-CM | POA: Diagnosis not present

## 2020-07-16 DIAGNOSIS — N2581 Secondary hyperparathyroidism of renal origin: Secondary | ICD-10-CM | POA: Diagnosis not present

## 2020-07-17 DIAGNOSIS — N2581 Secondary hyperparathyroidism of renal origin: Secondary | ICD-10-CM | POA: Diagnosis not present

## 2020-07-17 DIAGNOSIS — D631 Anemia in chronic kidney disease: Secondary | ICD-10-CM | POA: Diagnosis not present

## 2020-07-17 DIAGNOSIS — N186 End stage renal disease: Secondary | ICD-10-CM | POA: Diagnosis not present

## 2020-07-20 DIAGNOSIS — N186 End stage renal disease: Secondary | ICD-10-CM | POA: Diagnosis not present

## 2020-07-20 DIAGNOSIS — D631 Anemia in chronic kidney disease: Secondary | ICD-10-CM | POA: Diagnosis not present

## 2020-07-20 DIAGNOSIS — N2581 Secondary hyperparathyroidism of renal origin: Secondary | ICD-10-CM | POA: Diagnosis not present

## 2020-07-23 DIAGNOSIS — N186 End stage renal disease: Secondary | ICD-10-CM | POA: Diagnosis not present

## 2020-07-23 DIAGNOSIS — D631 Anemia in chronic kidney disease: Secondary | ICD-10-CM | POA: Diagnosis not present

## 2020-07-23 DIAGNOSIS — N2581 Secondary hyperparathyroidism of renal origin: Secondary | ICD-10-CM | POA: Diagnosis not present

## 2020-07-23 DIAGNOSIS — E119 Type 2 diabetes mellitus without complications: Secondary | ICD-10-CM | POA: Diagnosis not present

## 2020-07-23 DIAGNOSIS — Z1159 Encounter for screening for other viral diseases: Secondary | ICD-10-CM | POA: Diagnosis not present

## 2020-07-24 DIAGNOSIS — N186 End stage renal disease: Secondary | ICD-10-CM | POA: Diagnosis not present

## 2020-07-24 DIAGNOSIS — N250 Renal osteodystrophy: Secondary | ICD-10-CM | POA: Diagnosis not present

## 2020-07-24 DIAGNOSIS — D649 Anemia, unspecified: Secondary | ICD-10-CM | POA: Diagnosis not present

## 2020-07-24 DIAGNOSIS — R5383 Other fatigue: Secondary | ICD-10-CM | POA: Diagnosis not present

## 2020-07-24 DIAGNOSIS — Z992 Dependence on renal dialysis: Secondary | ICD-10-CM | POA: Diagnosis not present

## 2020-07-24 DIAGNOSIS — R079 Chest pain, unspecified: Secondary | ICD-10-CM | POA: Diagnosis not present

## 2020-07-24 DIAGNOSIS — Z79899 Other long term (current) drug therapy: Secondary | ICD-10-CM | POA: Diagnosis not present

## 2020-07-24 DIAGNOSIS — D631 Anemia in chronic kidney disease: Secondary | ICD-10-CM | POA: Diagnosis not present

## 2020-07-24 DIAGNOSIS — I120 Hypertensive chronic kidney disease with stage 5 chronic kidney disease or end stage renal disease: Secondary | ICD-10-CM | POA: Diagnosis not present

## 2020-07-25 DIAGNOSIS — N186 End stage renal disease: Secondary | ICD-10-CM | POA: Diagnosis not present

## 2020-07-25 DIAGNOSIS — D631 Anemia in chronic kidney disease: Secondary | ICD-10-CM | POA: Diagnosis not present

## 2020-07-25 DIAGNOSIS — N2581 Secondary hyperparathyroidism of renal origin: Secondary | ICD-10-CM | POA: Diagnosis not present

## 2020-07-29 DIAGNOSIS — Z992 Dependence on renal dialysis: Secondary | ICD-10-CM | POA: Diagnosis not present

## 2020-07-29 DIAGNOSIS — N186 End stage renal disease: Secondary | ICD-10-CM | POA: Diagnosis not present

## 2020-07-30 DIAGNOSIS — N186 End stage renal disease: Secondary | ICD-10-CM | POA: Diagnosis not present

## 2020-07-30 DIAGNOSIS — Z992 Dependence on renal dialysis: Secondary | ICD-10-CM | POA: Diagnosis not present

## 2020-07-31 DIAGNOSIS — Z992 Dependence on renal dialysis: Secondary | ICD-10-CM | POA: Diagnosis not present

## 2020-07-31 DIAGNOSIS — D631 Anemia in chronic kidney disease: Secondary | ICD-10-CM | POA: Diagnosis not present

## 2020-07-31 DIAGNOSIS — N186 End stage renal disease: Secondary | ICD-10-CM | POA: Diagnosis not present

## 2020-07-31 DIAGNOSIS — R7881 Bacteremia: Secondary | ICD-10-CM | POA: Diagnosis not present

## 2020-07-31 DIAGNOSIS — N2581 Secondary hyperparathyroidism of renal origin: Secondary | ICD-10-CM | POA: Diagnosis not present

## 2020-08-01 DIAGNOSIS — N186 End stage renal disease: Secondary | ICD-10-CM | POA: Diagnosis not present

## 2020-08-01 DIAGNOSIS — Z992 Dependence on renal dialysis: Secondary | ICD-10-CM | POA: Diagnosis not present

## 2020-08-02 DIAGNOSIS — Z992 Dependence on renal dialysis: Secondary | ICD-10-CM | POA: Diagnosis not present

## 2020-08-02 DIAGNOSIS — N186 End stage renal disease: Secondary | ICD-10-CM | POA: Diagnosis not present

## 2020-08-03 DIAGNOSIS — Z992 Dependence on renal dialysis: Secondary | ICD-10-CM | POA: Diagnosis not present

## 2020-08-03 DIAGNOSIS — N186 End stage renal disease: Secondary | ICD-10-CM | POA: Diagnosis not present

## 2020-08-04 DIAGNOSIS — Z992 Dependence on renal dialysis: Secondary | ICD-10-CM | POA: Diagnosis not present

## 2020-08-04 DIAGNOSIS — N186 End stage renal disease: Secondary | ICD-10-CM | POA: Diagnosis not present

## 2020-08-05 DIAGNOSIS — N186 End stage renal disease: Secondary | ICD-10-CM | POA: Diagnosis not present

## 2020-08-05 DIAGNOSIS — Z992 Dependence on renal dialysis: Secondary | ICD-10-CM | POA: Diagnosis not present

## 2020-08-06 DIAGNOSIS — R7881 Bacteremia: Secondary | ICD-10-CM | POA: Diagnosis not present

## 2020-08-06 DIAGNOSIS — N186 End stage renal disease: Secondary | ICD-10-CM | POA: Diagnosis not present

## 2020-08-06 DIAGNOSIS — Z992 Dependence on renal dialysis: Secondary | ICD-10-CM | POA: Diagnosis not present

## 2020-08-06 DIAGNOSIS — N2581 Secondary hyperparathyroidism of renal origin: Secondary | ICD-10-CM | POA: Diagnosis not present

## 2020-08-06 DIAGNOSIS — D631 Anemia in chronic kidney disease: Secondary | ICD-10-CM | POA: Diagnosis not present

## 2020-08-07 DIAGNOSIS — Z992 Dependence on renal dialysis: Secondary | ICD-10-CM | POA: Diagnosis not present

## 2020-08-07 DIAGNOSIS — N186 End stage renal disease: Secondary | ICD-10-CM | POA: Diagnosis not present

## 2020-08-08 DIAGNOSIS — Z992 Dependence on renal dialysis: Secondary | ICD-10-CM | POA: Diagnosis not present

## 2020-08-08 DIAGNOSIS — N186 End stage renal disease: Secondary | ICD-10-CM | POA: Diagnosis not present

## 2020-08-09 DIAGNOSIS — N186 End stage renal disease: Secondary | ICD-10-CM | POA: Diagnosis not present

## 2020-08-09 DIAGNOSIS — Z992 Dependence on renal dialysis: Secondary | ICD-10-CM | POA: Diagnosis not present

## 2020-08-10 DIAGNOSIS — Z992 Dependence on renal dialysis: Secondary | ICD-10-CM | POA: Diagnosis not present

## 2020-08-10 DIAGNOSIS — N186 End stage renal disease: Secondary | ICD-10-CM | POA: Diagnosis not present

## 2020-08-11 DIAGNOSIS — N186 End stage renal disease: Secondary | ICD-10-CM | POA: Diagnosis not present

## 2020-08-11 DIAGNOSIS — Z992 Dependence on renal dialysis: Secondary | ICD-10-CM | POA: Diagnosis not present

## 2020-08-12 DIAGNOSIS — N186 End stage renal disease: Secondary | ICD-10-CM | POA: Diagnosis not present

## 2020-08-12 DIAGNOSIS — Z992 Dependence on renal dialysis: Secondary | ICD-10-CM | POA: Diagnosis not present

## 2020-08-13 DIAGNOSIS — N186 End stage renal disease: Secondary | ICD-10-CM | POA: Diagnosis not present

## 2020-08-13 DIAGNOSIS — Z992 Dependence on renal dialysis: Secondary | ICD-10-CM | POA: Diagnosis not present

## 2020-08-14 DIAGNOSIS — Z992 Dependence on renal dialysis: Secondary | ICD-10-CM | POA: Diagnosis not present

## 2020-08-14 DIAGNOSIS — F1510 Other stimulant abuse, uncomplicated: Secondary | ICD-10-CM | POA: Insufficient documentation

## 2020-08-14 DIAGNOSIS — G9340 Encephalopathy, unspecified: Secondary | ICD-10-CM | POA: Diagnosis not present

## 2020-08-14 DIAGNOSIS — E872 Acidosis, unspecified: Secondary | ICD-10-CM | POA: Insufficient documentation

## 2020-08-14 DIAGNOSIS — I454 Nonspecific intraventricular block: Secondary | ICD-10-CM | POA: Diagnosis not present

## 2020-08-14 DIAGNOSIS — R4182 Altered mental status, unspecified: Secondary | ICD-10-CM | POA: Diagnosis not present

## 2020-08-14 DIAGNOSIS — R188 Other ascites: Secondary | ICD-10-CM | POA: Diagnosis not present

## 2020-08-14 DIAGNOSIS — E1022 Type 1 diabetes mellitus with diabetic chronic kidney disease: Secondary | ICD-10-CM | POA: Diagnosis not present

## 2020-08-14 DIAGNOSIS — R791 Abnormal coagulation profile: Secondary | ICD-10-CM | POA: Diagnosis not present

## 2020-08-14 DIAGNOSIS — Z9119 Patient's noncompliance with other medical treatment and regimen: Secondary | ICD-10-CM | POA: Diagnosis not present

## 2020-08-14 DIAGNOSIS — N186 End stage renal disease: Secondary | ICD-10-CM | POA: Diagnosis not present

## 2020-08-14 DIAGNOSIS — E875 Hyperkalemia: Secondary | ICD-10-CM | POA: Insufficient documentation

## 2020-08-14 DIAGNOSIS — N028 Recurrent and persistent hematuria with other morphologic changes: Secondary | ICD-10-CM | POA: Diagnosis not present

## 2020-08-14 DIAGNOSIS — R918 Other nonspecific abnormal finding of lung field: Secondary | ICD-10-CM | POA: Diagnosis not present

## 2020-08-14 DIAGNOSIS — J90 Pleural effusion, not elsewhere classified: Secondary | ICD-10-CM | POA: Diagnosis not present

## 2020-08-14 DIAGNOSIS — E10649 Type 1 diabetes mellitus with hypoglycemia without coma: Secondary | ICD-10-CM | POA: Diagnosis not present

## 2020-08-14 DIAGNOSIS — F14929 Cocaine use, unspecified with intoxication, unspecified: Secondary | ICD-10-CM | POA: Insufficient documentation

## 2020-08-14 DIAGNOSIS — R7401 Elevation of levels of liver transaminase levels: Secondary | ICD-10-CM | POA: Diagnosis not present

## 2020-08-14 DIAGNOSIS — R531 Weakness: Secondary | ICD-10-CM | POA: Diagnosis not present

## 2020-08-14 DIAGNOSIS — R000 Tachycardia, unspecified: Secondary | ICD-10-CM | POA: Diagnosis not present

## 2020-08-15 DIAGNOSIS — Z9115 Patient's noncompliance with renal dialysis: Secondary | ICD-10-CM | POA: Insufficient documentation

## 2020-08-15 DIAGNOSIS — A419 Sepsis, unspecified organism: Secondary | ICD-10-CM | POA: Diagnosis not present

## 2020-08-15 DIAGNOSIS — I083 Combined rheumatic disorders of mitral, aortic and tricuspid valves: Secondary | ICD-10-CM | POA: Diagnosis not present

## 2020-08-15 DIAGNOSIS — Z01810 Encounter for preprocedural cardiovascular examination: Secondary | ICD-10-CM | POA: Diagnosis not present

## 2020-08-15 DIAGNOSIS — G9340 Encephalopathy, unspecified: Secondary | ICD-10-CM | POA: Diagnosis not present

## 2020-08-15 DIAGNOSIS — K7200 Acute and subacute hepatic failure without coma: Secondary | ICD-10-CM | POA: Diagnosis not present

## 2020-08-15 DIAGNOSIS — E875 Hyperkalemia: Secondary | ICD-10-CM | POA: Diagnosis not present

## 2020-08-15 DIAGNOSIS — E872 Acidosis: Secondary | ICD-10-CM | POA: Diagnosis not present

## 2020-08-15 DIAGNOSIS — Z992 Dependence on renal dialysis: Secondary | ICD-10-CM | POA: Diagnosis not present

## 2020-08-15 DIAGNOSIS — I10 Essential (primary) hypertension: Secondary | ICD-10-CM | POA: Diagnosis not present

## 2020-08-15 DIAGNOSIS — R7401 Elevation of levels of liver transaminase levels: Secondary | ICD-10-CM | POA: Diagnosis not present

## 2020-08-15 DIAGNOSIS — N2581 Secondary hyperparathyroidism of renal origin: Secondary | ICD-10-CM | POA: Diagnosis not present

## 2020-08-15 DIAGNOSIS — I272 Pulmonary hypertension, unspecified: Secondary | ICD-10-CM | POA: Diagnosis not present

## 2020-08-15 DIAGNOSIS — D631 Anemia in chronic kidney disease: Secondary | ICD-10-CM | POA: Diagnosis not present

## 2020-08-15 DIAGNOSIS — I313 Pericardial effusion (noninflammatory): Secondary | ICD-10-CM | POA: Diagnosis not present

## 2020-08-15 DIAGNOSIS — N186 End stage renal disease: Secondary | ICD-10-CM | POA: Diagnosis not present

## 2020-08-15 DIAGNOSIS — I5023 Acute on chronic systolic (congestive) heart failure: Secondary | ICD-10-CM | POA: Diagnosis not present

## 2020-08-17 DIAGNOSIS — K7200 Acute and subacute hepatic failure without coma: Secondary | ICD-10-CM | POA: Diagnosis not present

## 2020-08-17 DIAGNOSIS — R300 Dysuria: Secondary | ICD-10-CM | POA: Diagnosis not present

## 2020-08-17 DIAGNOSIS — Z992 Dependence on renal dialysis: Secondary | ICD-10-CM | POA: Diagnosis not present

## 2020-08-17 DIAGNOSIS — N186 End stage renal disease: Secondary | ICD-10-CM | POA: Diagnosis not present

## 2020-08-17 DIAGNOSIS — D631 Anemia in chronic kidney disease: Secondary | ICD-10-CM | POA: Diagnosis not present

## 2020-08-17 DIAGNOSIS — E875 Hyperkalemia: Secondary | ICD-10-CM | POA: Diagnosis not present

## 2020-08-17 DIAGNOSIS — I132 Hypertensive heart and chronic kidney disease with heart failure and with stage 5 chronic kidney disease, or end stage renal disease: Secondary | ICD-10-CM | POA: Diagnosis not present

## 2020-08-17 DIAGNOSIS — R000 Tachycardia, unspecified: Secondary | ICD-10-CM | POA: Diagnosis not present

## 2020-08-17 DIAGNOSIS — E872 Acidosis: Secondary | ICD-10-CM | POA: Diagnosis not present

## 2020-08-17 DIAGNOSIS — G9341 Metabolic encephalopathy: Secondary | ICD-10-CM | POA: Diagnosis not present

## 2020-08-17 DIAGNOSIS — E878 Other disorders of electrolyte and fluid balance, not elsewhere classified: Secondary | ICD-10-CM | POA: Diagnosis not present

## 2020-08-17 DIAGNOSIS — I5020 Unspecified systolic (congestive) heart failure: Secondary | ICD-10-CM | POA: Diagnosis not present

## 2020-08-18 DIAGNOSIS — I517 Cardiomegaly: Secondary | ICD-10-CM | POA: Diagnosis not present

## 2020-08-18 DIAGNOSIS — Z992 Dependence on renal dialysis: Secondary | ICD-10-CM | POA: Diagnosis not present

## 2020-08-18 DIAGNOSIS — R7881 Bacteremia: Secondary | ICD-10-CM | POA: Diagnosis not present

## 2020-08-18 DIAGNOSIS — I348 Other nonrheumatic mitral valve disorders: Secondary | ICD-10-CM | POA: Diagnosis not present

## 2020-08-18 DIAGNOSIS — I5023 Acute on chronic systolic (congestive) heart failure: Secondary | ICD-10-CM | POA: Diagnosis not present

## 2020-08-18 DIAGNOSIS — N186 End stage renal disease: Secondary | ICD-10-CM | POA: Diagnosis not present

## 2020-08-19 DIAGNOSIS — N2581 Secondary hyperparathyroidism of renal origin: Secondary | ICD-10-CM | POA: Diagnosis not present

## 2020-08-19 DIAGNOSIS — D631 Anemia in chronic kidney disease: Secondary | ICD-10-CM | POA: Diagnosis not present

## 2020-08-19 DIAGNOSIS — N186 End stage renal disease: Secondary | ICD-10-CM | POA: Diagnosis not present

## 2020-08-19 DIAGNOSIS — R9431 Abnormal electrocardiogram [ECG] [EKG]: Secondary | ICD-10-CM | POA: Diagnosis not present

## 2020-08-19 DIAGNOSIS — Z992 Dependence on renal dialysis: Secondary | ICD-10-CM | POA: Diagnosis not present

## 2020-08-19 DIAGNOSIS — R7881 Bacteremia: Secondary | ICD-10-CM | POA: Diagnosis not present

## 2020-08-20 DIAGNOSIS — Z992 Dependence on renal dialysis: Secondary | ICD-10-CM | POA: Diagnosis not present

## 2020-08-20 DIAGNOSIS — N2581 Secondary hyperparathyroidism of renal origin: Secondary | ICD-10-CM | POA: Diagnosis not present

## 2020-08-20 DIAGNOSIS — D631 Anemia in chronic kidney disease: Secondary | ICD-10-CM | POA: Diagnosis not present

## 2020-08-20 DIAGNOSIS — R7881 Bacteremia: Secondary | ICD-10-CM | POA: Diagnosis not present

## 2020-08-20 DIAGNOSIS — N186 End stage renal disease: Secondary | ICD-10-CM | POA: Diagnosis not present

## 2020-08-21 DIAGNOSIS — N186 End stage renal disease: Secondary | ICD-10-CM | POA: Diagnosis not present

## 2020-08-21 DIAGNOSIS — R7881 Bacteremia: Secondary | ICD-10-CM | POA: Diagnosis not present

## 2020-08-21 DIAGNOSIS — D631 Anemia in chronic kidney disease: Secondary | ICD-10-CM | POA: Diagnosis not present

## 2020-08-21 DIAGNOSIS — N2581 Secondary hyperparathyroidism of renal origin: Secondary | ICD-10-CM | POA: Diagnosis not present

## 2020-08-21 DIAGNOSIS — Z992 Dependence on renal dialysis: Secondary | ICD-10-CM | POA: Diagnosis not present

## 2020-08-22 DIAGNOSIS — N2581 Secondary hyperparathyroidism of renal origin: Secondary | ICD-10-CM | POA: Diagnosis not present

## 2020-08-22 DIAGNOSIS — E119 Type 2 diabetes mellitus without complications: Secondary | ICD-10-CM | POA: Diagnosis not present

## 2020-08-22 DIAGNOSIS — R7881 Bacteremia: Secondary | ICD-10-CM | POA: Diagnosis not present

## 2020-08-22 DIAGNOSIS — N186 End stage renal disease: Secondary | ICD-10-CM | POA: Diagnosis not present

## 2020-08-22 DIAGNOSIS — D631 Anemia in chronic kidney disease: Secondary | ICD-10-CM | POA: Diagnosis not present

## 2020-08-22 DIAGNOSIS — Z992 Dependence on renal dialysis: Secondary | ICD-10-CM | POA: Diagnosis not present

## 2020-08-23 DIAGNOSIS — N186 End stage renal disease: Secondary | ICD-10-CM | POA: Diagnosis not present

## 2020-08-23 DIAGNOSIS — D631 Anemia in chronic kidney disease: Secondary | ICD-10-CM | POA: Diagnosis not present

## 2020-08-23 DIAGNOSIS — R7881 Bacteremia: Secondary | ICD-10-CM | POA: Diagnosis not present

## 2020-08-23 DIAGNOSIS — N2581 Secondary hyperparathyroidism of renal origin: Secondary | ICD-10-CM | POA: Diagnosis not present

## 2020-08-23 DIAGNOSIS — Z992 Dependence on renal dialysis: Secondary | ICD-10-CM | POA: Diagnosis not present

## 2020-08-24 DIAGNOSIS — D631 Anemia in chronic kidney disease: Secondary | ICD-10-CM | POA: Diagnosis not present

## 2020-08-24 DIAGNOSIS — R7881 Bacteremia: Secondary | ICD-10-CM | POA: Diagnosis not present

## 2020-08-24 DIAGNOSIS — N2581 Secondary hyperparathyroidism of renal origin: Secondary | ICD-10-CM | POA: Diagnosis not present

## 2020-08-24 DIAGNOSIS — N186 End stage renal disease: Secondary | ICD-10-CM | POA: Diagnosis not present

## 2020-08-24 DIAGNOSIS — Z992 Dependence on renal dialysis: Secondary | ICD-10-CM | POA: Diagnosis not present

## 2020-08-25 DIAGNOSIS — Z992 Dependence on renal dialysis: Secondary | ICD-10-CM | POA: Diagnosis not present

## 2020-08-25 DIAGNOSIS — D631 Anemia in chronic kidney disease: Secondary | ICD-10-CM | POA: Diagnosis not present

## 2020-08-25 DIAGNOSIS — N186 End stage renal disease: Secondary | ICD-10-CM | POA: Diagnosis not present

## 2020-08-25 DIAGNOSIS — N2581 Secondary hyperparathyroidism of renal origin: Secondary | ICD-10-CM | POA: Diagnosis not present

## 2020-08-25 DIAGNOSIS — R7881 Bacteremia: Secondary | ICD-10-CM | POA: Diagnosis not present

## 2020-08-26 DIAGNOSIS — R7881 Bacteremia: Secondary | ICD-10-CM | POA: Diagnosis not present

## 2020-08-26 DIAGNOSIS — N186 End stage renal disease: Secondary | ICD-10-CM | POA: Diagnosis not present

## 2020-08-26 DIAGNOSIS — D631 Anemia in chronic kidney disease: Secondary | ICD-10-CM | POA: Diagnosis not present

## 2020-08-26 DIAGNOSIS — N2581 Secondary hyperparathyroidism of renal origin: Secondary | ICD-10-CM | POA: Diagnosis not present

## 2020-08-26 DIAGNOSIS — Z992 Dependence on renal dialysis: Secondary | ICD-10-CM | POA: Diagnosis not present

## 2020-08-27 DIAGNOSIS — D631 Anemia in chronic kidney disease: Secondary | ICD-10-CM | POA: Diagnosis not present

## 2020-08-27 DIAGNOSIS — R7881 Bacteremia: Secondary | ICD-10-CM | POA: Diagnosis not present

## 2020-08-27 DIAGNOSIS — Z992 Dependence on renal dialysis: Secondary | ICD-10-CM | POA: Diagnosis not present

## 2020-08-27 DIAGNOSIS — N2581 Secondary hyperparathyroidism of renal origin: Secondary | ICD-10-CM | POA: Diagnosis not present

## 2020-08-27 DIAGNOSIS — N186 End stage renal disease: Secondary | ICD-10-CM | POA: Diagnosis not present

## 2020-08-28 DIAGNOSIS — D631 Anemia in chronic kidney disease: Secondary | ICD-10-CM | POA: Diagnosis not present

## 2020-08-28 DIAGNOSIS — N186 End stage renal disease: Secondary | ICD-10-CM | POA: Diagnosis not present

## 2020-08-28 DIAGNOSIS — Z992 Dependence on renal dialysis: Secondary | ICD-10-CM | POA: Diagnosis not present

## 2020-08-28 DIAGNOSIS — N2581 Secondary hyperparathyroidism of renal origin: Secondary | ICD-10-CM | POA: Diagnosis not present

## 2020-08-28 DIAGNOSIS — R7881 Bacteremia: Secondary | ICD-10-CM | POA: Diagnosis not present

## 2020-08-29 DIAGNOSIS — N186 End stage renal disease: Secondary | ICD-10-CM | POA: Diagnosis not present

## 2020-08-29 DIAGNOSIS — D631 Anemia in chronic kidney disease: Secondary | ICD-10-CM | POA: Diagnosis not present

## 2020-08-29 DIAGNOSIS — N2581 Secondary hyperparathyroidism of renal origin: Secondary | ICD-10-CM | POA: Diagnosis not present

## 2020-08-30 DIAGNOSIS — N186 End stage renal disease: Secondary | ICD-10-CM | POA: Diagnosis not present

## 2020-08-30 DIAGNOSIS — N2581 Secondary hyperparathyroidism of renal origin: Secondary | ICD-10-CM | POA: Diagnosis not present

## 2020-08-30 DIAGNOSIS — D631 Anemia in chronic kidney disease: Secondary | ICD-10-CM | POA: Diagnosis not present

## 2020-08-31 DIAGNOSIS — N186 End stage renal disease: Secondary | ICD-10-CM | POA: Diagnosis not present

## 2020-08-31 DIAGNOSIS — D631 Anemia in chronic kidney disease: Secondary | ICD-10-CM | POA: Diagnosis not present

## 2020-08-31 DIAGNOSIS — N2581 Secondary hyperparathyroidism of renal origin: Secondary | ICD-10-CM | POA: Diagnosis not present

## 2020-09-01 DIAGNOSIS — D631 Anemia in chronic kidney disease: Secondary | ICD-10-CM | POA: Diagnosis not present

## 2020-09-01 DIAGNOSIS — N186 End stage renal disease: Secondary | ICD-10-CM | POA: Diagnosis not present

## 2020-09-01 DIAGNOSIS — N2581 Secondary hyperparathyroidism of renal origin: Secondary | ICD-10-CM | POA: Diagnosis not present

## 2020-09-02 DIAGNOSIS — Z6826 Body mass index (BMI) 26.0-26.9, adult: Secondary | ICD-10-CM | POA: Diagnosis not present

## 2020-09-02 DIAGNOSIS — N186 End stage renal disease: Secondary | ICD-10-CM | POA: Diagnosis not present

## 2020-09-02 DIAGNOSIS — L03114 Cellulitis of left upper limb: Secondary | ICD-10-CM | POA: Diagnosis not present

## 2020-09-02 DIAGNOSIS — F17210 Nicotine dependence, cigarettes, uncomplicated: Secondary | ICD-10-CM | POA: Diagnosis not present

## 2020-09-02 DIAGNOSIS — N2581 Secondary hyperparathyroidism of renal origin: Secondary | ICD-10-CM | POA: Diagnosis not present

## 2020-09-02 DIAGNOSIS — D631 Anemia in chronic kidney disease: Secondary | ICD-10-CM | POA: Diagnosis not present

## 2020-09-03 DIAGNOSIS — N186 End stage renal disease: Secondary | ICD-10-CM | POA: Diagnosis not present

## 2020-09-03 DIAGNOSIS — D631 Anemia in chronic kidney disease: Secondary | ICD-10-CM | POA: Diagnosis not present

## 2020-09-03 DIAGNOSIS — N2581 Secondary hyperparathyroidism of renal origin: Secondary | ICD-10-CM | POA: Diagnosis not present

## 2020-09-04 DIAGNOSIS — N2581 Secondary hyperparathyroidism of renal origin: Secondary | ICD-10-CM | POA: Diagnosis not present

## 2020-09-04 DIAGNOSIS — N186 End stage renal disease: Secondary | ICD-10-CM | POA: Diagnosis not present

## 2020-09-04 DIAGNOSIS — D631 Anemia in chronic kidney disease: Secondary | ICD-10-CM | POA: Diagnosis not present

## 2020-09-05 DIAGNOSIS — L03114 Cellulitis of left upper limb: Secondary | ICD-10-CM | POA: Diagnosis not present

## 2020-09-05 DIAGNOSIS — D631 Anemia in chronic kidney disease: Secondary | ICD-10-CM | POA: Diagnosis not present

## 2020-09-05 DIAGNOSIS — N2581 Secondary hyperparathyroidism of renal origin: Secondary | ICD-10-CM | POA: Diagnosis not present

## 2020-09-05 DIAGNOSIS — N186 End stage renal disease: Secondary | ICD-10-CM | POA: Diagnosis not present

## 2020-09-06 DIAGNOSIS — D631 Anemia in chronic kidney disease: Secondary | ICD-10-CM | POA: Diagnosis not present

## 2020-09-06 DIAGNOSIS — N2581 Secondary hyperparathyroidism of renal origin: Secondary | ICD-10-CM | POA: Diagnosis not present

## 2020-09-06 DIAGNOSIS — N186 End stage renal disease: Secondary | ICD-10-CM | POA: Diagnosis not present

## 2020-09-07 DIAGNOSIS — N2581 Secondary hyperparathyroidism of renal origin: Secondary | ICD-10-CM | POA: Diagnosis not present

## 2020-09-07 DIAGNOSIS — N186 End stage renal disease: Secondary | ICD-10-CM | POA: Diagnosis not present

## 2020-09-07 DIAGNOSIS — D631 Anemia in chronic kidney disease: Secondary | ICD-10-CM | POA: Diagnosis not present

## 2020-09-08 DIAGNOSIS — N186 End stage renal disease: Secondary | ICD-10-CM | POA: Diagnosis not present

## 2020-09-08 DIAGNOSIS — N2581 Secondary hyperparathyroidism of renal origin: Secondary | ICD-10-CM | POA: Diagnosis not present

## 2020-09-08 DIAGNOSIS — D631 Anemia in chronic kidney disease: Secondary | ICD-10-CM | POA: Diagnosis not present

## 2020-09-09 DIAGNOSIS — N186 End stage renal disease: Secondary | ICD-10-CM | POA: Diagnosis not present

## 2020-09-09 DIAGNOSIS — D631 Anemia in chronic kidney disease: Secondary | ICD-10-CM | POA: Diagnosis not present

## 2020-09-09 DIAGNOSIS — N2581 Secondary hyperparathyroidism of renal origin: Secondary | ICD-10-CM | POA: Diagnosis not present

## 2020-09-10 DIAGNOSIS — E46 Unspecified protein-calorie malnutrition: Secondary | ICD-10-CM | POA: Diagnosis not present

## 2020-09-10 DIAGNOSIS — D631 Anemia in chronic kidney disease: Secondary | ICD-10-CM | POA: Diagnosis not present

## 2020-09-10 DIAGNOSIS — E10649 Type 1 diabetes mellitus with hypoglycemia without coma: Secondary | ICD-10-CM | POA: Diagnosis not present

## 2020-09-10 DIAGNOSIS — S41102A Unspecified open wound of left upper arm, initial encounter: Secondary | ICD-10-CM | POA: Diagnosis not present

## 2020-09-10 DIAGNOSIS — N2581 Secondary hyperparathyroidism of renal origin: Secondary | ICD-10-CM | POA: Diagnosis not present

## 2020-09-10 DIAGNOSIS — N186 End stage renal disease: Secondary | ICD-10-CM | POA: Diagnosis not present

## 2020-09-10 DIAGNOSIS — E1022 Type 1 diabetes mellitus with diabetic chronic kidney disease: Secondary | ICD-10-CM | POA: Diagnosis not present

## 2020-09-10 DIAGNOSIS — F1911 Other psychoactive substance abuse, in remission: Secondary | ICD-10-CM | POA: Diagnosis not present

## 2020-09-10 DIAGNOSIS — Z992 Dependence on renal dialysis: Secondary | ICD-10-CM | POA: Diagnosis not present

## 2020-09-11 DIAGNOSIS — D631 Anemia in chronic kidney disease: Secondary | ICD-10-CM | POA: Diagnosis not present

## 2020-09-11 DIAGNOSIS — N186 End stage renal disease: Secondary | ICD-10-CM | POA: Diagnosis not present

## 2020-09-11 DIAGNOSIS — N2581 Secondary hyperparathyroidism of renal origin: Secondary | ICD-10-CM | POA: Diagnosis not present

## 2020-09-11 DIAGNOSIS — E119 Type 2 diabetes mellitus without complications: Secondary | ICD-10-CM | POA: Diagnosis not present

## 2020-09-12 DIAGNOSIS — D631 Anemia in chronic kidney disease: Secondary | ICD-10-CM | POA: Diagnosis not present

## 2020-09-12 DIAGNOSIS — N2581 Secondary hyperparathyroidism of renal origin: Secondary | ICD-10-CM | POA: Diagnosis not present

## 2020-09-12 DIAGNOSIS — N186 End stage renal disease: Secondary | ICD-10-CM | POA: Diagnosis not present

## 2020-09-14 DIAGNOSIS — D631 Anemia in chronic kidney disease: Secondary | ICD-10-CM | POA: Diagnosis not present

## 2020-09-14 DIAGNOSIS — N186 End stage renal disease: Secondary | ICD-10-CM | POA: Diagnosis not present

## 2020-09-14 DIAGNOSIS — N2581 Secondary hyperparathyroidism of renal origin: Secondary | ICD-10-CM | POA: Diagnosis not present

## 2020-09-15 DIAGNOSIS — D631 Anemia in chronic kidney disease: Secondary | ICD-10-CM | POA: Diagnosis not present

## 2020-09-15 DIAGNOSIS — N186 End stage renal disease: Secondary | ICD-10-CM | POA: Diagnosis not present

## 2020-09-15 DIAGNOSIS — N2581 Secondary hyperparathyroidism of renal origin: Secondary | ICD-10-CM | POA: Diagnosis not present

## 2020-09-16 DIAGNOSIS — N186 End stage renal disease: Secondary | ICD-10-CM | POA: Diagnosis not present

## 2020-09-16 DIAGNOSIS — N2581 Secondary hyperparathyroidism of renal origin: Secondary | ICD-10-CM | POA: Diagnosis not present

## 2020-09-16 DIAGNOSIS — D631 Anemia in chronic kidney disease: Secondary | ICD-10-CM | POA: Diagnosis not present

## 2020-09-18 DIAGNOSIS — N186 End stage renal disease: Secondary | ICD-10-CM | POA: Diagnosis not present

## 2020-09-18 DIAGNOSIS — D631 Anemia in chronic kidney disease: Secondary | ICD-10-CM | POA: Diagnosis not present

## 2020-09-18 DIAGNOSIS — N2581 Secondary hyperparathyroidism of renal origin: Secondary | ICD-10-CM | POA: Diagnosis not present

## 2020-09-19 DIAGNOSIS — D631 Anemia in chronic kidney disease: Secondary | ICD-10-CM | POA: Diagnosis not present

## 2020-09-19 DIAGNOSIS — N186 End stage renal disease: Secondary | ICD-10-CM | POA: Diagnosis not present

## 2020-09-19 DIAGNOSIS — N2581 Secondary hyperparathyroidism of renal origin: Secondary | ICD-10-CM | POA: Diagnosis not present

## 2020-09-20 DIAGNOSIS — D631 Anemia in chronic kidney disease: Secondary | ICD-10-CM | POA: Diagnosis not present

## 2020-09-20 DIAGNOSIS — N186 End stage renal disease: Secondary | ICD-10-CM | POA: Diagnosis not present

## 2020-09-20 DIAGNOSIS — N2581 Secondary hyperparathyroidism of renal origin: Secondary | ICD-10-CM | POA: Diagnosis not present

## 2020-09-21 DIAGNOSIS — N2581 Secondary hyperparathyroidism of renal origin: Secondary | ICD-10-CM | POA: Diagnosis not present

## 2020-09-21 DIAGNOSIS — N186 End stage renal disease: Secondary | ICD-10-CM | POA: Diagnosis not present

## 2020-09-21 DIAGNOSIS — D631 Anemia in chronic kidney disease: Secondary | ICD-10-CM | POA: Diagnosis not present

## 2020-09-22 DIAGNOSIS — D631 Anemia in chronic kidney disease: Secondary | ICD-10-CM | POA: Diagnosis not present

## 2020-09-22 DIAGNOSIS — N2581 Secondary hyperparathyroidism of renal origin: Secondary | ICD-10-CM | POA: Diagnosis not present

## 2020-09-22 DIAGNOSIS — N186 End stage renal disease: Secondary | ICD-10-CM | POA: Diagnosis not present

## 2020-09-23 DIAGNOSIS — N186 End stage renal disease: Secondary | ICD-10-CM | POA: Diagnosis not present

## 2020-09-23 DIAGNOSIS — N2581 Secondary hyperparathyroidism of renal origin: Secondary | ICD-10-CM | POA: Diagnosis not present

## 2020-09-23 DIAGNOSIS — D631 Anemia in chronic kidney disease: Secondary | ICD-10-CM | POA: Diagnosis not present

## 2020-09-24 DIAGNOSIS — N2581 Secondary hyperparathyroidism of renal origin: Secondary | ICD-10-CM | POA: Diagnosis not present

## 2020-09-24 DIAGNOSIS — D631 Anemia in chronic kidney disease: Secondary | ICD-10-CM | POA: Diagnosis not present

## 2020-09-24 DIAGNOSIS — N186 End stage renal disease: Secondary | ICD-10-CM | POA: Diagnosis not present

## 2020-09-25 DIAGNOSIS — N2581 Secondary hyperparathyroidism of renal origin: Secondary | ICD-10-CM | POA: Diagnosis not present

## 2020-09-25 DIAGNOSIS — D631 Anemia in chronic kidney disease: Secondary | ICD-10-CM | POA: Diagnosis not present

## 2020-09-25 DIAGNOSIS — N186 End stage renal disease: Secondary | ICD-10-CM | POA: Diagnosis not present

## 2020-09-26 DIAGNOSIS — N186 End stage renal disease: Secondary | ICD-10-CM | POA: Diagnosis not present

## 2020-09-26 DIAGNOSIS — D631 Anemia in chronic kidney disease: Secondary | ICD-10-CM | POA: Diagnosis not present

## 2020-09-26 DIAGNOSIS — N2581 Secondary hyperparathyroidism of renal origin: Secondary | ICD-10-CM | POA: Diagnosis not present

## 2020-09-27 DIAGNOSIS — N2581 Secondary hyperparathyroidism of renal origin: Secondary | ICD-10-CM | POA: Diagnosis not present

## 2020-09-27 DIAGNOSIS — D631 Anemia in chronic kidney disease: Secondary | ICD-10-CM | POA: Diagnosis not present

## 2020-09-27 DIAGNOSIS — N186 End stage renal disease: Secondary | ICD-10-CM | POA: Diagnosis not present

## 2020-09-28 DIAGNOSIS — N186 End stage renal disease: Secondary | ICD-10-CM | POA: Diagnosis not present

## 2020-09-28 DIAGNOSIS — Z992 Dependence on renal dialysis: Secondary | ICD-10-CM | POA: Diagnosis not present

## 2020-09-29 DIAGNOSIS — N186 End stage renal disease: Secondary | ICD-10-CM | POA: Diagnosis not present

## 2020-09-29 DIAGNOSIS — D631 Anemia in chronic kidney disease: Secondary | ICD-10-CM | POA: Diagnosis not present

## 2020-09-29 DIAGNOSIS — Z992 Dependence on renal dialysis: Secondary | ICD-10-CM | POA: Diagnosis not present

## 2020-09-29 DIAGNOSIS — N2581 Secondary hyperparathyroidism of renal origin: Secondary | ICD-10-CM | POA: Diagnosis not present

## 2020-09-30 DIAGNOSIS — N2581 Secondary hyperparathyroidism of renal origin: Secondary | ICD-10-CM | POA: Diagnosis not present

## 2020-09-30 DIAGNOSIS — Z992 Dependence on renal dialysis: Secondary | ICD-10-CM | POA: Diagnosis not present

## 2020-09-30 DIAGNOSIS — D631 Anemia in chronic kidney disease: Secondary | ICD-10-CM | POA: Diagnosis not present

## 2020-09-30 DIAGNOSIS — N186 End stage renal disease: Secondary | ICD-10-CM | POA: Diagnosis not present

## 2020-10-01 DIAGNOSIS — N186 End stage renal disease: Secondary | ICD-10-CM | POA: Diagnosis not present

## 2020-10-01 DIAGNOSIS — Z992 Dependence on renal dialysis: Secondary | ICD-10-CM | POA: Diagnosis not present

## 2020-10-02 DIAGNOSIS — Z992 Dependence on renal dialysis: Secondary | ICD-10-CM | POA: Diagnosis not present

## 2020-10-02 DIAGNOSIS — E119 Type 2 diabetes mellitus without complications: Secondary | ICD-10-CM | POA: Diagnosis not present

## 2020-10-02 DIAGNOSIS — D631 Anemia in chronic kidney disease: Secondary | ICD-10-CM | POA: Diagnosis not present

## 2020-10-02 DIAGNOSIS — N2581 Secondary hyperparathyroidism of renal origin: Secondary | ICD-10-CM | POA: Diagnosis not present

## 2020-10-02 DIAGNOSIS — N186 End stage renal disease: Secondary | ICD-10-CM | POA: Diagnosis not present

## 2020-10-02 DIAGNOSIS — R000 Tachycardia, unspecified: Secondary | ICD-10-CM | POA: Diagnosis not present

## 2020-10-03 DIAGNOSIS — N2581 Secondary hyperparathyroidism of renal origin: Secondary | ICD-10-CM | POA: Diagnosis not present

## 2020-10-03 DIAGNOSIS — D631 Anemia in chronic kidney disease: Secondary | ICD-10-CM | POA: Diagnosis not present

## 2020-10-03 DIAGNOSIS — Z992 Dependence on renal dialysis: Secondary | ICD-10-CM | POA: Diagnosis not present

## 2020-10-03 DIAGNOSIS — E440 Moderate protein-calorie malnutrition: Secondary | ICD-10-CM | POA: Diagnosis not present

## 2020-10-03 DIAGNOSIS — I120 Hypertensive chronic kidney disease with stage 5 chronic kidney disease or end stage renal disease: Secondary | ICD-10-CM | POA: Diagnosis not present

## 2020-10-03 DIAGNOSIS — N186 End stage renal disease: Secondary | ICD-10-CM | POA: Diagnosis not present

## 2020-10-04 DIAGNOSIS — N186 End stage renal disease: Secondary | ICD-10-CM | POA: Diagnosis not present

## 2020-10-04 DIAGNOSIS — D631 Anemia in chronic kidney disease: Secondary | ICD-10-CM | POA: Diagnosis not present

## 2020-10-04 DIAGNOSIS — N2581 Secondary hyperparathyroidism of renal origin: Secondary | ICD-10-CM | POA: Diagnosis not present

## 2020-10-04 DIAGNOSIS — Z992 Dependence on renal dialysis: Secondary | ICD-10-CM | POA: Diagnosis not present

## 2020-10-05 DIAGNOSIS — N2581 Secondary hyperparathyroidism of renal origin: Secondary | ICD-10-CM | POA: Diagnosis not present

## 2020-10-05 DIAGNOSIS — Z992 Dependence on renal dialysis: Secondary | ICD-10-CM | POA: Diagnosis not present

## 2020-10-05 DIAGNOSIS — N186 End stage renal disease: Secondary | ICD-10-CM | POA: Diagnosis not present

## 2020-10-05 DIAGNOSIS — D631 Anemia in chronic kidney disease: Secondary | ICD-10-CM | POA: Diagnosis not present

## 2020-10-06 DIAGNOSIS — Z992 Dependence on renal dialysis: Secondary | ICD-10-CM | POA: Diagnosis not present

## 2020-10-06 DIAGNOSIS — N186 End stage renal disease: Secondary | ICD-10-CM | POA: Diagnosis not present

## 2020-10-07 DIAGNOSIS — D631 Anemia in chronic kidney disease: Secondary | ICD-10-CM | POA: Diagnosis not present

## 2020-10-07 DIAGNOSIS — N2581 Secondary hyperparathyroidism of renal origin: Secondary | ICD-10-CM | POA: Diagnosis not present

## 2020-10-07 DIAGNOSIS — Z992 Dependence on renal dialysis: Secondary | ICD-10-CM | POA: Diagnosis not present

## 2020-10-07 DIAGNOSIS — N186 End stage renal disease: Secondary | ICD-10-CM | POA: Diagnosis not present

## 2020-10-08 DIAGNOSIS — N186 End stage renal disease: Secondary | ICD-10-CM | POA: Diagnosis not present

## 2020-10-08 DIAGNOSIS — D631 Anemia in chronic kidney disease: Secondary | ICD-10-CM | POA: Diagnosis not present

## 2020-10-08 DIAGNOSIS — N2581 Secondary hyperparathyroidism of renal origin: Secondary | ICD-10-CM | POA: Diagnosis not present

## 2020-10-08 DIAGNOSIS — Z992 Dependence on renal dialysis: Secondary | ICD-10-CM | POA: Diagnosis not present

## 2020-10-09 DIAGNOSIS — N186 End stage renal disease: Secondary | ICD-10-CM | POA: Diagnosis not present

## 2020-10-09 DIAGNOSIS — N2581 Secondary hyperparathyroidism of renal origin: Secondary | ICD-10-CM | POA: Diagnosis not present

## 2020-10-09 DIAGNOSIS — Z992 Dependence on renal dialysis: Secondary | ICD-10-CM | POA: Diagnosis not present

## 2020-10-09 DIAGNOSIS — D631 Anemia in chronic kidney disease: Secondary | ICD-10-CM | POA: Diagnosis not present

## 2020-10-10 DIAGNOSIS — D631 Anemia in chronic kidney disease: Secondary | ICD-10-CM | POA: Diagnosis not present

## 2020-10-10 DIAGNOSIS — N186 End stage renal disease: Secondary | ICD-10-CM | POA: Diagnosis not present

## 2020-10-10 DIAGNOSIS — N2581 Secondary hyperparathyroidism of renal origin: Secondary | ICD-10-CM | POA: Diagnosis not present

## 2020-10-10 DIAGNOSIS — Z992 Dependence on renal dialysis: Secondary | ICD-10-CM | POA: Diagnosis not present

## 2020-10-11 DIAGNOSIS — N2581 Secondary hyperparathyroidism of renal origin: Secondary | ICD-10-CM | POA: Diagnosis not present

## 2020-10-11 DIAGNOSIS — Z992 Dependence on renal dialysis: Secondary | ICD-10-CM | POA: Diagnosis not present

## 2020-10-11 DIAGNOSIS — D631 Anemia in chronic kidney disease: Secondary | ICD-10-CM | POA: Diagnosis not present

## 2020-10-11 DIAGNOSIS — N186 End stage renal disease: Secondary | ICD-10-CM | POA: Diagnosis not present

## 2020-10-12 DIAGNOSIS — D631 Anemia in chronic kidney disease: Secondary | ICD-10-CM | POA: Diagnosis not present

## 2020-10-12 DIAGNOSIS — Z992 Dependence on renal dialysis: Secondary | ICD-10-CM | POA: Diagnosis not present

## 2020-10-12 DIAGNOSIS — N186 End stage renal disease: Secondary | ICD-10-CM | POA: Diagnosis not present

## 2020-10-12 DIAGNOSIS — N2581 Secondary hyperparathyroidism of renal origin: Secondary | ICD-10-CM | POA: Diagnosis not present

## 2020-10-13 DIAGNOSIS — N2581 Secondary hyperparathyroidism of renal origin: Secondary | ICD-10-CM | POA: Diagnosis not present

## 2020-10-13 DIAGNOSIS — D631 Anemia in chronic kidney disease: Secondary | ICD-10-CM | POA: Diagnosis not present

## 2020-10-13 DIAGNOSIS — N186 End stage renal disease: Secondary | ICD-10-CM | POA: Diagnosis not present

## 2020-10-13 DIAGNOSIS — Z992 Dependence on renal dialysis: Secondary | ICD-10-CM | POA: Diagnosis not present

## 2020-10-14 DIAGNOSIS — N186 End stage renal disease: Secondary | ICD-10-CM | POA: Diagnosis not present

## 2020-10-14 DIAGNOSIS — Z992 Dependence on renal dialysis: Secondary | ICD-10-CM | POA: Diagnosis not present

## 2020-10-15 DIAGNOSIS — N2581 Secondary hyperparathyroidism of renal origin: Secondary | ICD-10-CM | POA: Diagnosis not present

## 2020-10-15 DIAGNOSIS — Z992 Dependence on renal dialysis: Secondary | ICD-10-CM | POA: Diagnosis not present

## 2020-10-15 DIAGNOSIS — N186 End stage renal disease: Secondary | ICD-10-CM | POA: Diagnosis not present

## 2020-10-15 DIAGNOSIS — S41102A Unspecified open wound of left upper arm, initial encounter: Secondary | ICD-10-CM | POA: Diagnosis not present

## 2020-10-15 DIAGNOSIS — D631 Anemia in chronic kidney disease: Secondary | ICD-10-CM | POA: Diagnosis not present

## 2020-10-15 DIAGNOSIS — S41102D Unspecified open wound of left upper arm, subsequent encounter: Secondary | ICD-10-CM | POA: Diagnosis not present

## 2020-10-16 DIAGNOSIS — Z992 Dependence on renal dialysis: Secondary | ICD-10-CM | POA: Diagnosis not present

## 2020-10-16 DIAGNOSIS — N186 End stage renal disease: Secondary | ICD-10-CM | POA: Diagnosis not present

## 2020-10-17 DIAGNOSIS — N186 End stage renal disease: Secondary | ICD-10-CM | POA: Diagnosis not present

## 2020-10-17 DIAGNOSIS — Z992 Dependence on renal dialysis: Secondary | ICD-10-CM | POA: Diagnosis not present

## 2020-10-17 DIAGNOSIS — N2581 Secondary hyperparathyroidism of renal origin: Secondary | ICD-10-CM | POA: Diagnosis not present

## 2020-10-17 DIAGNOSIS — D631 Anemia in chronic kidney disease: Secondary | ICD-10-CM | POA: Diagnosis not present

## 2020-10-18 DIAGNOSIS — Z992 Dependence on renal dialysis: Secondary | ICD-10-CM | POA: Diagnosis not present

## 2020-10-18 DIAGNOSIS — N186 End stage renal disease: Secondary | ICD-10-CM | POA: Diagnosis not present

## 2020-10-18 DIAGNOSIS — D631 Anemia in chronic kidney disease: Secondary | ICD-10-CM | POA: Diagnosis not present

## 2020-10-18 DIAGNOSIS — N2581 Secondary hyperparathyroidism of renal origin: Secondary | ICD-10-CM | POA: Diagnosis not present

## 2020-10-19 DIAGNOSIS — N2581 Secondary hyperparathyroidism of renal origin: Secondary | ICD-10-CM | POA: Diagnosis not present

## 2020-10-19 DIAGNOSIS — Z992 Dependence on renal dialysis: Secondary | ICD-10-CM | POA: Diagnosis not present

## 2020-10-19 DIAGNOSIS — D631 Anemia in chronic kidney disease: Secondary | ICD-10-CM | POA: Diagnosis not present

## 2020-10-19 DIAGNOSIS — N186 End stage renal disease: Secondary | ICD-10-CM | POA: Diagnosis not present

## 2020-10-20 DIAGNOSIS — Z992 Dependence on renal dialysis: Secondary | ICD-10-CM | POA: Diagnosis not present

## 2020-10-20 DIAGNOSIS — D631 Anemia in chronic kidney disease: Secondary | ICD-10-CM | POA: Diagnosis not present

## 2020-10-20 DIAGNOSIS — N2581 Secondary hyperparathyroidism of renal origin: Secondary | ICD-10-CM | POA: Diagnosis not present

## 2020-10-20 DIAGNOSIS — N186 End stage renal disease: Secondary | ICD-10-CM | POA: Diagnosis not present

## 2020-10-21 DIAGNOSIS — Z992 Dependence on renal dialysis: Secondary | ICD-10-CM | POA: Diagnosis not present

## 2020-10-21 DIAGNOSIS — N186 End stage renal disease: Secondary | ICD-10-CM | POA: Diagnosis not present

## 2020-10-21 DIAGNOSIS — D631 Anemia in chronic kidney disease: Secondary | ICD-10-CM | POA: Diagnosis not present

## 2020-10-21 DIAGNOSIS — N2581 Secondary hyperparathyroidism of renal origin: Secondary | ICD-10-CM | POA: Diagnosis not present

## 2020-10-22 DIAGNOSIS — Z992 Dependence on renal dialysis: Secondary | ICD-10-CM | POA: Diagnosis not present

## 2020-10-22 DIAGNOSIS — N186 End stage renal disease: Secondary | ICD-10-CM | POA: Diagnosis not present

## 2020-10-23 DIAGNOSIS — N2581 Secondary hyperparathyroidism of renal origin: Secondary | ICD-10-CM | POA: Diagnosis not present

## 2020-10-23 DIAGNOSIS — Z992 Dependence on renal dialysis: Secondary | ICD-10-CM | POA: Diagnosis not present

## 2020-10-23 DIAGNOSIS — N186 End stage renal disease: Secondary | ICD-10-CM | POA: Diagnosis not present

## 2020-10-23 DIAGNOSIS — D631 Anemia in chronic kidney disease: Secondary | ICD-10-CM | POA: Diagnosis not present

## 2020-10-24 DIAGNOSIS — N2581 Secondary hyperparathyroidism of renal origin: Secondary | ICD-10-CM | POA: Diagnosis not present

## 2020-10-24 DIAGNOSIS — Z992 Dependence on renal dialysis: Secondary | ICD-10-CM | POA: Diagnosis not present

## 2020-10-24 DIAGNOSIS — N186 End stage renal disease: Secondary | ICD-10-CM | POA: Diagnosis not present

## 2020-10-24 DIAGNOSIS — D631 Anemia in chronic kidney disease: Secondary | ICD-10-CM | POA: Diagnosis not present

## 2020-10-25 DIAGNOSIS — Z992 Dependence on renal dialysis: Secondary | ICD-10-CM | POA: Diagnosis not present

## 2020-10-25 DIAGNOSIS — N186 End stage renal disease: Secondary | ICD-10-CM | POA: Diagnosis not present

## 2020-10-25 DIAGNOSIS — D631 Anemia in chronic kidney disease: Secondary | ICD-10-CM | POA: Diagnosis not present

## 2020-10-25 DIAGNOSIS — N2581 Secondary hyperparathyroidism of renal origin: Secondary | ICD-10-CM | POA: Diagnosis not present

## 2020-10-26 DIAGNOSIS — N2581 Secondary hyperparathyroidism of renal origin: Secondary | ICD-10-CM | POA: Diagnosis not present

## 2020-10-26 DIAGNOSIS — N186 End stage renal disease: Secondary | ICD-10-CM | POA: Diagnosis not present

## 2020-10-26 DIAGNOSIS — Z992 Dependence on renal dialysis: Secondary | ICD-10-CM | POA: Diagnosis not present

## 2020-10-26 DIAGNOSIS — D631 Anemia in chronic kidney disease: Secondary | ICD-10-CM | POA: Diagnosis not present

## 2020-10-27 DIAGNOSIS — N2581 Secondary hyperparathyroidism of renal origin: Secondary | ICD-10-CM | POA: Diagnosis not present

## 2020-10-27 DIAGNOSIS — N186 End stage renal disease: Secondary | ICD-10-CM | POA: Diagnosis not present

## 2020-10-27 DIAGNOSIS — Z992 Dependence on renal dialysis: Secondary | ICD-10-CM | POA: Diagnosis not present

## 2020-10-27 DIAGNOSIS — D631 Anemia in chronic kidney disease: Secondary | ICD-10-CM | POA: Diagnosis not present

## 2020-10-28 DIAGNOSIS — N2581 Secondary hyperparathyroidism of renal origin: Secondary | ICD-10-CM | POA: Diagnosis not present

## 2020-10-28 DIAGNOSIS — D631 Anemia in chronic kidney disease: Secondary | ICD-10-CM | POA: Diagnosis not present

## 2020-10-28 DIAGNOSIS — N186 End stage renal disease: Secondary | ICD-10-CM | POA: Diagnosis not present

## 2020-10-28 DIAGNOSIS — Z992 Dependence on renal dialysis: Secondary | ICD-10-CM | POA: Diagnosis not present

## 2020-10-29 DIAGNOSIS — N2581 Secondary hyperparathyroidism of renal origin: Secondary | ICD-10-CM | POA: Diagnosis not present

## 2020-10-29 DIAGNOSIS — Z992 Dependence on renal dialysis: Secondary | ICD-10-CM | POA: Diagnosis not present

## 2020-10-29 DIAGNOSIS — N186 End stage renal disease: Secondary | ICD-10-CM | POA: Diagnosis not present

## 2020-10-29 DIAGNOSIS — D631 Anemia in chronic kidney disease: Secondary | ICD-10-CM | POA: Diagnosis not present

## 2020-10-30 DIAGNOSIS — N2581 Secondary hyperparathyroidism of renal origin: Secondary | ICD-10-CM | POA: Diagnosis not present

## 2020-10-30 DIAGNOSIS — D631 Anemia in chronic kidney disease: Secondary | ICD-10-CM | POA: Diagnosis not present

## 2020-10-30 DIAGNOSIS — N186 End stage renal disease: Secondary | ICD-10-CM | POA: Diagnosis not present

## 2020-10-31 DIAGNOSIS — N186 End stage renal disease: Secondary | ICD-10-CM | POA: Diagnosis not present

## 2020-10-31 DIAGNOSIS — D631 Anemia in chronic kidney disease: Secondary | ICD-10-CM | POA: Diagnosis not present

## 2020-10-31 DIAGNOSIS — N2581 Secondary hyperparathyroidism of renal origin: Secondary | ICD-10-CM | POA: Diagnosis not present

## 2020-11-01 DIAGNOSIS — N2581 Secondary hyperparathyroidism of renal origin: Secondary | ICD-10-CM | POA: Diagnosis not present

## 2020-11-01 DIAGNOSIS — D631 Anemia in chronic kidney disease: Secondary | ICD-10-CM | POA: Diagnosis not present

## 2020-11-01 DIAGNOSIS — N186 End stage renal disease: Secondary | ICD-10-CM | POA: Diagnosis not present

## 2020-11-02 DIAGNOSIS — D631 Anemia in chronic kidney disease: Secondary | ICD-10-CM | POA: Diagnosis not present

## 2020-11-02 DIAGNOSIS — N2581 Secondary hyperparathyroidism of renal origin: Secondary | ICD-10-CM | POA: Diagnosis not present

## 2020-11-02 DIAGNOSIS — N186 End stage renal disease: Secondary | ICD-10-CM | POA: Diagnosis not present

## 2020-11-03 DIAGNOSIS — N2581 Secondary hyperparathyroidism of renal origin: Secondary | ICD-10-CM | POA: Diagnosis not present

## 2020-11-03 DIAGNOSIS — D631 Anemia in chronic kidney disease: Secondary | ICD-10-CM | POA: Diagnosis not present

## 2020-11-03 DIAGNOSIS — N186 End stage renal disease: Secondary | ICD-10-CM | POA: Diagnosis not present

## 2020-11-04 DIAGNOSIS — N2581 Secondary hyperparathyroidism of renal origin: Secondary | ICD-10-CM | POA: Diagnosis not present

## 2020-11-04 DIAGNOSIS — N186 End stage renal disease: Secondary | ICD-10-CM | POA: Diagnosis not present

## 2020-11-04 DIAGNOSIS — D631 Anemia in chronic kidney disease: Secondary | ICD-10-CM | POA: Diagnosis not present

## 2020-11-05 DIAGNOSIS — N186 End stage renal disease: Secondary | ICD-10-CM | POA: Diagnosis not present

## 2020-11-05 DIAGNOSIS — D631 Anemia in chronic kidney disease: Secondary | ICD-10-CM | POA: Diagnosis not present

## 2020-11-05 DIAGNOSIS — N2581 Secondary hyperparathyroidism of renal origin: Secondary | ICD-10-CM | POA: Diagnosis not present

## 2020-11-06 DIAGNOSIS — E119 Type 2 diabetes mellitus without complications: Secondary | ICD-10-CM | POA: Diagnosis not present

## 2020-11-06 DIAGNOSIS — D631 Anemia in chronic kidney disease: Secondary | ICD-10-CM | POA: Diagnosis not present

## 2020-11-06 DIAGNOSIS — N186 End stage renal disease: Secondary | ICD-10-CM | POA: Diagnosis not present

## 2020-11-06 DIAGNOSIS — N2581 Secondary hyperparathyroidism of renal origin: Secondary | ICD-10-CM | POA: Diagnosis not present

## 2020-11-08 DIAGNOSIS — N186 End stage renal disease: Secondary | ICD-10-CM | POA: Diagnosis not present

## 2020-11-08 DIAGNOSIS — D631 Anemia in chronic kidney disease: Secondary | ICD-10-CM | POA: Diagnosis not present

## 2020-11-08 DIAGNOSIS — N2581 Secondary hyperparathyroidism of renal origin: Secondary | ICD-10-CM | POA: Diagnosis not present

## 2020-11-09 DIAGNOSIS — N2581 Secondary hyperparathyroidism of renal origin: Secondary | ICD-10-CM | POA: Diagnosis not present

## 2020-11-09 DIAGNOSIS — N186 End stage renal disease: Secondary | ICD-10-CM | POA: Diagnosis not present

## 2020-11-09 DIAGNOSIS — D631 Anemia in chronic kidney disease: Secondary | ICD-10-CM | POA: Diagnosis not present

## 2020-11-10 DIAGNOSIS — D631 Anemia in chronic kidney disease: Secondary | ICD-10-CM | POA: Diagnosis not present

## 2020-11-10 DIAGNOSIS — N2581 Secondary hyperparathyroidism of renal origin: Secondary | ICD-10-CM | POA: Diagnosis not present

## 2020-11-10 DIAGNOSIS — N186 End stage renal disease: Secondary | ICD-10-CM | POA: Diagnosis not present

## 2020-11-13 DIAGNOSIS — N2581 Secondary hyperparathyroidism of renal origin: Secondary | ICD-10-CM | POA: Diagnosis not present

## 2020-11-13 DIAGNOSIS — N186 End stage renal disease: Secondary | ICD-10-CM | POA: Diagnosis not present

## 2020-11-13 DIAGNOSIS — D631 Anemia in chronic kidney disease: Secondary | ICD-10-CM | POA: Diagnosis not present

## 2020-11-14 DIAGNOSIS — N186 End stage renal disease: Secondary | ICD-10-CM | POA: Diagnosis not present

## 2020-11-14 DIAGNOSIS — N2581 Secondary hyperparathyroidism of renal origin: Secondary | ICD-10-CM | POA: Diagnosis not present

## 2020-11-14 DIAGNOSIS — D631 Anemia in chronic kidney disease: Secondary | ICD-10-CM | POA: Diagnosis not present

## 2020-11-15 DIAGNOSIS — N2581 Secondary hyperparathyroidism of renal origin: Secondary | ICD-10-CM | POA: Diagnosis not present

## 2020-11-15 DIAGNOSIS — N186 End stage renal disease: Secondary | ICD-10-CM | POA: Diagnosis not present

## 2020-11-15 DIAGNOSIS — D631 Anemia in chronic kidney disease: Secondary | ICD-10-CM | POA: Diagnosis not present

## 2020-11-17 DIAGNOSIS — N2581 Secondary hyperparathyroidism of renal origin: Secondary | ICD-10-CM | POA: Diagnosis not present

## 2020-11-17 DIAGNOSIS — D631 Anemia in chronic kidney disease: Secondary | ICD-10-CM | POA: Diagnosis not present

## 2020-11-17 DIAGNOSIS — N186 End stage renal disease: Secondary | ICD-10-CM | POA: Diagnosis not present

## 2020-11-18 DIAGNOSIS — E10649 Type 1 diabetes mellitus with hypoglycemia without coma: Secondary | ICD-10-CM | POA: Diagnosis not present

## 2020-11-18 DIAGNOSIS — N186 End stage renal disease: Secondary | ICD-10-CM | POA: Diagnosis not present

## 2020-11-18 DIAGNOSIS — Z992 Dependence on renal dialysis: Secondary | ICD-10-CM | POA: Diagnosis not present

## 2020-11-18 DIAGNOSIS — E1022 Type 1 diabetes mellitus with diabetic chronic kidney disease: Secondary | ICD-10-CM | POA: Diagnosis not present

## 2020-11-18 DIAGNOSIS — S41102D Unspecified open wound of left upper arm, subsequent encounter: Secondary | ICD-10-CM | POA: Diagnosis not present

## 2020-11-18 DIAGNOSIS — N2581 Secondary hyperparathyroidism of renal origin: Secondary | ICD-10-CM | POA: Diagnosis not present

## 2020-11-18 DIAGNOSIS — D631 Anemia in chronic kidney disease: Secondary | ICD-10-CM | POA: Diagnosis not present

## 2020-11-18 DIAGNOSIS — S41102A Unspecified open wound of left upper arm, initial encounter: Secondary | ICD-10-CM | POA: Diagnosis not present

## 2020-11-18 DIAGNOSIS — E46 Unspecified protein-calorie malnutrition: Secondary | ICD-10-CM | POA: Diagnosis not present

## 2020-11-20 DIAGNOSIS — N186 End stage renal disease: Secondary | ICD-10-CM | POA: Diagnosis not present

## 2020-11-20 DIAGNOSIS — N2581 Secondary hyperparathyroidism of renal origin: Secondary | ICD-10-CM | POA: Diagnosis not present

## 2020-11-20 DIAGNOSIS — D631 Anemia in chronic kidney disease: Secondary | ICD-10-CM | POA: Diagnosis not present

## 2020-11-21 DIAGNOSIS — D631 Anemia in chronic kidney disease: Secondary | ICD-10-CM | POA: Diagnosis not present

## 2020-11-21 DIAGNOSIS — N2581 Secondary hyperparathyroidism of renal origin: Secondary | ICD-10-CM | POA: Diagnosis not present

## 2020-11-21 DIAGNOSIS — N186 End stage renal disease: Secondary | ICD-10-CM | POA: Diagnosis not present

## 2020-11-22 DIAGNOSIS — D631 Anemia in chronic kidney disease: Secondary | ICD-10-CM | POA: Diagnosis not present

## 2020-11-22 DIAGNOSIS — N186 End stage renal disease: Secondary | ICD-10-CM | POA: Diagnosis not present

## 2020-11-22 DIAGNOSIS — N2581 Secondary hyperparathyroidism of renal origin: Secondary | ICD-10-CM | POA: Diagnosis not present

## 2020-11-24 DIAGNOSIS — D631 Anemia in chronic kidney disease: Secondary | ICD-10-CM | POA: Diagnosis not present

## 2020-11-24 DIAGNOSIS — N186 End stage renal disease: Secondary | ICD-10-CM | POA: Diagnosis not present

## 2020-11-24 DIAGNOSIS — N2581 Secondary hyperparathyroidism of renal origin: Secondary | ICD-10-CM | POA: Diagnosis not present

## 2020-11-25 DIAGNOSIS — N186 End stage renal disease: Secondary | ICD-10-CM | POA: Diagnosis not present

## 2020-11-25 DIAGNOSIS — D631 Anemia in chronic kidney disease: Secondary | ICD-10-CM | POA: Diagnosis not present

## 2020-11-25 DIAGNOSIS — N2581 Secondary hyperparathyroidism of renal origin: Secondary | ICD-10-CM | POA: Diagnosis not present

## 2020-11-26 DIAGNOSIS — N2581 Secondary hyperparathyroidism of renal origin: Secondary | ICD-10-CM | POA: Diagnosis not present

## 2020-11-26 DIAGNOSIS — D631 Anemia in chronic kidney disease: Secondary | ICD-10-CM | POA: Diagnosis not present

## 2020-11-26 DIAGNOSIS — N186 End stage renal disease: Secondary | ICD-10-CM | POA: Diagnosis not present

## 2020-11-27 DIAGNOSIS — N186 End stage renal disease: Secondary | ICD-10-CM | POA: Diagnosis not present

## 2020-11-27 DIAGNOSIS — D631 Anemia in chronic kidney disease: Secondary | ICD-10-CM | POA: Diagnosis not present

## 2020-11-27 DIAGNOSIS — N2581 Secondary hyperparathyroidism of renal origin: Secondary | ICD-10-CM | POA: Diagnosis not present

## 2020-11-28 DIAGNOSIS — Z992 Dependence on renal dialysis: Secondary | ICD-10-CM | POA: Diagnosis not present

## 2020-11-28 DIAGNOSIS — N186 End stage renal disease: Secondary | ICD-10-CM | POA: Diagnosis not present

## 2020-11-29 DIAGNOSIS — D631 Anemia in chronic kidney disease: Secondary | ICD-10-CM | POA: Diagnosis not present

## 2020-11-29 DIAGNOSIS — N2581 Secondary hyperparathyroidism of renal origin: Secondary | ICD-10-CM | POA: Diagnosis not present

## 2020-11-29 DIAGNOSIS — N186 End stage renal disease: Secondary | ICD-10-CM | POA: Diagnosis not present

## 2020-12-01 DIAGNOSIS — N2581 Secondary hyperparathyroidism of renal origin: Secondary | ICD-10-CM | POA: Diagnosis not present

## 2020-12-01 DIAGNOSIS — N186 End stage renal disease: Secondary | ICD-10-CM | POA: Diagnosis not present

## 2020-12-01 DIAGNOSIS — D631 Anemia in chronic kidney disease: Secondary | ICD-10-CM | POA: Diagnosis not present

## 2020-12-03 DIAGNOSIS — D631 Anemia in chronic kidney disease: Secondary | ICD-10-CM | POA: Diagnosis not present

## 2020-12-03 DIAGNOSIS — N2581 Secondary hyperparathyroidism of renal origin: Secondary | ICD-10-CM | POA: Diagnosis not present

## 2020-12-03 DIAGNOSIS — N186 End stage renal disease: Secondary | ICD-10-CM | POA: Diagnosis not present

## 2020-12-04 DIAGNOSIS — D631 Anemia in chronic kidney disease: Secondary | ICD-10-CM | POA: Diagnosis not present

## 2020-12-04 DIAGNOSIS — N186 End stage renal disease: Secondary | ICD-10-CM | POA: Diagnosis not present

## 2020-12-04 DIAGNOSIS — E119 Type 2 diabetes mellitus without complications: Secondary | ICD-10-CM | POA: Diagnosis not present

## 2020-12-04 DIAGNOSIS — N2581 Secondary hyperparathyroidism of renal origin: Secondary | ICD-10-CM | POA: Diagnosis not present

## 2020-12-05 DIAGNOSIS — N2581 Secondary hyperparathyroidism of renal origin: Secondary | ICD-10-CM | POA: Diagnosis not present

## 2020-12-05 DIAGNOSIS — N186 End stage renal disease: Secondary | ICD-10-CM | POA: Diagnosis not present

## 2020-12-05 DIAGNOSIS — D631 Anemia in chronic kidney disease: Secondary | ICD-10-CM | POA: Diagnosis not present

## 2020-12-06 DIAGNOSIS — N2581 Secondary hyperparathyroidism of renal origin: Secondary | ICD-10-CM | POA: Diagnosis not present

## 2020-12-06 DIAGNOSIS — D631 Anemia in chronic kidney disease: Secondary | ICD-10-CM | POA: Diagnosis not present

## 2020-12-06 DIAGNOSIS — N186 End stage renal disease: Secondary | ICD-10-CM | POA: Diagnosis not present

## 2020-12-08 DIAGNOSIS — N2581 Secondary hyperparathyroidism of renal origin: Secondary | ICD-10-CM | POA: Diagnosis not present

## 2020-12-08 DIAGNOSIS — D631 Anemia in chronic kidney disease: Secondary | ICD-10-CM | POA: Diagnosis not present

## 2020-12-08 DIAGNOSIS — N186 End stage renal disease: Secondary | ICD-10-CM | POA: Diagnosis not present

## 2020-12-09 DIAGNOSIS — N186 End stage renal disease: Secondary | ICD-10-CM | POA: Diagnosis not present

## 2020-12-09 DIAGNOSIS — D631 Anemia in chronic kidney disease: Secondary | ICD-10-CM | POA: Diagnosis not present

## 2020-12-09 DIAGNOSIS — N2581 Secondary hyperparathyroidism of renal origin: Secondary | ICD-10-CM | POA: Diagnosis not present

## 2020-12-10 DIAGNOSIS — N186 End stage renal disease: Secondary | ICD-10-CM | POA: Diagnosis not present

## 2020-12-10 DIAGNOSIS — N2581 Secondary hyperparathyroidism of renal origin: Secondary | ICD-10-CM | POA: Diagnosis not present

## 2020-12-10 DIAGNOSIS — D631 Anemia in chronic kidney disease: Secondary | ICD-10-CM | POA: Diagnosis not present

## 2020-12-11 DIAGNOSIS — N186 End stage renal disease: Secondary | ICD-10-CM | POA: Diagnosis not present

## 2020-12-11 DIAGNOSIS — N2581 Secondary hyperparathyroidism of renal origin: Secondary | ICD-10-CM | POA: Diagnosis not present

## 2020-12-11 DIAGNOSIS — D631 Anemia in chronic kidney disease: Secondary | ICD-10-CM | POA: Diagnosis not present

## 2020-12-13 DIAGNOSIS — D631 Anemia in chronic kidney disease: Secondary | ICD-10-CM | POA: Diagnosis not present

## 2020-12-13 DIAGNOSIS — N2581 Secondary hyperparathyroidism of renal origin: Secondary | ICD-10-CM | POA: Diagnosis not present

## 2020-12-13 DIAGNOSIS — N186 End stage renal disease: Secondary | ICD-10-CM | POA: Diagnosis not present

## 2020-12-15 DIAGNOSIS — N186 End stage renal disease: Secondary | ICD-10-CM | POA: Diagnosis not present

## 2020-12-15 DIAGNOSIS — D631 Anemia in chronic kidney disease: Secondary | ICD-10-CM | POA: Diagnosis not present

## 2020-12-15 DIAGNOSIS — N2581 Secondary hyperparathyroidism of renal origin: Secondary | ICD-10-CM | POA: Diagnosis not present

## 2020-12-16 DIAGNOSIS — D631 Anemia in chronic kidney disease: Secondary | ICD-10-CM | POA: Diagnosis not present

## 2020-12-16 DIAGNOSIS — N186 End stage renal disease: Secondary | ICD-10-CM | POA: Diagnosis not present

## 2020-12-16 DIAGNOSIS — N2581 Secondary hyperparathyroidism of renal origin: Secondary | ICD-10-CM | POA: Diagnosis not present

## 2020-12-17 DIAGNOSIS — D631 Anemia in chronic kidney disease: Secondary | ICD-10-CM | POA: Diagnosis not present

## 2020-12-17 DIAGNOSIS — N186 End stage renal disease: Secondary | ICD-10-CM | POA: Diagnosis not present

## 2020-12-17 DIAGNOSIS — N2581 Secondary hyperparathyroidism of renal origin: Secondary | ICD-10-CM | POA: Diagnosis not present

## 2020-12-20 DIAGNOSIS — N2581 Secondary hyperparathyroidism of renal origin: Secondary | ICD-10-CM | POA: Diagnosis not present

## 2020-12-20 DIAGNOSIS — D631 Anemia in chronic kidney disease: Secondary | ICD-10-CM | POA: Diagnosis not present

## 2020-12-20 DIAGNOSIS — N186 End stage renal disease: Secondary | ICD-10-CM | POA: Diagnosis not present

## 2020-12-23 DIAGNOSIS — N186 End stage renal disease: Secondary | ICD-10-CM | POA: Diagnosis not present

## 2020-12-23 DIAGNOSIS — D631 Anemia in chronic kidney disease: Secondary | ICD-10-CM | POA: Diagnosis not present

## 2020-12-23 DIAGNOSIS — N2581 Secondary hyperparathyroidism of renal origin: Secondary | ICD-10-CM | POA: Diagnosis not present

## 2020-12-26 DIAGNOSIS — N2581 Secondary hyperparathyroidism of renal origin: Secondary | ICD-10-CM | POA: Diagnosis not present

## 2020-12-26 DIAGNOSIS — N186 End stage renal disease: Secondary | ICD-10-CM | POA: Diagnosis not present

## 2020-12-26 DIAGNOSIS — D631 Anemia in chronic kidney disease: Secondary | ICD-10-CM | POA: Diagnosis not present

## 2020-12-28 DIAGNOSIS — D631 Anemia in chronic kidney disease: Secondary | ICD-10-CM | POA: Diagnosis not present

## 2020-12-28 DIAGNOSIS — N2581 Secondary hyperparathyroidism of renal origin: Secondary | ICD-10-CM | POA: Diagnosis not present

## 2020-12-28 DIAGNOSIS — N186 End stage renal disease: Secondary | ICD-10-CM | POA: Diagnosis not present

## 2020-12-29 DIAGNOSIS — N186 End stage renal disease: Secondary | ICD-10-CM | POA: Diagnosis not present

## 2020-12-29 DIAGNOSIS — Z992 Dependence on renal dialysis: Secondary | ICD-10-CM | POA: Diagnosis not present

## 2021-01-01 DIAGNOSIS — N186 End stage renal disease: Secondary | ICD-10-CM | POA: Diagnosis not present

## 2021-01-01 DIAGNOSIS — N2581 Secondary hyperparathyroidism of renal origin: Secondary | ICD-10-CM | POA: Diagnosis not present

## 2021-01-01 DIAGNOSIS — E119 Type 2 diabetes mellitus without complications: Secondary | ICD-10-CM | POA: Diagnosis not present

## 2021-01-01 DIAGNOSIS — D631 Anemia in chronic kidney disease: Secondary | ICD-10-CM | POA: Diagnosis not present

## 2021-01-02 DIAGNOSIS — D631 Anemia in chronic kidney disease: Secondary | ICD-10-CM | POA: Diagnosis not present

## 2021-01-02 DIAGNOSIS — N186 End stage renal disease: Secondary | ICD-10-CM | POA: Diagnosis not present

## 2021-01-02 DIAGNOSIS — N2581 Secondary hyperparathyroidism of renal origin: Secondary | ICD-10-CM | POA: Diagnosis not present

## 2021-01-03 DIAGNOSIS — N2581 Secondary hyperparathyroidism of renal origin: Secondary | ICD-10-CM | POA: Diagnosis not present

## 2021-01-03 DIAGNOSIS — D631 Anemia in chronic kidney disease: Secondary | ICD-10-CM | POA: Diagnosis not present

## 2021-01-03 DIAGNOSIS — N186 End stage renal disease: Secondary | ICD-10-CM | POA: Diagnosis not present

## 2021-01-04 DIAGNOSIS — N2581 Secondary hyperparathyroidism of renal origin: Secondary | ICD-10-CM | POA: Diagnosis not present

## 2021-01-04 DIAGNOSIS — N186 End stage renal disease: Secondary | ICD-10-CM | POA: Diagnosis not present

## 2021-01-04 DIAGNOSIS — D631 Anemia in chronic kidney disease: Secondary | ICD-10-CM | POA: Diagnosis not present

## 2021-01-05 DIAGNOSIS — N186 End stage renal disease: Secondary | ICD-10-CM | POA: Diagnosis not present

## 2021-01-05 DIAGNOSIS — N2581 Secondary hyperparathyroidism of renal origin: Secondary | ICD-10-CM | POA: Diagnosis not present

## 2021-01-05 DIAGNOSIS — D631 Anemia in chronic kidney disease: Secondary | ICD-10-CM | POA: Diagnosis not present

## 2021-01-06 DIAGNOSIS — D631 Anemia in chronic kidney disease: Secondary | ICD-10-CM | POA: Diagnosis not present

## 2021-01-06 DIAGNOSIS — N186 End stage renal disease: Secondary | ICD-10-CM | POA: Diagnosis not present

## 2021-01-06 DIAGNOSIS — N2581 Secondary hyperparathyroidism of renal origin: Secondary | ICD-10-CM | POA: Diagnosis not present

## 2021-01-07 DIAGNOSIS — N186 End stage renal disease: Secondary | ICD-10-CM | POA: Diagnosis not present

## 2021-01-07 DIAGNOSIS — D631 Anemia in chronic kidney disease: Secondary | ICD-10-CM | POA: Diagnosis not present

## 2021-01-07 DIAGNOSIS — N2581 Secondary hyperparathyroidism of renal origin: Secondary | ICD-10-CM | POA: Diagnosis not present

## 2021-01-07 DIAGNOSIS — M792 Neuralgia and neuritis, unspecified: Secondary | ICD-10-CM | POA: Diagnosis not present

## 2021-01-08 DIAGNOSIS — N2581 Secondary hyperparathyroidism of renal origin: Secondary | ICD-10-CM | POA: Diagnosis not present

## 2021-01-08 DIAGNOSIS — D631 Anemia in chronic kidney disease: Secondary | ICD-10-CM | POA: Diagnosis not present

## 2021-01-08 DIAGNOSIS — N186 End stage renal disease: Secondary | ICD-10-CM | POA: Diagnosis not present

## 2021-01-09 DIAGNOSIS — N2581 Secondary hyperparathyroidism of renal origin: Secondary | ICD-10-CM | POA: Diagnosis not present

## 2021-01-09 DIAGNOSIS — D631 Anemia in chronic kidney disease: Secondary | ICD-10-CM | POA: Diagnosis not present

## 2021-01-09 DIAGNOSIS — N186 End stage renal disease: Secondary | ICD-10-CM | POA: Diagnosis not present

## 2021-01-10 DIAGNOSIS — D631 Anemia in chronic kidney disease: Secondary | ICD-10-CM | POA: Diagnosis not present

## 2021-01-10 DIAGNOSIS — N186 End stage renal disease: Secondary | ICD-10-CM | POA: Diagnosis not present

## 2021-01-10 DIAGNOSIS — N2581 Secondary hyperparathyroidism of renal origin: Secondary | ICD-10-CM | POA: Diagnosis not present

## 2021-01-11 DIAGNOSIS — N2581 Secondary hyperparathyroidism of renal origin: Secondary | ICD-10-CM | POA: Diagnosis not present

## 2021-01-11 DIAGNOSIS — D631 Anemia in chronic kidney disease: Secondary | ICD-10-CM | POA: Diagnosis not present

## 2021-01-11 DIAGNOSIS — N186 End stage renal disease: Secondary | ICD-10-CM | POA: Diagnosis not present

## 2021-01-12 DIAGNOSIS — N186 End stage renal disease: Secondary | ICD-10-CM | POA: Diagnosis not present

## 2021-01-12 DIAGNOSIS — D631 Anemia in chronic kidney disease: Secondary | ICD-10-CM | POA: Diagnosis not present

## 2021-01-12 DIAGNOSIS — N2581 Secondary hyperparathyroidism of renal origin: Secondary | ICD-10-CM | POA: Diagnosis not present

## 2021-01-13 DIAGNOSIS — N2581 Secondary hyperparathyroidism of renal origin: Secondary | ICD-10-CM | POA: Diagnosis not present

## 2021-01-13 DIAGNOSIS — N186 End stage renal disease: Secondary | ICD-10-CM | POA: Diagnosis not present

## 2021-01-13 DIAGNOSIS — D631 Anemia in chronic kidney disease: Secondary | ICD-10-CM | POA: Diagnosis not present

## 2021-01-14 DIAGNOSIS — N186 End stage renal disease: Secondary | ICD-10-CM | POA: Diagnosis not present

## 2021-01-14 DIAGNOSIS — D631 Anemia in chronic kidney disease: Secondary | ICD-10-CM | POA: Diagnosis not present

## 2021-01-14 DIAGNOSIS — N2581 Secondary hyperparathyroidism of renal origin: Secondary | ICD-10-CM | POA: Diagnosis not present

## 2021-01-15 DIAGNOSIS — D631 Anemia in chronic kidney disease: Secondary | ICD-10-CM | POA: Diagnosis not present

## 2021-01-15 DIAGNOSIS — N186 End stage renal disease: Secondary | ICD-10-CM | POA: Diagnosis not present

## 2021-01-15 DIAGNOSIS — N2581 Secondary hyperparathyroidism of renal origin: Secondary | ICD-10-CM | POA: Diagnosis not present

## 2021-01-16 DIAGNOSIS — N186 End stage renal disease: Secondary | ICD-10-CM | POA: Diagnosis not present

## 2021-01-16 DIAGNOSIS — N2581 Secondary hyperparathyroidism of renal origin: Secondary | ICD-10-CM | POA: Diagnosis not present

## 2021-01-16 DIAGNOSIS — D631 Anemia in chronic kidney disease: Secondary | ICD-10-CM | POA: Diagnosis not present

## 2021-01-17 DIAGNOSIS — N186 End stage renal disease: Secondary | ICD-10-CM | POA: Diagnosis not present

## 2021-01-17 DIAGNOSIS — D631 Anemia in chronic kidney disease: Secondary | ICD-10-CM | POA: Diagnosis not present

## 2021-01-17 DIAGNOSIS — N2581 Secondary hyperparathyroidism of renal origin: Secondary | ICD-10-CM | POA: Diagnosis not present

## 2021-01-18 DIAGNOSIS — N2581 Secondary hyperparathyroidism of renal origin: Secondary | ICD-10-CM | POA: Diagnosis not present

## 2021-01-18 DIAGNOSIS — N186 End stage renal disease: Secondary | ICD-10-CM | POA: Diagnosis not present

## 2021-01-18 DIAGNOSIS — D631 Anemia in chronic kidney disease: Secondary | ICD-10-CM | POA: Diagnosis not present

## 2021-01-19 DIAGNOSIS — N186 End stage renal disease: Secondary | ICD-10-CM | POA: Diagnosis not present

## 2021-01-19 DIAGNOSIS — N2581 Secondary hyperparathyroidism of renal origin: Secondary | ICD-10-CM | POA: Diagnosis not present

## 2021-01-19 DIAGNOSIS — D631 Anemia in chronic kidney disease: Secondary | ICD-10-CM | POA: Diagnosis not present

## 2021-01-20 DIAGNOSIS — D631 Anemia in chronic kidney disease: Secondary | ICD-10-CM | POA: Diagnosis not present

## 2021-01-20 DIAGNOSIS — N2581 Secondary hyperparathyroidism of renal origin: Secondary | ICD-10-CM | POA: Diagnosis not present

## 2021-01-20 DIAGNOSIS — N186 End stage renal disease: Secondary | ICD-10-CM | POA: Diagnosis not present

## 2021-01-21 DIAGNOSIS — D631 Anemia in chronic kidney disease: Secondary | ICD-10-CM | POA: Diagnosis not present

## 2021-01-21 DIAGNOSIS — N2581 Secondary hyperparathyroidism of renal origin: Secondary | ICD-10-CM | POA: Diagnosis not present

## 2021-01-21 DIAGNOSIS — N186 End stage renal disease: Secondary | ICD-10-CM | POA: Diagnosis not present

## 2021-01-22 DIAGNOSIS — N2581 Secondary hyperparathyroidism of renal origin: Secondary | ICD-10-CM | POA: Diagnosis not present

## 2021-01-22 DIAGNOSIS — N186 End stage renal disease: Secondary | ICD-10-CM | POA: Diagnosis not present

## 2021-01-22 DIAGNOSIS — D631 Anemia in chronic kidney disease: Secondary | ICD-10-CM | POA: Diagnosis not present

## 2021-01-23 DIAGNOSIS — N2581 Secondary hyperparathyroidism of renal origin: Secondary | ICD-10-CM | POA: Diagnosis not present

## 2021-01-23 DIAGNOSIS — D631 Anemia in chronic kidney disease: Secondary | ICD-10-CM | POA: Diagnosis not present

## 2021-01-23 DIAGNOSIS — N186 End stage renal disease: Secondary | ICD-10-CM | POA: Diagnosis not present

## 2021-01-24 DIAGNOSIS — N2581 Secondary hyperparathyroidism of renal origin: Secondary | ICD-10-CM | POA: Diagnosis not present

## 2021-01-24 DIAGNOSIS — D631 Anemia in chronic kidney disease: Secondary | ICD-10-CM | POA: Diagnosis not present

## 2021-01-24 DIAGNOSIS — N186 End stage renal disease: Secondary | ICD-10-CM | POA: Diagnosis not present

## 2021-01-25 DIAGNOSIS — D631 Anemia in chronic kidney disease: Secondary | ICD-10-CM | POA: Diagnosis not present

## 2021-01-25 DIAGNOSIS — N186 End stage renal disease: Secondary | ICD-10-CM | POA: Diagnosis not present

## 2021-01-25 DIAGNOSIS — N2581 Secondary hyperparathyroidism of renal origin: Secondary | ICD-10-CM | POA: Diagnosis not present

## 2021-01-26 DIAGNOSIS — D631 Anemia in chronic kidney disease: Secondary | ICD-10-CM | POA: Diagnosis not present

## 2021-01-26 DIAGNOSIS — N186 End stage renal disease: Secondary | ICD-10-CM | POA: Diagnosis not present

## 2021-01-26 DIAGNOSIS — N2581 Secondary hyperparathyroidism of renal origin: Secondary | ICD-10-CM | POA: Diagnosis not present

## 2021-01-27 DIAGNOSIS — N186 End stage renal disease: Secondary | ICD-10-CM | POA: Diagnosis not present

## 2021-01-27 DIAGNOSIS — N2581 Secondary hyperparathyroidism of renal origin: Secondary | ICD-10-CM | POA: Diagnosis not present

## 2021-01-27 DIAGNOSIS — D631 Anemia in chronic kidney disease: Secondary | ICD-10-CM | POA: Diagnosis not present

## 2021-01-28 DIAGNOSIS — D631 Anemia in chronic kidney disease: Secondary | ICD-10-CM | POA: Diagnosis not present

## 2021-01-28 DIAGNOSIS — N186 End stage renal disease: Secondary | ICD-10-CM | POA: Diagnosis not present

## 2021-01-28 DIAGNOSIS — N2581 Secondary hyperparathyroidism of renal origin: Secondary | ICD-10-CM | POA: Diagnosis not present

## 2021-01-28 DIAGNOSIS — Z992 Dependence on renal dialysis: Secondary | ICD-10-CM | POA: Diagnosis not present

## 2021-01-29 DIAGNOSIS — N186 End stage renal disease: Secondary | ICD-10-CM | POA: Diagnosis not present

## 2021-01-29 DIAGNOSIS — N2581 Secondary hyperparathyroidism of renal origin: Secondary | ICD-10-CM | POA: Diagnosis not present

## 2021-01-30 DIAGNOSIS — N2581 Secondary hyperparathyroidism of renal origin: Secondary | ICD-10-CM | POA: Diagnosis not present

## 2021-01-30 DIAGNOSIS — N186 End stage renal disease: Secondary | ICD-10-CM | POA: Diagnosis not present

## 2021-01-31 DIAGNOSIS — N2581 Secondary hyperparathyroidism of renal origin: Secondary | ICD-10-CM | POA: Diagnosis not present

## 2021-01-31 DIAGNOSIS — N186 End stage renal disease: Secondary | ICD-10-CM | POA: Diagnosis not present

## 2021-02-01 DIAGNOSIS — N186 End stage renal disease: Secondary | ICD-10-CM | POA: Diagnosis not present

## 2021-02-01 DIAGNOSIS — N2581 Secondary hyperparathyroidism of renal origin: Secondary | ICD-10-CM | POA: Diagnosis not present

## 2021-02-02 DIAGNOSIS — N2581 Secondary hyperparathyroidism of renal origin: Secondary | ICD-10-CM | POA: Diagnosis not present

## 2021-02-02 DIAGNOSIS — N186 End stage renal disease: Secondary | ICD-10-CM | POA: Diagnosis not present

## 2021-02-03 DIAGNOSIS — N186 End stage renal disease: Secondary | ICD-10-CM | POA: Diagnosis not present

## 2021-02-03 DIAGNOSIS — N2581 Secondary hyperparathyroidism of renal origin: Secondary | ICD-10-CM | POA: Diagnosis not present

## 2021-02-04 DIAGNOSIS — R0902 Hypoxemia: Secondary | ICD-10-CM | POA: Diagnosis not present

## 2021-02-04 DIAGNOSIS — D72829 Elevated white blood cell count, unspecified: Secondary | ICD-10-CM | POA: Diagnosis not present

## 2021-02-04 DIAGNOSIS — N186 End stage renal disease: Secondary | ICD-10-CM | POA: Diagnosis not present

## 2021-02-04 DIAGNOSIS — Z992 Dependence on renal dialysis: Secondary | ICD-10-CM | POA: Diagnosis not present

## 2021-02-04 DIAGNOSIS — Z20822 Contact with and (suspected) exposure to covid-19: Secondary | ICD-10-CM | POA: Diagnosis not present

## 2021-02-04 DIAGNOSIS — E875 Hyperkalemia: Secondary | ICD-10-CM | POA: Diagnosis not present

## 2021-02-04 DIAGNOSIS — L97921 Non-pressure chronic ulcer of unspecified part of left lower leg limited to breakdown of skin: Secondary | ICD-10-CM | POA: Diagnosis not present

## 2021-02-04 DIAGNOSIS — I517 Cardiomegaly: Secondary | ICD-10-CM | POA: Diagnosis not present

## 2021-02-04 DIAGNOSIS — J810 Acute pulmonary edema: Secondary | ICD-10-CM | POA: Diagnosis not present

## 2021-02-04 DIAGNOSIS — M25562 Pain in left knee: Secondary | ICD-10-CM | POA: Diagnosis not present

## 2021-02-04 DIAGNOSIS — J90 Pleural effusion, not elsewhere classified: Secondary | ICD-10-CM | POA: Diagnosis not present

## 2021-02-04 DIAGNOSIS — A419 Sepsis, unspecified organism: Secondary | ICD-10-CM | POA: Diagnosis not present

## 2021-02-05 DIAGNOSIS — R579 Shock, unspecified: Secondary | ICD-10-CM | POA: Diagnosis not present

## 2021-02-05 DIAGNOSIS — I509 Heart failure, unspecified: Secondary | ICD-10-CM | POA: Diagnosis not present

## 2021-02-05 DIAGNOSIS — S31000D Unspecified open wound of lower back and pelvis without penetration into retroperitoneum, subsequent encounter: Secondary | ICD-10-CM | POA: Diagnosis not present

## 2021-02-05 DIAGNOSIS — I132 Hypertensive heart and chronic kidney disease with heart failure and with stage 5 chronic kidney disease, or end stage renal disease: Secondary | ICD-10-CM | POA: Diagnosis not present

## 2021-02-05 DIAGNOSIS — L89159 Pressure ulcer of sacral region, unspecified stage: Secondary | ICD-10-CM | POA: Diagnosis not present

## 2021-02-05 DIAGNOSIS — E876 Hypokalemia: Secondary | ICD-10-CM | POA: Diagnosis not present

## 2021-02-05 DIAGNOSIS — L97211 Non-pressure chronic ulcer of right calf limited to breakdown of skin: Secondary | ICD-10-CM | POA: Diagnosis not present

## 2021-02-05 DIAGNOSIS — L97921 Non-pressure chronic ulcer of unspecified part of left lower leg limited to breakdown of skin: Secondary | ICD-10-CM | POA: Diagnosis not present

## 2021-02-05 DIAGNOSIS — R079 Chest pain, unspecified: Secondary | ICD-10-CM | POA: Diagnosis not present

## 2021-02-05 DIAGNOSIS — N186 End stage renal disease: Secondary | ICD-10-CM | POA: Diagnosis not present

## 2021-02-05 DIAGNOSIS — R000 Tachycardia, unspecified: Secondary | ICD-10-CM | POA: Diagnosis not present

## 2021-02-05 DIAGNOSIS — I5020 Unspecified systolic (congestive) heart failure: Secondary | ICD-10-CM | POA: Diagnosis not present

## 2021-02-05 DIAGNOSIS — N189 Chronic kidney disease, unspecified: Secondary | ICD-10-CM | POA: Diagnosis not present

## 2021-02-05 DIAGNOSIS — M25562 Pain in left knee: Secondary | ICD-10-CM | POA: Diagnosis not present

## 2021-02-05 DIAGNOSIS — E43 Unspecified severe protein-calorie malnutrition: Secondary | ICD-10-CM | POA: Diagnosis not present

## 2021-02-05 DIAGNOSIS — L98421 Non-pressure chronic ulcer of back limited to breakdown of skin: Secondary | ICD-10-CM | POA: Diagnosis not present

## 2021-02-05 DIAGNOSIS — J690 Pneumonitis due to inhalation of food and vomit: Secondary | ICD-10-CM | POA: Diagnosis not present

## 2021-02-05 DIAGNOSIS — L97221 Non-pressure chronic ulcer of left calf limited to breakdown of skin: Secondary | ICD-10-CM | POA: Diagnosis not present

## 2021-02-05 DIAGNOSIS — B957 Other staphylococcus as the cause of diseases classified elsewhere: Secondary | ICD-10-CM | POA: Diagnosis not present

## 2021-02-05 DIAGNOSIS — K659 Peritonitis, unspecified: Secondary | ICD-10-CM | POA: Diagnosis not present

## 2021-02-05 DIAGNOSIS — I120 Hypertensive chronic kidney disease with stage 5 chronic kidney disease or end stage renal disease: Secondary | ICD-10-CM | POA: Diagnosis not present

## 2021-02-05 DIAGNOSIS — K658 Other peritonitis: Secondary | ICD-10-CM | POA: Diagnosis not present

## 2021-02-05 DIAGNOSIS — L97411 Non-pressure chronic ulcer of right heel and midfoot limited to breakdown of skin: Secondary | ICD-10-CM | POA: Diagnosis not present

## 2021-02-05 DIAGNOSIS — L98491 Non-pressure chronic ulcer of skin of other sites limited to breakdown of skin: Secondary | ICD-10-CM | POA: Diagnosis not present

## 2021-02-05 DIAGNOSIS — Z992 Dependence on renal dialysis: Secondary | ICD-10-CM | POA: Diagnosis not present

## 2021-02-06 DIAGNOSIS — I5020 Unspecified systolic (congestive) heart failure: Secondary | ICD-10-CM | POA: Diagnosis not present

## 2021-02-06 DIAGNOSIS — N186 End stage renal disease: Secondary | ICD-10-CM | POA: Diagnosis not present

## 2021-02-06 DIAGNOSIS — R9431 Abnormal electrocardiogram [ECG] [EKG]: Secondary | ICD-10-CM | POA: Diagnosis not present

## 2021-02-06 DIAGNOSIS — R109 Unspecified abdominal pain: Secondary | ICD-10-CM | POA: Diagnosis not present

## 2021-02-06 DIAGNOSIS — D62 Acute posthemorrhagic anemia: Secondary | ICD-10-CM | POA: Diagnosis not present

## 2021-02-06 DIAGNOSIS — I132 Hypertensive heart and chronic kidney disease with heart failure and with stage 5 chronic kidney disease, or end stage renal disease: Secondary | ICD-10-CM | POA: Diagnosis not present

## 2021-02-06 DIAGNOSIS — J690 Pneumonitis due to inhalation of food and vomit: Secondary | ICD-10-CM | POA: Diagnosis not present

## 2021-02-06 DIAGNOSIS — R579 Shock, unspecified: Secondary | ICD-10-CM | POA: Diagnosis not present

## 2021-02-06 DIAGNOSIS — D638 Anemia in other chronic diseases classified elsewhere: Secondary | ICD-10-CM | POA: Diagnosis not present

## 2021-02-06 DIAGNOSIS — A419 Sepsis, unspecified organism: Secondary | ICD-10-CM | POA: Diagnosis not present

## 2021-02-06 DIAGNOSIS — E876 Hypokalemia: Secondary | ICD-10-CM | POA: Diagnosis not present

## 2021-02-06 DIAGNOSIS — R000 Tachycardia, unspecified: Secondary | ICD-10-CM | POA: Diagnosis not present

## 2021-02-06 DIAGNOSIS — N2581 Secondary hyperparathyroidism of renal origin: Secondary | ICD-10-CM | POA: Diagnosis not present

## 2021-02-06 DIAGNOSIS — D631 Anemia in chronic kidney disease: Secondary | ICD-10-CM | POA: Diagnosis not present

## 2021-02-06 DIAGNOSIS — Z992 Dependence on renal dialysis: Secondary | ICD-10-CM | POA: Diagnosis not present

## 2021-02-07 DIAGNOSIS — N186 End stage renal disease: Secondary | ICD-10-CM | POA: Diagnosis not present

## 2021-02-07 DIAGNOSIS — Z992 Dependence on renal dialysis: Secondary | ICD-10-CM | POA: Diagnosis not present

## 2021-02-08 DIAGNOSIS — Z992 Dependence on renal dialysis: Secondary | ICD-10-CM | POA: Diagnosis not present

## 2021-02-08 DIAGNOSIS — N186 End stage renal disease: Secondary | ICD-10-CM | POA: Diagnosis not present

## 2021-02-09 DIAGNOSIS — R000 Tachycardia, unspecified: Secondary | ICD-10-CM | POA: Diagnosis not present

## 2021-02-10 DIAGNOSIS — N186 End stage renal disease: Secondary | ICD-10-CM | POA: Diagnosis not present

## 2021-02-10 DIAGNOSIS — Z992 Dependence on renal dialysis: Secondary | ICD-10-CM | POA: Diagnosis not present

## 2021-02-12 DIAGNOSIS — N186 End stage renal disease: Secondary | ICD-10-CM | POA: Diagnosis not present

## 2021-02-12 DIAGNOSIS — Z4902 Encounter for fitting and adjustment of peritoneal dialysis catheter: Secondary | ICD-10-CM | POA: Diagnosis not present

## 2021-02-12 DIAGNOSIS — K659 Peritonitis, unspecified: Secondary | ICD-10-CM | POA: Diagnosis not present

## 2021-02-13 DIAGNOSIS — N765 Ulceration of vagina: Secondary | ICD-10-CM | POA: Diagnosis not present

## 2021-02-13 DIAGNOSIS — I132 Hypertensive heart and chronic kidney disease with heart failure and with stage 5 chronic kidney disease, or end stage renal disease: Secondary | ICD-10-CM | POA: Diagnosis not present

## 2021-02-13 DIAGNOSIS — N186 End stage renal disease: Secondary | ICD-10-CM | POA: Diagnosis not present

## 2021-02-13 DIAGNOSIS — Z992 Dependence on renal dialysis: Secondary | ICD-10-CM | POA: Diagnosis not present

## 2021-02-13 DIAGNOSIS — L89159 Pressure ulcer of sacral region, unspecified stage: Secondary | ICD-10-CM | POA: Diagnosis not present

## 2021-02-13 DIAGNOSIS — I5020 Unspecified systolic (congestive) heart failure: Secondary | ICD-10-CM | POA: Diagnosis not present

## 2021-02-13 DIAGNOSIS — R6520 Severe sepsis without septic shock: Secondary | ICD-10-CM | POA: Diagnosis not present

## 2021-02-13 DIAGNOSIS — L98419 Non-pressure chronic ulcer of buttock with unspecified severity: Secondary | ICD-10-CM | POA: Diagnosis not present

## 2021-02-13 DIAGNOSIS — R0902 Hypoxemia: Secondary | ICD-10-CM | POA: Diagnosis not present

## 2021-02-13 DIAGNOSIS — A419 Sepsis, unspecified organism: Secondary | ICD-10-CM | POA: Diagnosis not present

## 2021-02-13 DIAGNOSIS — E43 Unspecified severe protein-calorie malnutrition: Secondary | ICD-10-CM | POA: Diagnosis not present

## 2021-02-13 DIAGNOSIS — L97921 Non-pressure chronic ulcer of unspecified part of left lower leg limited to breakdown of skin: Secondary | ICD-10-CM | POA: Diagnosis not present

## 2021-02-14 DIAGNOSIS — D631 Anemia in chronic kidney disease: Secondary | ICD-10-CM | POA: Diagnosis not present

## 2021-02-14 DIAGNOSIS — Z992 Dependence on renal dialysis: Secondary | ICD-10-CM | POA: Diagnosis not present

## 2021-02-14 DIAGNOSIS — D509 Iron deficiency anemia, unspecified: Secondary | ICD-10-CM | POA: Diagnosis not present

## 2021-02-14 DIAGNOSIS — E785 Hyperlipidemia, unspecified: Secondary | ICD-10-CM | POA: Diagnosis not present

## 2021-02-14 DIAGNOSIS — N186 End stage renal disease: Secondary | ICD-10-CM | POA: Diagnosis not present

## 2021-02-17 DIAGNOSIS — F17210 Nicotine dependence, cigarettes, uncomplicated: Secondary | ICD-10-CM | POA: Diagnosis not present

## 2021-02-17 DIAGNOSIS — J90 Pleural effusion, not elsewhere classified: Secondary | ICD-10-CM | POA: Diagnosis not present

## 2021-02-17 DIAGNOSIS — R079 Chest pain, unspecified: Secondary | ICD-10-CM | POA: Diagnosis not present

## 2021-02-17 DIAGNOSIS — D631 Anemia in chronic kidney disease: Secondary | ICD-10-CM | POA: Diagnosis not present

## 2021-02-17 DIAGNOSIS — E440 Moderate protein-calorie malnutrition: Secondary | ICD-10-CM | POA: Diagnosis not present

## 2021-02-17 DIAGNOSIS — R112 Nausea with vomiting, unspecified: Secondary | ICD-10-CM | POA: Diagnosis not present

## 2021-02-17 DIAGNOSIS — I959 Hypotension, unspecified: Secondary | ICD-10-CM | POA: Diagnosis not present

## 2021-02-17 DIAGNOSIS — N186 End stage renal disease: Secondary | ICD-10-CM | POA: Diagnosis not present

## 2021-02-17 DIAGNOSIS — R55 Syncope and collapse: Secondary | ICD-10-CM | POA: Diagnosis not present

## 2021-02-17 DIAGNOSIS — D509 Iron deficiency anemia, unspecified: Secondary | ICD-10-CM | POA: Diagnosis not present

## 2021-02-17 DIAGNOSIS — Z992 Dependence on renal dialysis: Secondary | ICD-10-CM | POA: Diagnosis not present

## 2021-02-18 ENCOUNTER — Telehealth: Payer: Self-pay

## 2021-02-18 DIAGNOSIS — R188 Other ascites: Secondary | ICD-10-CM | POA: Diagnosis not present

## 2021-02-18 NOTE — Telephone Encounter (Signed)
..  Home Health Certification or Plan of Care Tracking  Is this a Certification or Plan of Care? POC  [facility] Agency: Latricia Heft  Order Number:  [id number]  Has charge sheet been attached? yes  Where has form been placed:   Andy's box

## 2021-02-19 NOTE — Telephone Encounter (Signed)
Noted  

## 2021-02-20 ENCOUNTER — Telehealth: Payer: Self-pay

## 2021-02-20 NOTE — Telephone Encounter (Signed)
error 

## 2021-02-20 NOTE — Telephone Encounter (Deleted)
..  Home Health Certification or Plan of Care Tracking  Is this a Certification or Plan of Care?   [place] Agency:    Order Number:    Has charge sheet been attached?  Where has form been placed:

## 2021-02-21 DIAGNOSIS — Z992 Dependence on renal dialysis: Secondary | ICD-10-CM | POA: Diagnosis not present

## 2021-02-21 DIAGNOSIS — D509 Iron deficiency anemia, unspecified: Secondary | ICD-10-CM | POA: Diagnosis not present

## 2021-02-21 DIAGNOSIS — D631 Anemia in chronic kidney disease: Secondary | ICD-10-CM | POA: Diagnosis not present

## 2021-02-21 DIAGNOSIS — N186 End stage renal disease: Secondary | ICD-10-CM | POA: Diagnosis not present

## 2021-02-24 DIAGNOSIS — D631 Anemia in chronic kidney disease: Secondary | ICD-10-CM | POA: Diagnosis not present

## 2021-02-24 DIAGNOSIS — D509 Iron deficiency anemia, unspecified: Secondary | ICD-10-CM | POA: Diagnosis not present

## 2021-02-24 DIAGNOSIS — N186 End stage renal disease: Secondary | ICD-10-CM | POA: Diagnosis not present

## 2021-02-24 DIAGNOSIS — Z992 Dependence on renal dialysis: Secondary | ICD-10-CM | POA: Diagnosis not present

## 2021-02-25 DIAGNOSIS — N898 Other specified noninflammatory disorders of vagina: Secondary | ICD-10-CM | POA: Diagnosis not present

## 2021-02-25 DIAGNOSIS — N186 End stage renal disease: Secondary | ICD-10-CM | POA: Diagnosis not present

## 2021-02-25 DIAGNOSIS — L732 Hidradenitis suppurativa: Secondary | ICD-10-CM | POA: Diagnosis not present

## 2021-02-26 ENCOUNTER — Other Ambulatory Visit: Payer: Self-pay

## 2021-02-26 ENCOUNTER — Encounter (HOSPITAL_COMMUNITY): Payer: Self-pay | Admitting: *Deleted

## 2021-02-26 ENCOUNTER — Emergency Department (HOSPITAL_COMMUNITY): Payer: Medicare Other

## 2021-02-26 ENCOUNTER — Observation Stay (HOSPITAL_COMMUNITY)
Admission: EM | Admit: 2021-02-26 | Discharge: 2021-02-27 | Disposition: A | Discharge status: Home / Self Care | Payer: Medicare Other | Attending: Internal Medicine | Admitting: Internal Medicine

## 2021-02-26 DIAGNOSIS — Z79899 Other long term (current) drug therapy: Secondary | ICD-10-CM | POA: Diagnosis not present

## 2021-02-26 DIAGNOSIS — E10649 Type 1 diabetes mellitus with hypoglycemia without coma: Secondary | ICD-10-CM | POA: Diagnosis not present

## 2021-02-26 DIAGNOSIS — D631 Anemia in chronic kidney disease: Secondary | ICD-10-CM | POA: Diagnosis not present

## 2021-02-26 DIAGNOSIS — R109 Unspecified abdominal pain: Secondary | ICD-10-CM | POA: Diagnosis not present

## 2021-02-26 DIAGNOSIS — D649 Anemia, unspecified: Secondary | ICD-10-CM | POA: Diagnosis not present

## 2021-02-26 DIAGNOSIS — E11649 Type 2 diabetes mellitus with hypoglycemia without coma: Secondary | ICD-10-CM | POA: Diagnosis not present

## 2021-02-26 DIAGNOSIS — I953 Hypotension of hemodialysis: Secondary | ICD-10-CM

## 2021-02-26 DIAGNOSIS — R0902 Hypoxemia: Secondary | ICD-10-CM | POA: Diagnosis not present

## 2021-02-26 DIAGNOSIS — Z992 Dependence on renal dialysis: Secondary | ICD-10-CM | POA: Diagnosis not present

## 2021-02-26 DIAGNOSIS — D509 Iron deficiency anemia, unspecified: Secondary | ICD-10-CM | POA: Diagnosis not present

## 2021-02-26 DIAGNOSIS — E162 Hypoglycemia, unspecified: Secondary | ICD-10-CM | POA: Diagnosis present

## 2021-02-26 DIAGNOSIS — Z743 Need for continuous supervision: Secondary | ICD-10-CM | POA: Diagnosis not present

## 2021-02-26 DIAGNOSIS — Z20822 Contact with and (suspected) exposure to covid-19: Secondary | ICD-10-CM | POA: Diagnosis not present

## 2021-02-26 DIAGNOSIS — J90 Pleural effusion, not elsewhere classified: Secondary | ICD-10-CM | POA: Insufficient documentation

## 2021-02-26 DIAGNOSIS — R140 Abdominal distension (gaseous): Secondary | ICD-10-CM | POA: Diagnosis not present

## 2021-02-26 DIAGNOSIS — N186 End stage renal disease: Secondary | ICD-10-CM | POA: Diagnosis not present

## 2021-02-26 DIAGNOSIS — I120 Hypertensive chronic kidney disease with stage 5 chronic kidney disease or end stage renal disease: Secondary | ICD-10-CM | POA: Diagnosis not present

## 2021-02-26 DIAGNOSIS — R000 Tachycardia, unspecified: Secondary | ICD-10-CM | POA: Diagnosis not present

## 2021-02-26 DIAGNOSIS — F17210 Nicotine dependence, cigarettes, uncomplicated: Secondary | ICD-10-CM | POA: Insufficient documentation

## 2021-02-26 DIAGNOSIS — R112 Nausea with vomiting, unspecified: Secondary | ICD-10-CM | POA: Diagnosis not present

## 2021-02-26 DIAGNOSIS — R1110 Vomiting, unspecified: Secondary | ICD-10-CM | POA: Diagnosis not present

## 2021-02-26 DIAGNOSIS — R739 Hyperglycemia, unspecified: Secondary | ICD-10-CM | POA: Diagnosis not present

## 2021-02-26 DIAGNOSIS — I959 Hypotension, unspecified: Secondary | ICD-10-CM | POA: Diagnosis not present

## 2021-02-26 DIAGNOSIS — L8990 Pressure ulcer of unspecified site, unspecified stage: Secondary | ICD-10-CM | POA: Diagnosis present

## 2021-02-26 DIAGNOSIS — R1032 Left lower quadrant pain: Secondary | ICD-10-CM | POA: Diagnosis not present

## 2021-02-26 DIAGNOSIS — E43 Unspecified severe protein-calorie malnutrition: Secondary | ICD-10-CM | POA: Diagnosis present

## 2021-02-26 LAB — CBC WITH DIFFERENTIAL/PLATELET
Band Neutrophils: 2 %
Basophils Relative: 0 %
Eosinophils Relative: 0 %
HCT: 22.2 % — ABNORMAL LOW (ref 36.0–46.0)
Hemoglobin: 6.3 g/dL — CL (ref 12.0–15.0)
Lymphocytes Relative: 24 %
MCHC: 28.4 g/dL — ABNORMAL LOW (ref 30.0–36.0)
MCV: 95.3 fL (ref 80.0–100.0)
Monocytes Relative: 1 %
Neutrophils Relative %: 73 %
Platelets: 229 10*3/uL (ref 150–400)
RBC: 2.33 MIL/uL — ABNORMAL LOW (ref 3.87–5.11)
RDW: 18.4 % — ABNORMAL HIGH (ref 11.5–15.5)
WBC: 6.5 10*3/uL (ref 4.0–10.5)

## 2021-02-26 LAB — COMPREHENSIVE METABOLIC PANEL
ALT: 6 U/L (ref 0–44)
AST: 16 U/L (ref 15–41)
Albumin: <1.5 g/dL — ABNORMAL LOW (ref 3.5–5.0)
Alkaline Phosphatase: 77 U/L (ref 38–126)
Anion gap: 12 (ref 5–15)
BUN: 16 mg/dL (ref 6–20)
CO2: 24 mmol/L (ref 22–32)
Calcium: 8.1 mg/dL — ABNORMAL LOW (ref 8.9–10.3)
Chloride: 99 mmol/L (ref 98–111)
Creatinine, Ser: 3.14 mg/dL — ABNORMAL HIGH (ref 0.44–1.00)
GFR, Estimated: 20 mL/min — ABNORMAL LOW (ref >60)
Glucose, Bld: 136 mg/dL — ABNORMAL HIGH (ref 70–99)
Potassium: 4.2 mmol/L (ref 3.5–5.1)
Sodium: 135 mmol/L (ref 135–145)
Total Bilirubin: 0.5 mg/dL (ref 0.3–1.2)
Total Protein: 5.7 g/dL — ABNORMAL LOW (ref 6.5–8.1)

## 2021-02-26 LAB — CBG MONITORING, ED
Glucose-Capillary: 54 mg/dL — ABNORMAL LOW (ref 70–99)
Glucose-Capillary: 87 mg/dL (ref 70–99)
Glucose-Capillary: 77 mg/dL (ref 70–99)
Glucose-Capillary: 112 mg/dL — ABNORMAL HIGH (ref 70–99)
Glucose-Capillary: 59 mg/dL — ABNORMAL LOW (ref 70–99)
Glucose-Capillary: 45 mg/dL — ABNORMAL LOW (ref 70–99)

## 2021-02-26 LAB — RESP PANEL BY RT-PCR (FLU A&B, COVID) ARPGX2
Influenza A by PCR: NEGATIVE
Influenza B by PCR: NEGATIVE
SARS Coronavirus 2 by RT PCR: NEGATIVE

## 2021-02-26 LAB — HCG, QUANTITATIVE, PREGNANCY: hCG, Beta Chain, Quant, S: 40 m[IU]/mL — ABNORMAL HIGH (ref <5)

## 2021-02-26 LAB — ABO/RH: ABO/RH(D): B POS

## 2021-02-26 LAB — PREPARE RBC (CROSSMATCH)

## 2021-02-26 LAB — GLUCOSE, CAPILLARY
Glucose-Capillary: 66 mg/dL — ABNORMAL LOW (ref 70–99)
Glucose-Capillary: 77 mg/dL (ref 70–99)
Glucose-Capillary: 78 mg/dL (ref 70–99)

## 2021-02-26 LAB — MRSA NEXT GEN BY PCR, NASAL: MRSA by PCR Next Gen: NOT DETECTED

## 2021-02-26 LAB — POC OCCULT BLOOD, ED: Fecal Occult Bld: POSITIVE — AB

## 2021-02-26 LAB — HEMOGLOBIN AND HEMATOCRIT, BLOOD
HCT: 23.8 % — ABNORMAL LOW (ref 36.0–46.0)
Hemoglobin: 7.1 g/dL — ABNORMAL LOW (ref 12.0–15.0)

## 2021-02-26 LAB — LIPASE, BLOOD: Lipase: 19 U/L (ref 11–51)

## 2021-02-26 MED ORDER — DEXTROSE 50 % IV SOLN: 1.0000 | Freq: Once | INTRAVENOUS | Status: AC

## 2021-02-26 MED ORDER — DEXTROSE 50 % IV SOLN
INTRAVENOUS | Status: AC
  Administered 2021-02-26: 13:00:00 50 mL via INTRAVENOUS
  Filled 2021-02-26: qty 50

## 2021-02-26 MED ORDER — METOCLOPRAMIDE HCL 5 MG/ML IJ SOLN
5.0000 mg | Freq: Four times a day (QID) | INTRAMUSCULAR | Status: DC | PRN
  Administered 2021-02-26 – 2021-02-27 (×2): 5 mg via INTRAVENOUS
  Filled 2021-02-26 (×2): qty 2

## 2021-02-26 MED ORDER — BUPRENORPHINE HCL-NALOXONE HCL 8-2 MG SL SUBL
1.0000 | SUBLINGUAL_TABLET | Freq: Every day | SUBLINGUAL | Status: DC
  Administered 2021-02-26 – 2021-02-27 (×2): 1 via SUBLINGUAL
  Filled 2021-02-26 (×2): qty 1

## 2021-02-26 MED ORDER — ONDANSETRON HCL 4 MG PO TABS
4.0000 mg | ORAL_TABLET | Freq: Four times a day (QID) | ORAL | Status: DC | PRN
  Administered 2021-02-26: 21:00:00 4 mg via ORAL
  Filled 2021-02-26: qty 1

## 2021-02-26 MED ORDER — DEXTROSE 50 % IV SOLN
INTRAVENOUS | Status: AC
  Administered 2021-02-26: 10:00:00 50 mL via INTRAVENOUS
  Filled 2021-02-26: qty 50

## 2021-02-26 MED ORDER — SODIUM CHLORIDE 0.9 % IV BOLUS: 500.0000 mL | Freq: Once | INTRAVENOUS | Status: DC

## 2021-02-26 MED ORDER — SODIUM CHLORIDE 0.9% IV SOLUTION: Freq: Once | INTRAVENOUS | Status: DC

## 2021-02-26 MED ORDER — DEXTROSE 5 % AND 0.45 % NACL IV BOLUS
500.0000 mL | Freq: Once | INTRAVENOUS | Status: AC
  Administered 2021-02-26: 13:00:00 500 mL via INTRAVENOUS

## 2021-02-26 MED ORDER — CHLORHEXIDINE GLUCONATE CLOTH 2 % EX PADS
6.0000 | MEDICATED_PAD | Freq: Every day | CUTANEOUS | Status: DC
  Administered 2021-02-26 – 2021-02-27 (×2): 6 via TOPICAL

## 2021-02-26 MED ORDER — VITAMIN K 100 MCG PO TABS: 200.0000 ug | ORAL_TABLET | Freq: Every day | ORAL | Status: DC

## 2021-02-26 MED ORDER — DEXTROSE 5 % AND 0.45 % NACL IV BOLUS
500.0000 mL | Freq: Once | INTRAVENOUS | Status: AC
  Administered 2021-02-26: 10:00:00 500 mL via INTRAVENOUS

## 2021-02-26 MED ORDER — DEXTROSE 10 % IV SOLN: INTRAVENOUS | Status: DC

## 2021-02-26 MED ORDER — SODIUM CHLORIDE 0.9 % IV SOLN: 10.0000 mL/h | Freq: Once | INTRAVENOUS | Status: DC

## 2021-02-26 MED ORDER — DEXTROSE 50 % IV SOLN
INTRAVENOUS | Status: AC
  Administered 2021-02-26: 15:00:00 50 mL via INTRAVENOUS
  Filled 2021-02-26: qty 50

## 2021-02-26 MED ORDER — PANTOPRAZOLE SODIUM 40 MG PO TBEC
40.0000 mg | DELAYED_RELEASE_TABLET | Freq: Every day | ORAL | Status: DC
  Administered 2021-02-26 – 2021-02-27 (×2): 40 mg via ORAL
  Filled 2021-02-26 (×2): qty 1

## 2021-02-26 MED ORDER — SEVELAMER CARBONATE 800 MG PO TABS: 800.0000 mg | ORAL_TABLET | Freq: Three times a day (TID) | ORAL | Status: DC

## 2021-02-26 MED ORDER — SODIUM THIOSULFATE 250 MG/ML IV SOLN
25.0000 g | INTRAVENOUS | Status: DC
  Filled 2021-02-26 (×3): qty 100

## 2021-02-26 MED ORDER — SORBITOL 70 % SOLN
30.0000 mL | Freq: Every day | Status: DC | PRN
  Filled 2021-02-26: qty 30

## 2021-02-26 MED ORDER — AMITRIPTYLINE HCL 25 MG PO TABS
50.0000 mg | ORAL_TABLET | Freq: Every day | ORAL | Status: DC
  Administered 2021-02-26: 21:00:00 50 mg via ORAL
  Filled 2021-02-26: qty 2

## 2021-02-26 MED ORDER — CALCIUM CARBONATE ANTACID 500 MG PO CHEW
1.0000 | CHEWABLE_TABLET | Freq: Three times a day (TID) | ORAL | Status: DC
  Administered 2021-02-26 – 2021-02-27 (×3): 200 mg via ORAL
  Filled 2021-02-26 (×2): qty 1

## 2021-02-26 MED ORDER — METOCLOPRAMIDE HCL 5 MG/ML IJ SOLN
10.0000 mg | Freq: Once | INTRAMUSCULAR | Status: AC
  Administered 2021-02-26: 10:00:00 10 mg via INTRAVENOUS
  Filled 2021-02-26: qty 2

## 2021-02-26 MED ORDER — DEXTROSE 50 % IV SOLN: 1.0000 | INTRAVENOUS | Status: AC

## 2021-02-26 MED ORDER — RENA-VITE PO TABS
1.0000 | ORAL_TABLET | Freq: Every day | ORAL | Status: DC
  Administered 2021-02-26: 21:00:00 1 via ORAL
  Filled 2021-02-26: qty 1

## 2021-02-26 MED ORDER — ONDANSETRON HCL 4 MG/2ML IJ SOLN
4.0000 mg | Freq: Four times a day (QID) | INTRAMUSCULAR | Status: DC | PRN
  Administered 2021-02-27: 10:00:00 4 mg via INTRAVENOUS
  Filled 2021-02-26: qty 2

## 2021-02-26 MED ORDER — POLYETHYLENE GLYCOL 3350 17 G PO PACK: 17.0000 g | PACK | Freq: Every day | ORAL | Status: DC | PRN

## 2021-02-26 NOTE — H&P (Signed)
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  She received oral glucose and a nutrient supplement but this only transiently improved her CBG to 91.  Likewise while in the ED, despite multiple doses of D50, her CBG has rapidly decreased below 70 each time.  She also reports a + hx of recurrent vomiting, primarily related to attempts to eat, for about 20 days. She tells me she was admitted to [facility] for this issue, but a definitive cause could not be found. She was referred to a Gastroenterologist as an outpatient, but this appointment is not until late January. She reports consistent vomiting with most oral intake, though it is not always immediate. This has lead to a significant decrease in her intake since onset.   Assessment/Plan  Refractory hypoglycemia Pt is not a diabetic, is not on any offending meds, and has no known hx of liver disease - it is not clear why she would be experiencing such profound, symptomatic hypogycemic spells - to minimize volume in this HD patient will utilize D10 IVF - monitor CBG closely - if proves refractory will need to consider further w/u to include possible insulinoma - check serum cortisol   Severe anemia Likely multifactorial to include poor nutrition, ESRD, and blood loss related to bleeding from calciphylaxis skin lesions - being transfused 2U PRBC in ED - f/u CBC in AM - check anemia panel to determine if Fe load indicated - evaluate for signif GIB, though pt denies clear melena or BRBPR   Recurrent refractory vomiting Will  investigate extent of workup at [facility] - begin clear liquids for now and follow - reglan prn   ESRD on TTS HD Follow electrolytes - if remains hospitalized will alert Nephrology to attend to HD on Saturday - cont usual outpatient meds - no acute need for HD presently   Calciphylaxis Monitor skin wounds - continue usual outpatient medications   DVT prophylaxis: SCDs Code Status: Full code Family Communication: No family present at time of admission Disposition Plan:  Admit to Inpatient  Consults called: none indicated  Review of Systems: As per HPI otherwise 10 point review of systems negative.   Past Medical History:  Diagnosis Date   CKD (chronic kidney disease) requiring chronic dialysis [facility])     Past Surgical History:  Procedure Laterality Date   DIALYSIS/PERMA CATHETER INSERTION     HERNIA REPAIR      Family History  History reviewed. No pertinent family history.  Social History   reports that she has been smoking. She has a 1.50 pack-year smoking history. She has never used smokeless tobacco. She reports that she does not drink alcohol and does not use drugs.  Allergies Allergies  Allergen Reactions   Icodextrin Other (See Comments)    unknown unknown    Succinylcholine Other (See Comments)    Patient received succinylcholine on 02/12/21. No twitches on ulnar nerve until 26 minutes later, and unable to extubate due to low tidal volumes until 50 minutes after administration. No other neuromuscular blocking drugs or opioids were adminstered. 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 Patient received  succinylcholine on 02/12/21. No twitches on ulnar nerve until 26 minutes later, and unable to extubate due to low tidal volumes until 50 minutes after administration. No other neuromuscular blocking drugs or opioids were adminstered. 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     Prior to Admission medications   Medication Sig Start Date End Date  Taking? Authorizing Provider  amitriptyline (ELAVIL) 50 MG tablet Take 50 mg by mouth at bedtime. 02/13/21  Yes [provider]  b complex-vitamin c-folic acid (NEPHRO-VITE) 0.8 MG TABS tablet Take 1 tablet by mouth daily. 02/13/21  Yes [provider]  Buprenorphine HCl-Naloxone HCl 8-2 MG FILM Place 1 Film under the tongue in the morning and at bedtime. 02/13/21 03/13/21 Yes [provider]  clindamycin (CLEOCIN T) 1 % external solution Apply 1 application topically 2 (two) times daily. 02/25/21  Yes [provider]  naloxone Karma Greaser) 2 MG/2ML injection For suspected opioid overdose, spray 1 mL in each nostril.  Repeat after 3 minutes if no or minimal response. 02/13/21  Yes [provider]  ondansetron (ZOFRAN-ODT) 4 MG disintegrating tablet Take 4 mg by mouth every 6 (six) hours as needed. 02/19/21  Yes [provider]  pantoprazole (PROTONIX) 40 MG tablet Take 40 mg by mouth daily. 02/13/21  Yes [provider]  sevelamer carbonate (RENVELA) 800 MG tablet Take 800 mg by mouth 3 (three) times daily. 06/14/19  Yes [provider]  sodium thiosulfate 250 MG/ML injection Inject 25 g into the vein 3 (three) times a week. 02/13/21  Yes [provider]  vitamin k 100 MCG tablet Take 200 mcg by mouth daily. 02/13/21  Yes [provider]    Physical Exam: Vitals:   02/26/21 1309 02/26/21 1315 02/26/21 1330 02/26/21 1400  BP:   90/70   Pulse:  (!) 124 (!) 116 (!) 116  Resp:  16 18 13   Temp: 97.9 F (36.6 C)     TempSrc: Rectal     SpO2:  99% 100% 100%    Constitutional: NAD, calm, comfortable Eyes: PERRL, lids and conjunctivae normal ENMT: Mucous membranes are moist. Posterior pharynx clear of any exudate or lesions. Neck: normal, supple, no masses, no thyromegaly Respiratory: clear to auscultation bilaterally, no wheezing, no crackles. Normal respiratory effort. No accessory muscle use.  Cardiovascular:  Regular rate and rhythm, no murmurs / rubs / gallops. No extremity edema. HD cath in L upper chest  Abdomen: No tenderness or masses to palpation. No hepatosplenomegaly. Bowel sounds positive. Not distended. Soft.  Musculoskeletal: No clubbing / cyanosis. No joint deformity upper and lower extremities. No contractures. Normal muscle tone.  Skin: very dry - scattered lesions c/w her hx of calciphylaxis  Neurologic: CN 2-12 grossly intact B. Sensation intact. Strength 5/5 in all 4 extremities.  Psychiatric: Normal judgment and insight. Alert and oriented x 3. Normal mood.    Labs on Admission:   CBC: Recent Labs  Lab 02/26/21 1006  WBC 6.5  HGB 6.3*  HCT 22.2*  MCV 95.3  PLT 829   Basic Metabolic Panel: Recent Labs  Lab 02/26/21 1006  NA 135  K 4.2  CL 99  CO2 24  GLUCOSE 136*  BUN 16  CREATININE 3.14*  CALCIUM 8.1*   GFR: CrCl cannot be calculated (Unknown ideal weight.).  Liver Function Tests: Recent Labs  Lab 02/26/21 1006  AST 16  ALT 6  ALKPHOS 77  BILITOT 0.5  PROT 5.7*  ALBUMIN <1.5*   Recent Labs  Lab 02/26/21 1006  LIPASE 19    CBG: Recent Labs  Lab 02/26/21 0941 02/26/21 0958 02/26/21 1126 02/26/21 1241 02/26/21 1306  GLUCAP 48* 87 77 [place]    Cherene Altes, MD [facility] Office  [phone] Pager - Text Page per Amion as per below:  On-Call/Text Page:      [email or web address]  If 7PM-7AM, please contact night-coverage [email or web address] 02/26/2021, 2:19 PM

## 2021-02-26 NOTE — ED Notes (Signed)
Blood consent done

## 2021-02-26 NOTE — ED Triage Notes (Signed)
Pt in from [facility] in [place] from [facility], per report pt was hypoglycemic upon arrival to HD, and received oral glucose and a shake, pts CBG in route was 91, with initial CBG 53, pt was hypotensive initially with BP 73 sys and she received 250 mL NS at HD and 100 mL in route to hospital, #20 in RAC, A&O x4, pt is transitioning from peritoneal dialysis to Hemodialysis, pt did not receive tx today, last full HD was 2 days ago, pt reported that she was admitted to [facility] recently

## 2021-02-26 NOTE — ED Provider Notes (Signed)
[facility] EMERGENCY DEPARTMENT Provider Note   CSN: [id number] Arrival date & time: 02/26/21  8921     History Chief Complaint  Patient presents with   Hypotension    Amy Camacho is a 25 y.o. 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   She received oral glucose and a shake prior to arrival bringing her cbg up to 91.  She was recently discharged from [facility] (12/22) secondary to uncontrolled nausea and vomiting with syncopal event at dialysis.  Sx felt possibly secondary to GERD (but also could not rule out calciphylaxis as the trigger) and was started on Protonix but pt states has continued to vomit, reporting unable to keep any PO intake down x 4 days.  She has taken oral zofran which has not been effective, otc emetrol seems to work better.  She denies fevers, chills.  She does endorse having left lower abdominal pain since last night, tried to have a bm this am but was unable. Denies rectal pain.  She does not make urine.    The history is provided by the patient.      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   Dilated cardiomyopathy ([place]) 03/28/2020    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[place]) 04/19/2019   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   Cancer related pain 03/27/2019   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   Adenopathy 03/16/2019   Volume overload 05/30/2018   Abdominal hernia without obstruction and without gangrene 11/28/2017   Peritonitis associated with peritoneal dialysis ([place]) 06/22/2017   Encounter for pre-transplant evaluation for kidney transplant 03/30/2017   ESRD (end stage renal disease) ([place]) 02/18/2017   Irregular menstrual cycle 10/29/2016   Morbid obesity with BMI of 40.0-44.9, adult ([place]) 10/29/2016   CKD (chronic kidney disease) stage 5, GFR less than 15 ml/min (HCC) 09/03/2015   Proteinuria 05/08/2014   Anemia 04/03/2014   Morbid obesity due to excess calories ([place]) 04/03/2014   IgA nephropathy determined by biopsy of kidney 01/22/2014    Past Surgical History:  Procedure Laterality Date   DIALYSIS/PERMA CATHETER INSERTION     HERNIA REPAIR       OB History   No obstetric history on file.     History reviewed. No pertinent family history.  Social History   Tobacco Use   Smoking status: Every Day    Packs/day: 0.50    Years: 3.00    Pack years: 1.50    Types: Cigarettes   Smokeless tobacco: Never  Vaping Use   Vaping Use: Never used  Substance Use Topics   Alcohol use: Never   Drug use: Never    Home Medications Prior to Admission medications   Medication Sig Start Date End Date Taking? Authorizing Provider  amitriptyline (ELAVIL) 50  MG tablet Take 50 mg by mouth at bedtime. 02/13/21  Yes [provider]  b complex-vitamin c-folic acid (NEPHRO-VITE) 0.8 MG TABS tablet Take 1 tablet by mouth daily. 02/13/21  Yes [provider]  Buprenorphine HCl-Naloxone HCl 8-2 MG FILM Place 1 Film under the tongue in the morning and at bedtime. 02/13/21 03/13/21 Yes [provider]  clindamycin (CLEOCIN T) 1 % external solution Apply 1 application topically 2 (two) times daily. 02/25/21  Yes  [provider]  naloxone Karma Greaser) 2 MG/2ML injection For suspected opioid overdose, spray 1 mL in each nostril.  Repeat after 3 minutes if no or minimal response. 02/13/21  Yes [provider]  ondansetron (ZOFRAN-ODT) 4 MG disintegrating tablet Take 4 mg by mouth every 6 (six) hours as needed. 02/19/21  Yes [provider]  pantoprazole (PROTONIX) 40 MG tablet Take 40 mg by mouth daily. 02/13/21  Yes [provider]  sevelamer carbonate (RENVELA) 800 MG tablet Take 800 mg by mouth 3 (three) times daily. 06/14/19  Yes [provider]  sodium thiosulfate 250 MG/ML injection Inject 25 g into the vein 3 (three) times a week. 02/13/21  Yes [provider]  vitamin k 100 MCG tablet Take 200 mcg by mouth daily. 02/13/21  Yes [provider]    Allergies    Icodextrin and Succinylcholine  Review of Systems   Review of Systems  Constitutional:  Positive for fatigue. Negative for fever.  HENT:  Negative for congestion and sore throat.   Eyes: Negative.   Respiratory:  Negative for chest tightness and shortness of breath.   Cardiovascular:  Negative for chest pain.  Gastrointestinal:  Positive for abdominal pain, constipation, nausea and vomiting. Negative for diarrhea and rectal pain.  Genitourinary: Negative.   Musculoskeletal:  Negative for arthralgias, joint swelling and neck pain.  Neurological:  Positive for weakness. Negative for dizziness, light-headedness, numbness and headaches.  Psychiatric/Behavioral: Negative.     Physical Exam Updated Vital Signs BP 95/79 (BP Location: Left Arm)    Pulse (!) 102    Temp 97.9 F (36.6 C) (Rectal)    Resp 14    SpO2 97%   Physical Exam Vitals and nursing note reviewed. Exam conducted with a chaperone present.  Constitutional:      Appearance: She is well-developed.  HENT:     Head: Normocephalic and atraumatic.  Eyes:     Conjunctiva/sclera: Conjunctivae normal.  Cardiovascular:      Rate and Rhythm: Regular rhythm. Tachycardia present.     Heart sounds: Normal heart sounds.     Comments: Borderline tachycardia. Pulmonary:     Effort: Pulmonary effort is normal.     Breath sounds: Normal breath sounds. No wheezing.  Abdominal:     General: Bowel sounds are normal.     Palpations: Abdomen is soft.     Tenderness: There is no abdominal tenderness. There is no guarding.  Genitourinary:    Rectum: Guaiac result positive.  Musculoskeletal:        General: Normal range of motion.     Cervical back: Normal range of motion.  Skin:    General: Skin is warm and dry.     Findings: Rash present.     Comments: Generalized dry skin, areas of hyperkeratosis.  There are several areas of shallow-based ulcerations, 1 on the right upper buttock, the other on the inferior right buttock, upper thigh region consistent with calciphylaxis.  She also has a pilonidal cyst abscess which has drained,  there is a small open area still present, this is soft, no fluctuance or induration, no active drainage.    Neurological:     Mental Status: She is alert.    ED Results / Procedures / Treatments   Labs (all labs ordered are listed, but only abnormal results are displayed) Labs Reviewed  CBC WITH DIFFERENTIAL/PLATELET - Abnormal; Notable for the following components:      Result Value   RBC 2.33 (*)    Hemoglobin 6.3 (*)    HCT 22.2 (*)    MCHC 28.4 (*)    RDW 18.4 (*)    All other components within normal limits  COMPREHENSIVE METABOLIC PANEL - Abnormal; Notable for the following components:   Glucose, Bld 136 (*)    Creatinine, Ser 3.14 (*)    Calcium 8.1 (*)    Total Protein 5.7 (*)    Albumin <1.5 (*)    GFR, Estimated 20 (*)    All other components within normal limits  HCG, QUANTITATIVE, PREGNANCY - Abnormal; Notable for the following components:   hCG, Beta Chain, Quant, S 40 (*)    All other components within normal limits  CBG MONITORING, ED - Abnormal; Notable for the  following components:   Glucose-Capillary 54 (*)    All other components within normal limits  CBG MONITORING, ED - Abnormal; Notable for the following components:   Glucose-Capillary 59 (*)    All other components within normal limits  CBG MONITORING, ED - Abnormal; Notable for the following components:   Glucose-Capillary 112 (*)    All other components within normal limits  LIPASE, BLOOD  OCCULT BLOOD X 1 CARD TO LAB, STOOL  CBG MONITORING, ED  CBG MONITORING, ED  PREPARE RBC (CROSSMATCH)    EKG EKG Interpretation  Date/Time:  Thursday February 26 2021 09:38:17 EST Ventricular Rate:  117 PR Interval:  134 QRS Duration: 110 QT Interval:  347 QTC Calculation: 485 R Axis:   65 Text Interpretation: Sinus tachycardia Abnormal T, consider ischemia, diffuse leads Baseline wander in lead(s) V1 No old tracing to compare Confirmed by Calvert Cantor [id number]) on 02/26/2021 10:18:57 AM  Radiology DG ABD ACUTE 2+V W 1V CHEST  Result Date: 02/26/2021 CLINICAL DATA:  Nausea, vomiting, abdominal pain, hyperglycemia EXAM: DG ABDOMEN ACUTE WITH 1 VIEW CHEST COMPARISON:  None. FINDINGS: Chest: A left-sided dialysis catheter is in place terminating in the right atrium. The heart is at the upper limits of normal for size. The upper mediastinal contours are normal. There is a small left pleural effusion with adjacent left basilar opacity. There is a trace right effusion. There is no overt pulmonary edema. There is no pneumothorax. Irregularity of the medial right clavicle likely corresponds to the finding on the prior CT chest from 03/16/2019, though those images are not available for direct comparison. Abdomen: There is gaseous distention of multiple loops of bowel without evidence of mechanical obstruction. There is a moderate stool burden primarily in the right hemiabdomen. There is no evidence of free intraperitoneal air. IMPRESSION: 1. Small left and trace right pleural effusions with adjacent  opacities in the left base. 2. Mild gaseous distention of multiple loops of bowel in the abdomen without evidence of mechanical obstruction. Moderate stool burden in the right hemiabdomen. 3. Irregularity of the medial right clavicle likely corresponds to the destructive process described on the prior CT chest from 03/16/2019, though these images are not available for direct comparison. Electronically Signed   By: Court Joy.D.  On: 02/26/2021 10:42    Procedures Procedures   Medications Ordered in ED Medications  0.9 %  sodium chloride infusion (has no administration in time range)  metoCLOPramide (REGLAN) injection 10 mg (10 mg Intravenous Given 02/26/21 0951)  dextrose 5 % and 0.45% NaCl 5-0.45 % bolus 500 mL (500 mLs Intravenous New Bag/Given 02/26/21 1001)  dextrose 50 % solution 50 mL (50 mLs Intravenous Given 02/26/21 0946)  dextrose 5 % and 0.45% NaCl 5-0.45 % bolus 500 mL (0 mLs Intravenous Stopped 02/26/21 1236)  dextrose 50 % solution 50 mL (50 mLs Intravenous Given 02/26/21 1247)    ED Course  I have reviewed the triage vital signs and the nursing notes.  Pertinent labs & imaging results that were available during my care of the patient were reviewed by me and considered in my medical decision making (see chart for details).    MDM Rules/Calculators/A&P                         Labs and imaging reviewed.  Pt continues to have drops in her blood glucose despite 5% dextrose infusion and D5 x 2.  She is also profoundly anemic again at 6.3 today.  With her recent hospitalization at [facility],  she received prbc's as well, her dc hgb from there was 7.2, reached a hight of 9.2 after transfusion.  Blood pressures remain soft here but she has had more than 50% dialysis tx prior to arrival which could be source of hypotension. Discussed lab findings with patient who agrees to prbc transfusion,  2 units ordered. Pt will require admission.  Discussed with Dr. Thereasa Solo who accepts pt for  admission.    Final Clinical Impression(s) / ED Diagnoses Final diagnoses:  Hemodialysis-associated hypotension  Hypoglycemia  Anemia, unspecified type    Rx / DC Orders ED Discharge Orders     None        Landis Martins 02/26/21 1345    Truddie Hidden, MD 02/27/21 [phone]

## 2021-02-27 DIAGNOSIS — E43 Unspecified severe protein-calorie malnutrition: Secondary | ICD-10-CM | POA: Diagnosis present

## 2021-02-27 DIAGNOSIS — E11649 Type 2 diabetes mellitus with hypoglycemia without coma: Secondary | ICD-10-CM | POA: Diagnosis not present

## 2021-02-27 DIAGNOSIS — E162 Hypoglycemia, unspecified: Secondary | ICD-10-CM | POA: Diagnosis present

## 2021-02-27 DIAGNOSIS — E10649 Type 1 diabetes mellitus with hypoglycemia without coma: Secondary | ICD-10-CM | POA: Diagnosis not present

## 2021-02-27 LAB — GLUCOSE, CAPILLARY
Glucose-Capillary: 69 mg/dL — ABNORMAL LOW (ref 70–99)
Glucose-Capillary: 71 mg/dL (ref 70–99)
Glucose-Capillary: 73 mg/dL (ref 70–99)
Glucose-Capillary: 74 mg/dL (ref 70–99)
Glucose-Capillary: 80 mg/dL (ref 70–99)
Glucose-Capillary: 81 mg/dL (ref 70–99)

## 2021-02-27 LAB — HEMOGLOBIN A1C
Hgb A1c MFr Bld: 4.6 % — ABNORMAL LOW (ref 4.8–5.6)
Mean Plasma Glucose: 85.32 mg/dL

## 2021-02-27 LAB — CBC
HCT: 23.8 % — ABNORMAL LOW (ref 36.0–46.0)
Hemoglobin: 7.2 g/dL — ABNORMAL LOW (ref 12.0–15.0)
MCH: 28 pg (ref 26.0–34.0)
MCHC: 30.3 g/dL (ref 30.0–36.0)
MCV: 92.6 fL (ref 80.0–100.0)
Platelets: 222 10*3/uL (ref 150–400)
RBC: 2.57 MIL/uL — ABNORMAL LOW (ref 3.87–5.11)
RDW: 18.2 % — ABNORMAL HIGH (ref 11.5–15.5)
WBC: 8.4 10*3/uL (ref 4.0–10.5)
nRBC: 0 % (ref 0.0–0.2)

## 2021-02-27 LAB — RETICULOCYTES
Immature Retic Fract: 32.3 % — ABNORMAL HIGH (ref 2.3–15.9)
RBC.: 2.6 MIL/uL — ABNORMAL LOW (ref 3.87–5.11)
Retic Count, Absolute: 62.9 10*3/uL (ref 19.0–186.0)
Retic Ct Pct: 2.4 % (ref 0.4–3.1)

## 2021-02-27 LAB — COMPREHENSIVE METABOLIC PANEL
ALT: 6 U/L (ref 0–44)
AST: 15 U/L (ref 15–41)
Albumin: <1.5 g/dL — ABNORMAL LOW (ref 3.5–5.0)
Alkaline Phosphatase: 75 U/L (ref 38–126)
Anion gap: 11 (ref 5–15)
BUN: 22 mg/dL — ABNORMAL HIGH (ref 6–20)
CO2: 25 mmol/L (ref 22–32)
Calcium: 7.9 mg/dL — ABNORMAL LOW (ref 8.9–10.3)
Chloride: 98 mmol/L (ref 98–111)
Creatinine, Ser: 4.14 mg/dL — ABNORMAL HIGH (ref 0.44–1.00)
GFR, Estimated: 15 mL/min — ABNORMAL LOW (ref >60)
Glucose, Bld: 72 mg/dL (ref 70–99)
Potassium: 3.7 mmol/L (ref 3.5–5.1)
Sodium: 134 mmol/L — ABNORMAL LOW (ref 135–145)
Total Bilirubin: 0.4 mg/dL (ref 0.3–1.2)
Total Protein: 5.4 g/dL — ABNORMAL LOW (ref 6.5–8.1)

## 2021-02-27 LAB — HEPATITIS B SURFACE ANTIBODY,QUALITATIVE: Hep B S Ab: REACTIVE — AB

## 2021-02-27 LAB — IRON AND TIBC: Iron: 13 ug/dL — ABNORMAL LOW (ref 28–170)

## 2021-02-27 LAB — FERRITIN: Ferritin: 771 ng/mL — ABNORMAL HIGH (ref 11–307)

## 2021-02-27 LAB — VITAMIN B12: Vitamin B-12: 545 pg/mL (ref 180–914)

## 2021-02-27 LAB — FOLATE: Folate: 7.7 ng/mL (ref >5.9)

## 2021-02-27 LAB — CORTISOL: Cortisol, Plasma: 12.7 ug/dL

## 2021-02-27 MED ORDER — BOOST / RESOURCE BREEZE PO LIQD CUSTOM: 1.0000 | Freq: Three times a day (TID) | ORAL | Status: DC

## 2021-02-27 MED ORDER — SODIUM CHLORIDE 0.9 % IV SOLN
510.0000 mg | Freq: Once | INTRAVENOUS | Status: AC
  Administered 2021-02-27: 14:00:00 510 mg via INTRAVENOUS
  Filled 2021-02-27 (×2): qty 17

## 2021-02-27 MED ORDER — METOPROLOL TARTRATE 25 MG PO TABS: 12.5000 mg | ORAL_TABLET | Freq: Two times a day (BID) | ORAL | 0 refills | Status: DC

## 2021-02-27 MED ORDER — METOPROLOL TARTRATE 25 MG PO TABS
12.5000 mg | ORAL_TABLET | Freq: Two times a day (BID) | ORAL | Status: DC
  Administered 2021-02-27: 17:00:00 12.5 mg via ORAL
  Filled 2021-02-27: qty 1

## 2021-02-27 NOTE — Care Management Obs Status (Signed)
[place] NOTIFICATION   Patient Details  Name: Amy Camacho MRN: [id number] Date of Birth: 07-21-1995   Medicare Observation Status Notification Given:  Yes    Boneta Lucks, RN 02/27/2021, 4:56 PM

## 2021-02-27 NOTE — Progress Notes (Signed)
° °  02/27/21   To Whom it may concern,  Sherial Ebrahim was hospitalized at [facility] from 02/26/2021 through 02/27/21.  Her mother is her primary care giver, and her presence was necessary to assist in her care throughout her stay in the hospital.    Should you have further questions please feel free to contact our office.  Be advised that no medical information will be divulged without a signed HIPA release from the patient.    Sincerely,    Cherene Altes, MD [facility] Office  [phone]

## 2021-02-27 NOTE — Progress Notes (Signed)
Patient discharged home via wheelchair with mother. Discharge teaching complete. Meds, diet, activity, follow up appointments reviewed and all questions answered. Copy of instructions given to patient and prescription sent to pharmacy.

## 2021-02-27 NOTE — Progress Notes (Signed)
Initial Nutrition Assessment  DOCUMENTATION CODES:  Severe malnutrition in context of chronic illness  INTERVENTION:  Add Boost Breeze po TID, each supplement provides 250 kcal and 9 grams of protein.  Add Magic cup TID with meals, each supplement provides 290 kcal and 9 grams of protein.  Continue Rena-Vite daily.  Encourage PO and supplement intake.  NUTRITION DIAGNOSIS:  Severe Malnutrition related to chronic illness (chronic vomiting from unknown etiology) as evidenced by severe fat depletion, severe muscle depletion, percent weight loss.  GOAL:  Patient will meet greater than or equal to 90% of their needs  MONITOR:  PO intake, Supplement acceptance, Labs, Weight trends, Skin, I & O's  REASON FOR ASSESSMENT:  Malnutrition Screening Tool    ASSESSMENT:  25 yo female with a PMH of IgA nephropathy leading to ESRD on TTS dialysis, heroin abuse, calciphylaxis, chronic anemia, and HTN who was receiving her scheduled hemodialysis today when she became weak and lethargic and was found to have a blood sugar of 53.  She received oral glucose and a nutrient supplement but this only transiently improved her CBG to 91.  Likewise while in the ED, despite multiple doses of D50, her CBG has rapidly decreased below 70 each time.     She also reports a + hx of recurrent vomiting, primarily related to attempts to eat, for about 20 days. She tells me she was admitted to [facility] for this issue, but a definitive cause could not be found. She was referred to a Gastroenterologist as an outpatient, but this appointment is not until late January. She reports consistent vomiting with most oral intake, though it is not always immediate. This has lead to a significant decrease in her intake since onset. 12/29 - CLD 12/30 - Renal with 1200 FR  Spoke with pt at bedside. She reports that for the past year, she has been vomiting and having nausea on and off. It is worse with eating. She has been trying to drink  Boost at home, but sometimes, it comes back up.  She has had Nepro shakes before, but she did not like them as much.  Pt reports being on PD for a while, but has just recently transitioned to HD. She has been tolerating it well.  Per Epic, pt has lost ~45 lbs (25%) in the past 14 months, which is significant and severe for the time.  RD to order Boost Breeze and [employer] for patient. Pt not interested in Ensure.   Medications: reviewed; TUMS TID, Rena-Vite, Protonix, Vitamin K, Zofran PRN (given once today)  Labs: reviewed; Na 134 (L), Crt 4.14 (H), CBG 45-112  NUTRITION - FOCUSED PHYSICAL EXAM: Flowsheet Row Most Recent Value  Orbital Region Mild depletion  Upper Arm Region Severe depletion  Thoracic and Lumbar Region Severe depletion  Buccal Region Mild depletion  Temple Region Mild depletion  Clavicle Bone Region Severe depletion  Clavicle and Acromion Bone Region Severe depletion  Scapular Bone Region Severe depletion  Dorsal Hand Moderate depletion  Patellar Region Severe depletion  Anterior Thigh Region Severe depletion  Posterior Calf Region Severe depletion  Edema (RD Assessment) None  Hair Reviewed  Eyes Reviewed  Mouth Reviewed  Skin Reviewed  Nails Reviewed   Diet Order:   Diet Order             Diet renal with fluid restriction Fluid restriction: 1200 mL Fluid; Room service appropriate? Yes; Fluid consistency: Thin  Diet effective now  EDUCATION NEEDS:  Education needs have been addressed  Skin:  Skin Assessment: Skin Integrity Issues: Skin Integrity Issues:: Stage II, Other (Comment) Stage II: Sacrum Other: Several wounds on thigh, tibia, hip  Last BM:  02/26/21  Height:  Ht Readings from Last 1 Encounters:  02/26/21 5\' 7"  (1.702 m)   Weight:  Wt Readings from Last 1 Encounters:  02/27/21 61.2 kg   BMI:  Body mass index is 21.13 kg/m.  Estimated Nutritional Needs:  Kcal:  2000-2200 Protein:  85-100 grams Fluid:  >2  L  Derrel Nip, RD, LDN (she/her/hers) Clinical Inpatient Dietitian RD Pager/After-Hours/Weekend Pager # in [place]

## 2021-02-27 NOTE — Consult Note (Signed)
[place] Nurse Consult Note: Patient receiving care in [place] completed remotely after review of chart, [place] with bedside RN Samantha. Attempted to camera in to see the wounds but the patient states she is going home and does not want to worry about it. She has [place] come 3x/wk to change dressings.  Reason for Consult: Multiple wounds Wound type: Patient has a history of calciphylaxis with wounds to the left upper arm, LLE, sacrum, buttocks and perivaginal area. She has been followed previously by the wound care center at [place] known visit was 11/18/20. At that time she was using Silvadene and Xeroform gauze.  Pressure Injury POA: NA  Monitor the wound area(s) for worsening of condition such as: Signs/symptoms of infection, increase in size, development of or worsening of odor, development of pain, or increased pain at the affected locations.   Notify the medical team if any of these develop.  Thank you for the consult. [place] nurse will not follow at this time.   Please re-consult the [place] team if needed.  Cathlean Marseilles Tamala Julian, MSN, RN, [place], [place], [facility] Wound Treatment Associate Pager [phone]

## 2021-02-27 NOTE — Care Management CC44 (Signed)
Condition Code 44 Documentation Completed  Patient Details  Name: Amy Camacho MRN: [id number] Date of Birth: 08-Dec-1995   Condition Code 44 given:  Yes Patient signature on Condition Code 44 notice:  Yes Documentation of 2 MD's agreement:  Yes Code 44 added to claim:  Yes    Boneta Lucks, RN 02/27/2021, 4:56 PM

## 2021-02-27 NOTE — Discharge Summary (Signed)
DISCHARGE SUMMARY  Amy Camacho  MR#: [id number]  DOB:February 11, 1996  Date of Admission: 02/26/2021 Date of Discharge: 02/27/2021  Attending Physician:Carlas Camacho Hennie Duos, MD  [name], [name]  Consults: none   Disposition: D/C home   Follow-up Appts:  Follow-up Information     [name] Follow up in 5 day(s).   Contact information: [place] [address] [phone]         Your [facility] Follow up.   Why: It is very important you keep your scheduled dialysis appointment 02/28/2021 so your heart rate, blood pressure, and blood sugar can be checked when you have your dialysis treatment.                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  She received oral glucose and a nutrient supplement but this only transiently improved her CBG to 91.  Likewise while in the ED, despite multiple doses of D50, her CBG rapidly decreased below 70 each time.   She also reported a + hx of recurrent vomiting, primarily related to attempts to eat, for about 20 days. She stated she was admitted to [facility] for this issue, but a definitive cause could not be found. She was referred to a Gastroenterologist as an outpatient, but this appointment is not until late January. She reported consistent vomiting with most oral intake, though it is not always immediate. This has lead to a significant decrease in her intake since onset.   Hospital Course:  Refractory  hypoglycemia Pt is not a diabetic, is not on any offending meds, and has no known hx of liver disease - it is not clear why she experienced such profound, symptomatic hypogycemic spells - to minimize volume in this HD patient we utilized D10 IVF and monitored her CBG closely - random serum cortisol was normal - after her CBG stabilized, and after she proved she was able to tolerate consistent oral intake, the D10 was discontinued - her CBG subsequently remained stable, and she was asymptomatic - she desired d/c home, and it was felt this was reasonable - her very poor oral intake at presentation certainly contributed to her hypoglycemia, but her body should have been able to compensate better than it did - this will need ongoing outpatient workup if she has recurrent hypoglycemic spells    Severe anemia - Fe deficiency  Likely multifactorial to include poor nutrition, ESRD, and blood loss related to bleeding from calciphylaxis skin lesions - transfused 1U PRBC - f/u CBC confirmed stability - anemia panel revealed signif Fe deficiency - dosed w/ Feraheme during this admit - no melena or BRBPR during this admit    Recurrent refractory vomiting Has been referred to oupt GI clinic for eval - well controlled at time of d/c w/ pt tolerating a diet w/o difficulty    ESRD on TTS HD Followed electrolytes - there was no indication for extra acute HD sessions during this atmit - cont usual outpatient meds - resume usual outpt HD schedule, w/ her next treatment due 02/28/2022  Calciphylaxis Monitored skin wounds - continued usual outpatient medications       Allergies as of 02/27/2021       Reactions   Icodextrin Other (See Comments)   unknown unknown   Succinylcholine Other (See Comments)   Patient received succinylcholine on 02/12/21. No twitches on ulnar nerve until 26 minutes later, and unable to extubate due to low tidal volumes until 50 minutes after administration. No other neuromuscular  blocking drugs or opioids were adminstered. 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 Patient received succinylcholine on 02/12/21. No twitches on ulnar nerve until 26 minutes later, and unable to extubate due to low tidal volumes until 50 minutes after administration. No other neuromuscular blocking drugs or opioids were adminstered. 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        Medication List     TAKE these medications    amitriptyline 50 MG tablet Commonly known as: ELAVIL Take 50 mg by mouth at bedtime.   b complex-vitamin c-folic acid 0.8 MG Tabs tablet Take 1 tablet by mouth daily.   Buprenorphine HCl-Naloxone HCl 8-2 MG Film Place 1 Film under the tongue in the morning and at bedtime.   clindamycin 1 % external solution Commonly known as: CLEOCIN T Apply 1 application topically 2 (two) times daily.   metoprolol tartrate 25 MG tablet Commonly known as: LOPRESSOR Take 0.5 tablets (12.5 mg total) by mouth 2 (two) times daily.   naloxone 2 MG/2ML injection Commonly known as: NARCAN For suspected opioid overdose, spray 1 mL in each nostril.  Repeat after 3 minutes if no or minimal response.   ondansetron 4 MG disintegrating tablet Commonly known as: ZOFRAN-ODT Take 4 mg by mouth every 6 (six) hours as needed.   pantoprazole 40 MG tablet Commonly known as: PROTONIX Take 40 mg by mouth daily.   sevelamer carbonate 800 MG tablet Commonly known as: RENVELA Take 800 mg by mouth 3 (three) times daily.   sodium thiosulfate 250 MG/ML injection Inject 25 g into the vein 3 (three) times a week.   vitamin k 100 MCG tablet Take 200 mcg by mouth daily.        Day of Discharge BP 121/71    Pulse (!) 113    Temp 97.8 F (36.6 C) (Oral)    Resp 18    Ht 5\' 7"  (1.702 m)    Wt 61.2 kg    SpO2 100%    BMI 21.13 kg/m   Physical Exam: General: No acute respiratory distress Lungs: Clear to auscultation bilaterally without wheezes or  crackles Cardiovascular: Regular rate and rhythm without murmur gallop or rub normal S1 and S2 Abdomen: Nontender, nondistended, soft, bowel sounds positive, no rebound, no ascites, no appreciable mass Extremities: No significant cyanosis, clubbing, or edema bilateral lower extremities  Basic Metabolic Panel: Recent Labs  Lab 02/26/21 1006 02/27/21 0425  NA 135 134*  K 4.2 3.7  CL 99 98  CO2 24 25  GLUCOSE 136* 72  BUN 16 22*  CREATININE 3.14* 4.14*  CALCIUM 8.1* 7.9*    Liver Function Tests: Recent Labs  Lab 02/26/21 1006 02/27/21 0425  AST 16 15  ALT 6 6  ALKPHOS 77 75  BILITOT 0.5 0.4  PROT 5.7* 5.4*  ALBUMIN <1.5* <1.5*   Recent Labs  Lab 02/26/21 1006  LIPASE 19    CBC: Recent Labs  Lab 02/26/21 1006 02/26/21 2013 02/27/21 0425  WBC 6.5  --  8.4  HGB 6.3* 7.1* 7.2*  HCT 22.2* 23.8*  23.8*  MCV 95.3  --  92.6  PLT 229  --  222    CBG: Recent Labs  Lab 02/27/21 0437 02/27/21 0748 02/27/21 1117 02/27/21 1358 02/27/21 1614  GLUCAP 71 81 69* 73 80    Recent Results (from the past 240 hour(s))  Resp Panel by RT-PCR (Flu A&B, Covid) Nasopharyngeal Swab     Status: None   Collection Time: 02/26/21  2:59 PM   Specimen: Nasopharyngeal Swab; Nasopharyngeal(NP) swabs in vial transport medium  Result Value Ref Range Status   SARS Coronavirus 2 by RT PCR NEGATIVE NEGATIVE Final    Comment: (NOTE) SARS-CoV-2 target nucleic acids are NOT DETECTED.  The SARS-CoV-2 RNA is generally detectable in upper respiratory specimens during the acute phase of infection. The lowest concentration of SARS-CoV-2 viral copies this assay can detect is 138 copies/mL. A negative result does not preclude SARS-Cov-2 infection and should not be used as the sole basis for treatment or other patient management decisions. A negative result may occur with  improper specimen collection/handling, submission of specimen other than nasopharyngeal swab, presence of viral mutation(s)  within the areas targeted by this assay, and inadequate number of viral copies(<138 copies/mL). A negative result must be combined with clinical observations, patient history, and epidemiological information. The expected result is Negative.  Fact Sheet for Patients:  EntrepreneurPulse.com.au  Fact Sheet for Healthcare Providers:  IncredibleEmployment.be  This test is no t yet approved or cleared by the [place] FDA and  has been authorized for detection and/or diagnosis of SARS-CoV-2 by FDA under an Emergency Use Authorization (EUA). This EUA will remain  in effect (meaning this test can be used) for the duration of the COVID-19 declaration under Section 564(b)(1) of the Act, 21 U.S.C.section 360bbb-3(b)(1), unless the authorization is terminated  or revoked sooner.       Influenza A by PCR NEGATIVE NEGATIVE Final   Influenza B by PCR NEGATIVE NEGATIVE Final    Comment: (NOTE) The Xpert Xpress SARS-CoV-2/FLU/RSV plus assay is intended as an aid in the diagnosis of influenza from Nasopharyngeal swab specimens and should not be used as a sole basis for treatment. Nasal washings and aspirates are unacceptable for Xpert Xpress SARS-CoV-2/FLU/RSV testing.  Fact Sheet for Patients: EntrepreneurPulse.com.au  Fact Sheet for Healthcare Providers: IncredibleEmployment.be  This test is not yet approved or cleared by the [place] FDA and has been authorized for detection and/or diagnosis of SARS-CoV-2 by FDA under an Emergency Use Authorization (EUA). This EUA will remain in effect (meaning this test can be used) for the duration of the COVID-19 declaration under Section 564(b)(1) of the Act, 21 U.S.C. section 360bbb-3(b)(1), unless the authorization is terminated or revoked.  Performed at [facility], [address], [place], [place] [address]   MRSA Next Gen by PCR, Nasal     Status: None    Collection Time: 02/26/21  4:12 PM   Specimen: Nasal Mucosa; Nasal Swab  Result Value Ref Range Status   MRSA by PCR Next Gen NOT DETECTED NOT DETECTED Final    Comment: (NOTE) The GeneXpert MRSA Assay (FDA approved for NASAL specimens only), is one component of a comprehensive MRSA colonization surveillance program. It is not intended to diagnose MRSA infection nor to guide or monitor treatment for MRSA infections. Test performance is not FDA approved in patients less than 50 years old. Performed at [facility], [address] [place], [place] [address]       Time spent in discharge (includes decision making &  examination of pt): 35 minutes  02/27/2021, 4:29 PM   Cherene Altes, MD [facility] Office  [phone]

## 2021-02-28 DIAGNOSIS — D509 Iron deficiency anemia, unspecified: Secondary | ICD-10-CM | POA: Diagnosis not present

## 2021-02-28 DIAGNOSIS — Z992 Dependence on renal dialysis: Secondary | ICD-10-CM | POA: Diagnosis not present

## 2021-02-28 DIAGNOSIS — D631 Anemia in chronic kidney disease: Secondary | ICD-10-CM | POA: Diagnosis not present

## 2021-02-28 DIAGNOSIS — N186 End stage renal disease: Secondary | ICD-10-CM | POA: Diagnosis not present

## 2021-03-02 LAB — TYPE AND SCREEN
ABO/RH(D): B POS
Antibody Screen: NEGATIVE
Unit division: 0
Unit division: 0
Unit division: 0

## 2021-03-02 LAB — BPAM RBC
Blood Product Expiration Date: 202301192359
Blood Product Expiration Date: 202301202359
Blood Product Expiration Date: 202302022359
ISSUE DATE / TIME: 202212291614
Unit Type and Rh: 1700
Unit Type and Rh: 1700
Unit Type and Rh: 5100

## 2021-03-03 DIAGNOSIS — D631 Anemia in chronic kidney disease: Secondary | ICD-10-CM | POA: Diagnosis not present

## 2021-03-03 DIAGNOSIS — D509 Iron deficiency anemia, unspecified: Secondary | ICD-10-CM | POA: Diagnosis not present

## 2021-03-03 DIAGNOSIS — Z992 Dependence on renal dialysis: Secondary | ICD-10-CM | POA: Diagnosis not present

## 2021-03-03 DIAGNOSIS — N250 Renal osteodystrophy: Secondary | ICD-10-CM | POA: Diagnosis not present

## 2021-03-03 DIAGNOSIS — N2581 Secondary hyperparathyroidism of renal origin: Secondary | ICD-10-CM | POA: Diagnosis not present

## 2021-03-03 DIAGNOSIS — N186 End stage renal disease: Secondary | ICD-10-CM | POA: Diagnosis not present

## 2021-03-04 DIAGNOSIS — R600 Localized edema: Secondary | ICD-10-CM | POA: Diagnosis not present

## 2021-03-04 DIAGNOSIS — S81012A Laceration without foreign body, left knee, initial encounter: Secondary | ICD-10-CM | POA: Diagnosis not present

## 2021-03-04 DIAGNOSIS — R296 Repeated falls: Secondary | ICD-10-CM | POA: Diagnosis not present

## 2021-03-04 DIAGNOSIS — Z79899 Other long term (current) drug therapy: Secondary | ICD-10-CM | POA: Diagnosis not present

## 2021-03-04 DIAGNOSIS — F17210 Nicotine dependence, cigarettes, uncomplicated: Secondary | ICD-10-CM | POA: Diagnosis not present

## 2021-03-04 DIAGNOSIS — I959 Hypotension, unspecified: Secondary | ICD-10-CM | POA: Diagnosis not present

## 2021-03-04 DIAGNOSIS — N185 Chronic kidney disease, stage 5: Secondary | ICD-10-CM | POA: Diagnosis not present

## 2021-03-04 DIAGNOSIS — E162 Hypoglycemia, unspecified: Secondary | ICD-10-CM | POA: Diagnosis not present

## 2021-03-04 DIAGNOSIS — R531 Weakness: Secondary | ICD-10-CM | POA: Diagnosis not present

## 2021-03-05 DIAGNOSIS — N2581 Secondary hyperparathyroidism of renal origin: Secondary | ICD-10-CM | POA: Diagnosis not present

## 2021-03-05 DIAGNOSIS — N186 End stage renal disease: Secondary | ICD-10-CM | POA: Diagnosis not present

## 2021-03-05 DIAGNOSIS — D631 Anemia in chronic kidney disease: Secondary | ICD-10-CM | POA: Diagnosis not present

## 2021-03-05 DIAGNOSIS — N250 Renal osteodystrophy: Secondary | ICD-10-CM | POA: Diagnosis not present

## 2021-03-05 DIAGNOSIS — D509 Iron deficiency anemia, unspecified: Secondary | ICD-10-CM | POA: Diagnosis not present

## 2021-03-05 DIAGNOSIS — Z992 Dependence on renal dialysis: Secondary | ICD-10-CM | POA: Diagnosis not present

## 2021-03-07 DIAGNOSIS — N2581 Secondary hyperparathyroidism of renal origin: Secondary | ICD-10-CM | POA: Diagnosis not present

## 2021-03-07 DIAGNOSIS — D631 Anemia in chronic kidney disease: Secondary | ICD-10-CM | POA: Diagnosis not present

## 2021-03-07 DIAGNOSIS — N186 End stage renal disease: Secondary | ICD-10-CM | POA: Diagnosis not present

## 2021-03-07 DIAGNOSIS — D509 Iron deficiency anemia, unspecified: Secondary | ICD-10-CM | POA: Diagnosis not present

## 2021-03-07 DIAGNOSIS — N250 Renal osteodystrophy: Secondary | ICD-10-CM | POA: Diagnosis not present

## 2021-03-07 DIAGNOSIS — Z992 Dependence on renal dialysis: Secondary | ICD-10-CM | POA: Diagnosis not present

## 2021-03-11 DIAGNOSIS — Z79899 Other long term (current) drug therapy: Secondary | ICD-10-CM | POA: Diagnosis not present

## 2021-03-12 DIAGNOSIS — N2581 Secondary hyperparathyroidism of renal origin: Secondary | ICD-10-CM | POA: Diagnosis not present

## 2021-03-12 DIAGNOSIS — N186 End stage renal disease: Secondary | ICD-10-CM | POA: Diagnosis not present

## 2021-03-12 DIAGNOSIS — N250 Renal osteodystrophy: Secondary | ICD-10-CM | POA: Diagnosis not present

## 2021-03-12 DIAGNOSIS — D509 Iron deficiency anemia, unspecified: Secondary | ICD-10-CM | POA: Diagnosis not present

## 2021-03-12 DIAGNOSIS — D631 Anemia in chronic kidney disease: Secondary | ICD-10-CM | POA: Diagnosis not present

## 2021-03-12 DIAGNOSIS — Z992 Dependence on renal dialysis: Secondary | ICD-10-CM | POA: Diagnosis not present

## 2021-03-14 DIAGNOSIS — N2581 Secondary hyperparathyroidism of renal origin: Secondary | ICD-10-CM | POA: Diagnosis not present

## 2021-03-14 DIAGNOSIS — N250 Renal osteodystrophy: Secondary | ICD-10-CM | POA: Diagnosis not present

## 2021-03-14 DIAGNOSIS — D631 Anemia in chronic kidney disease: Secondary | ICD-10-CM | POA: Diagnosis not present

## 2021-03-14 DIAGNOSIS — D509 Iron deficiency anemia, unspecified: Secondary | ICD-10-CM | POA: Diagnosis not present

## 2021-03-14 DIAGNOSIS — N186 End stage renal disease: Secondary | ICD-10-CM | POA: Diagnosis not present

## 2021-03-14 DIAGNOSIS — Z992 Dependence on renal dialysis: Secondary | ICD-10-CM | POA: Diagnosis not present

## 2021-03-17 DIAGNOSIS — Z992 Dependence on renal dialysis: Secondary | ICD-10-CM | POA: Diagnosis not present

## 2021-03-17 DIAGNOSIS — N186 End stage renal disease: Secondary | ICD-10-CM | POA: Diagnosis not present

## 2021-03-17 DIAGNOSIS — N2581 Secondary hyperparathyroidism of renal origin: Secondary | ICD-10-CM | POA: Diagnosis not present

## 2021-03-17 DIAGNOSIS — D631 Anemia in chronic kidney disease: Secondary | ICD-10-CM | POA: Diagnosis not present

## 2021-03-17 DIAGNOSIS — N250 Renal osteodystrophy: Secondary | ICD-10-CM | POA: Diagnosis not present

## 2021-03-17 DIAGNOSIS — D509 Iron deficiency anemia, unspecified: Secondary | ICD-10-CM | POA: Diagnosis not present

## 2021-03-19 DIAGNOSIS — D509 Iron deficiency anemia, unspecified: Secondary | ICD-10-CM | POA: Diagnosis not present

## 2021-03-19 DIAGNOSIS — N250 Renal osteodystrophy: Secondary | ICD-10-CM | POA: Diagnosis not present

## 2021-03-19 DIAGNOSIS — D631 Anemia in chronic kidney disease: Secondary | ICD-10-CM | POA: Diagnosis not present

## 2021-03-19 DIAGNOSIS — Z992 Dependence on renal dialysis: Secondary | ICD-10-CM | POA: Diagnosis not present

## 2021-03-19 DIAGNOSIS — N2581 Secondary hyperparathyroidism of renal origin: Secondary | ICD-10-CM | POA: Diagnosis not present

## 2021-03-19 DIAGNOSIS — N186 End stage renal disease: Secondary | ICD-10-CM | POA: Diagnosis not present

## 2021-03-21 DIAGNOSIS — D631 Anemia in chronic kidney disease: Secondary | ICD-10-CM | POA: Diagnosis not present

## 2021-03-21 DIAGNOSIS — N2581 Secondary hyperparathyroidism of renal origin: Secondary | ICD-10-CM | POA: Diagnosis not present

## 2021-03-21 DIAGNOSIS — D509 Iron deficiency anemia, unspecified: Secondary | ICD-10-CM | POA: Diagnosis not present

## 2021-03-21 DIAGNOSIS — N186 End stage renal disease: Secondary | ICD-10-CM | POA: Diagnosis not present

## 2021-03-21 DIAGNOSIS — N250 Renal osteodystrophy: Secondary | ICD-10-CM | POA: Diagnosis not present

## 2021-03-21 DIAGNOSIS — Z992 Dependence on renal dialysis: Secondary | ICD-10-CM | POA: Diagnosis not present

## 2021-03-24 DIAGNOSIS — Z992 Dependence on renal dialysis: Secondary | ICD-10-CM | POA: Diagnosis not present

## 2021-03-24 DIAGNOSIS — N250 Renal osteodystrophy: Secondary | ICD-10-CM | POA: Diagnosis not present

## 2021-03-24 DIAGNOSIS — N186 End stage renal disease: Secondary | ICD-10-CM | POA: Diagnosis not present

## 2021-03-24 DIAGNOSIS — N2581 Secondary hyperparathyroidism of renal origin: Secondary | ICD-10-CM | POA: Diagnosis not present

## 2021-03-24 DIAGNOSIS — D631 Anemia in chronic kidney disease: Secondary | ICD-10-CM | POA: Diagnosis not present

## 2021-03-24 DIAGNOSIS — D509 Iron deficiency anemia, unspecified: Secondary | ICD-10-CM | POA: Diagnosis not present

## 2021-03-25 DIAGNOSIS — M79671 Pain in right foot: Secondary | ICD-10-CM | POA: Diagnosis not present

## 2021-03-25 DIAGNOSIS — F17210 Nicotine dependence, cigarettes, uncomplicated: Secondary | ICD-10-CM | POA: Diagnosis not present

## 2021-03-25 DIAGNOSIS — Z6821 Body mass index (BMI) 21.0-21.9, adult: Secondary | ICD-10-CM | POA: Diagnosis not present

## 2021-03-26 DIAGNOSIS — N2581 Secondary hyperparathyroidism of renal origin: Secondary | ICD-10-CM | POA: Diagnosis not present

## 2021-03-26 DIAGNOSIS — N186 End stage renal disease: Secondary | ICD-10-CM | POA: Diagnosis not present

## 2021-03-26 DIAGNOSIS — Z992 Dependence on renal dialysis: Secondary | ICD-10-CM | POA: Diagnosis not present

## 2021-03-26 DIAGNOSIS — N250 Renal osteodystrophy: Secondary | ICD-10-CM | POA: Diagnosis not present

## 2021-03-26 DIAGNOSIS — R112 Nausea with vomiting, unspecified: Secondary | ICD-10-CM | POA: Diagnosis not present

## 2021-03-26 DIAGNOSIS — D509 Iron deficiency anemia, unspecified: Secondary | ICD-10-CM | POA: Diagnosis not present

## 2021-03-26 DIAGNOSIS — D631 Anemia in chronic kidney disease: Secondary | ICD-10-CM | POA: Diagnosis not present

## 2021-03-26 DIAGNOSIS — R1010 Upper abdominal pain, unspecified: Secondary | ICD-10-CM | POA: Diagnosis not present

## 2021-03-28 ENCOUNTER — Other Ambulatory Visit: Payer: Self-pay

## 2021-03-28 ENCOUNTER — Emergency Department (HOSPITAL_COMMUNITY)
Admission: EM | Admit: 2021-03-28 | Discharge: 2021-03-28 | Disposition: A | Discharge status: Home / Self Care | Payer: Medicare Other | Attending: Emergency Medicine | Admitting: Emergency Medicine

## 2021-03-28 DIAGNOSIS — Z79899 Other long term (current) drug therapy: Secondary | ICD-10-CM | POA: Diagnosis not present

## 2021-03-28 DIAGNOSIS — Z992 Dependence on renal dialysis: Secondary | ICD-10-CM | POA: Diagnosis not present

## 2021-03-28 DIAGNOSIS — Z20822 Contact with and (suspected) exposure to covid-19: Secondary | ICD-10-CM | POA: Diagnosis not present

## 2021-03-28 DIAGNOSIS — I120 Hypertensive chronic kidney disease with stage 5 chronic kidney disease or end stage renal disease: Secondary | ICD-10-CM | POA: Diagnosis not present

## 2021-03-28 DIAGNOSIS — N250 Renal osteodystrophy: Secondary | ICD-10-CM | POA: Diagnosis not present

## 2021-03-28 DIAGNOSIS — D509 Iron deficiency anemia, unspecified: Secondary | ICD-10-CM | POA: Diagnosis not present

## 2021-03-28 DIAGNOSIS — N186 End stage renal disease: Secondary | ICD-10-CM | POA: Diagnosis not present

## 2021-03-28 DIAGNOSIS — N2581 Secondary hyperparathyroidism of renal origin: Secondary | ICD-10-CM | POA: Diagnosis not present

## 2021-03-28 DIAGNOSIS — R799 Abnormal finding of blood chemistry, unspecified: Secondary | ICD-10-CM | POA: Diagnosis present

## 2021-03-28 DIAGNOSIS — D631 Anemia in chronic kidney disease: Secondary | ICD-10-CM | POA: Diagnosis not present

## 2021-03-28 LAB — BASIC METABOLIC PANEL
Anion gap: 16 — ABNORMAL HIGH (ref 5–15)
BUN: 31 mg/dL — ABNORMAL HIGH (ref 6–20)
CO2: 23 mmol/L (ref 22–32)
Calcium: 11.9 mg/dL — ABNORMAL HIGH (ref 8.9–10.3)
Chloride: 99 mmol/L (ref 98–111)
Creatinine, Ser: 5.46 mg/dL — ABNORMAL HIGH (ref 0.44–1.00)
GFR, Estimated: 10 mL/min — ABNORMAL LOW (ref >60)
Glucose, Bld: 99 mg/dL (ref 70–99)
Potassium: 4.6 mmol/L (ref 3.5–5.1)
Sodium: 138 mmol/L (ref 135–145)

## 2021-03-28 LAB — CBC WITH DIFFERENTIAL/PLATELET
Abs Immature Granulocytes: 0.04 10*3/uL (ref 0.00–0.07)
Basophils Absolute: 0.1 10*3/uL (ref 0.0–0.1)
Basophils Relative: 1 %
Eosinophils Absolute: 0.2 10*3/uL (ref 0.0–0.5)
Eosinophils Relative: 2 %
HCT: 23.9 % — ABNORMAL LOW (ref 36.0–46.0)
Hemoglobin: 7.1 g/dL — ABNORMAL LOW (ref 12.0–15.0)
Immature Granulocytes: 0 %
Lymphocytes Relative: 30 %
Lymphs Abs: 2.9 10*3/uL (ref 0.7–4.0)
MCH: 27.7 pg (ref 26.0–34.0)
MCHC: 29.7 g/dL — ABNORMAL LOW (ref 30.0–36.0)
MCV: 93.4 fL (ref 80.0–100.0)
Monocytes Absolute: 0.5 10*3/uL (ref 0.1–1.0)
Monocytes Relative: 5 %
Neutro Abs: 6 10*3/uL (ref 1.7–7.7)
Neutrophils Relative %: 62 %
Platelets: 279 10*3/uL (ref 150–400)
RBC: 2.56 MIL/uL — ABNORMAL LOW (ref 3.87–5.11)
RDW: 18 % — ABNORMAL HIGH (ref 11.5–15.5)
WBC: 9.7 10*3/uL (ref 4.0–10.5)
nRBC: 0 % (ref 0.0–0.2)

## 2021-03-28 LAB — RESP PANEL BY RT-PCR (FLU A&B, COVID) ARPGX2
Influenza A by PCR: NEGATIVE
Influenza B by PCR: NEGATIVE
SARS Coronavirus 2 by RT PCR: NEGATIVE

## 2021-03-28 MED ORDER — MIDODRINE HCL 5 MG PO TABS
10.0000 mg | ORAL_TABLET | Freq: Once | ORAL | Status: AC
  Administered 2021-03-28: 10 mg via ORAL
  Filled 2021-03-28: qty 2

## 2021-03-28 NOTE — Discharge Instructions (Addendum)
Hemoglobin today is 7.1 Go to dialysis today. Return to ER as needed.

## 2021-03-28 NOTE — ED Triage Notes (Addendum)
Pt. Stated, Im here for dialysis and a blood transfusion. My Hgb is below 6.5. My last treatment was on Thursday

## 2021-03-28 NOTE — ED Notes (Signed)
Patient states she was seen by GI doctor Thurs had lab work done was called  thurs pm with results of hgb 6.5 , states she waited to come today because it was her dialysis day. Denies pain

## 2021-03-28 NOTE — ED Provider Notes (Signed)
[facility] EMERGENCY DEPARTMENT Provider Note   CSN: [id number] Arrival date & time: 03/28/21  1517     History  Chief Complaint  Patient presents with   Vascular Access Problem   Abnormal Lab    Amy Camacho is a 26 y.o. female.  26 year old female with past medical history of IgA nephropathy due to ESRD on dialysis TTS with access in the left upper chest presents with complaint of anemia.  Patient went to GI on Thursday, was called today and notified her hemoglobin was 6.5 and she would need a transfusion.  Patient states that she has had fatigue lately, believes this may be related to her anemia.  Also history of calciphylaxis, states areas seem to be improving but does have bleeding at times, no recent menstrual cycle or other sources of bleeding.  Last dialysis was on Thursday, completed full session, hemoglobin above 7 at that time.      Home Medications Prior to Admission medications   Medication Sig Start Date End Date Taking? Authorizing Provider  amitriptyline (ELAVIL) 50 MG tablet Take 50 mg by mouth at bedtime. 02/13/21   [provider]  b complex-vitamin c-folic acid (NEPHRO-VITE) 0.8 MG TABS tablet Take 1 tablet by mouth daily. 02/13/21   [provider]  clindamycin (CLEOCIN T) 1 % external solution Apply 1 application topically 2 (two) times daily. 02/25/21   [provider]  metoprolol tartrate (LOPRESSOR) 25 MG tablet Take 0.5 tablets (12.5 mg total) by mouth 2 (two) times daily. 02/27/21   Cherene Altes, MD  naloxone [facility]) 2 MG/2ML injection For suspected opioid overdose, spray 1 mL in each nostril.  Repeat after 3 minutes if no or minimal response. 02/13/21   [provider]  ondansetron (ZOFRAN-ODT) 4 MG disintegrating tablet Take 4 mg by mouth every 6 (six) hours as needed. 02/19/21   [provider]  pantoprazole (PROTONIX) 40 MG tablet Take 40 mg by mouth daily. 02/13/21   [provider]  sevelamer carbonate (RENVELA) 800 MG tablet Take 800 mg by mouth 3 (three) times daily. 06/14/19   [provider]  sodium thiosulfate 250 MG/ML injection Inject 25 g into the vein 3 (three) times a week. 02/13/21   [provider]  vitamin k 100 MCG tablet Take 200 mcg by mouth daily. 02/13/21   [provider]      Allergies    Icodextrin and Succinylcholine    Review of Systems   Review of Systems  Constitutional:  Positive for fatigue.  Respiratory:  Negative for shortness of breath.   Cardiovascular:  Negative for chest pain.  Gastrointestinal:  Negative for abdominal pain, nausea and vomiting.  Musculoskeletal:  Negative for arthralgias and myalgias.  Skin:  Positive for wound.  Allergic/Immunologic: Positive for immunocompromised state.  Neurological:  Negative for weakness.   Physical Exam Updated Vital Signs BP 136/90 (BP Location: Right Arm)    Pulse (!) 108    Temp (!) 97.4 F (36.3 C) (Oral)    Resp 16    SpO2 94%  Physical Exam Vitals and nursing note reviewed.  Constitutional:      General: She is not in acute distress.    Appearance: She is well-developed. She is not diaphoretic.  HENT:     Head: Normocephalic and atraumatic.     Mouth/Throat:     Mouth: Mucous membranes are moist.  Cardiovascular:     Rate and Rhythm: Normal rate and regular rhythm.  Heart sounds: Normal heart sounds.  Pulmonary:     Effort: Pulmonary effort is normal.     Breath sounds: Normal breath sounds.  Abdominal:     Palpations: Abdomen is soft.     Tenderness: There is no abdominal tenderness.  Musculoskeletal:     Right lower leg: No edema.     Left lower leg: No edema.  Skin:    General: Skin is warm and dry.  Neurological:     Mental Status: She is alert and oriented to person, place, and time.  Psychiatric:        Behavior: Behavior normal.    ED Results / Procedures / Treatments   Labs (all labs ordered are listed, but  only abnormal results are displayed) Labs Reviewed  BASIC METABOLIC PANEL - Abnormal; Notable for the following components:      Result Value   BUN 31 (*)    Creatinine, Ser 5.46 (*)    Calcium 11.9 (*)    GFR, Estimated 10 (*)    Anion gap 16 (*)    All other components within normal limits  CBC WITH DIFFERENTIAL/PLATELET - Abnormal; Notable for the following components:   RBC 2.56 (*)    Hemoglobin 7.1 (*)    HCT 23.9 (*)    MCHC 29.7 (*)    RDW 18.0 (*)    All other components within normal limits  RESP PANEL BY RT-PCR (FLU A&B, COVID) ARPGX2  TYPE AND SCREEN    EKG None  Radiology No results found.  Procedures Procedures    Medications Ordered in ED Medications  midodrine (PROAMATINE) tablet 10 mg (has no administration in time range)    ED Course/ Medical Decision Making/ A&P                           Medical Decision Making Amount and/or Complexity of Data Reviewed Labs: ordered.  Risk Prescription drug management.   This patient presents to the ED for concern of anemia, this involves an extensive number of treatment options, and is a complaint that carries with it a high risk of complications and morbidity.  The differential diagnosis includes but not limited to anemia of chronic disease, iron deficiency anemia, anemia secondary to her calciphylaxis   Co morbidities that complicate the patient evaluation  IgA nephropathy, ESRD   Additional history obtained:  Additional history obtained from mother at bedside, as patient takes midodrine prior to her dialysis session External records from outside source obtained and reviewed including labs obtained at dialysis 2 days ago showing hemoglobin of 6.5.  Discharge summary from 02/27/2021 hospital admission with hemoglobin trend.   Lab Tests:  I Ordered, and personally interpreted labs.  The pertinent results include: BC with hemoglobin unchanged at 7.1, previously 7.2 at her 02/27/2021  discharge.  Medicines ordered and prescription drug management:  I ordered medication including midodrine for blood pressure control through dialysis Given dose prior to discharge as she is going to her facility for treatment I have reviewed the patients home medicines and have made adjustments as needed    Problem List / ED Course:  26 year old female with above presentation with concern for worsening anemia.  Per GI visit 2 days ago, hemoglobin 6.5 and needs transfusion.  Hemoglobin today is 7.1, no significant change from her baseline.  Vitals are stable patient would like to be discharged to go to her dialysis center today for treatment.  Her chemistry does not show  anything acutely changed from prior, her potassium is stable.  Patient is given her regular midodrine dose prior to discharge to go to dialysis.   Reevaluation:  After the interventions noted above, I reevaluated the patient and found that they have :stayed the same   Dispostion:  After consideration of the diagnostic results and the patients response to treatment, I feel that the patent would benefit from follow-up at dialysis center today for treatment..          Final Clinical Impression(s) / ED Diagnoses Final diagnoses:  ESRD (end stage renal disease) [facility])    Rx / DC Orders ED Discharge Orders     None         Tacy Learn, PA-C 03/28/21 [place], [place], DO 03/28/21 [phone]

## 2021-03-31 ENCOUNTER — Other Ambulatory Visit (HOSPITAL_COMMUNITY): Payer: Self-pay | Admitting: Vascular Surgery

## 2021-03-31 DIAGNOSIS — D631 Anemia in chronic kidney disease: Secondary | ICD-10-CM | POA: Diagnosis not present

## 2021-03-31 DIAGNOSIS — D509 Iron deficiency anemia, unspecified: Secondary | ICD-10-CM | POA: Diagnosis not present

## 2021-03-31 DIAGNOSIS — N250 Renal osteodystrophy: Secondary | ICD-10-CM | POA: Diagnosis not present

## 2021-03-31 DIAGNOSIS — Z01818 Encounter for other preprocedural examination: Secondary | ICD-10-CM

## 2021-03-31 DIAGNOSIS — Z992 Dependence on renal dialysis: Secondary | ICD-10-CM | POA: Diagnosis not present

## 2021-03-31 DIAGNOSIS — N2581 Secondary hyperparathyroidism of renal origin: Secondary | ICD-10-CM | POA: Diagnosis not present

## 2021-03-31 DIAGNOSIS — N186 End stage renal disease: Secondary | ICD-10-CM | POA: Diagnosis not present

## 2021-03-31 DIAGNOSIS — N19 Unspecified kidney failure: Secondary | ICD-10-CM

## 2021-04-01 ENCOUNTER — Encounter: Payer: Medicare Other | Admitting: Vascular Surgery

## 2021-04-01 ENCOUNTER — Other Ambulatory Visit: Payer: Medicare Other

## 2021-04-02 DIAGNOSIS — N250 Renal osteodystrophy: Secondary | ICD-10-CM | POA: Diagnosis not present

## 2021-04-02 DIAGNOSIS — D631 Anemia in chronic kidney disease: Secondary | ICD-10-CM | POA: Diagnosis not present

## 2021-04-02 DIAGNOSIS — D509 Iron deficiency anemia, unspecified: Secondary | ICD-10-CM | POA: Diagnosis not present

## 2021-04-02 DIAGNOSIS — Z992 Dependence on renal dialysis: Secondary | ICD-10-CM | POA: Diagnosis not present

## 2021-04-02 DIAGNOSIS — N186 End stage renal disease: Secondary | ICD-10-CM | POA: Diagnosis not present

## 2021-04-04 DIAGNOSIS — N186 End stage renal disease: Secondary | ICD-10-CM | POA: Diagnosis not present

## 2021-04-04 DIAGNOSIS — D509 Iron deficiency anemia, unspecified: Secondary | ICD-10-CM | POA: Diagnosis not present

## 2021-04-04 DIAGNOSIS — D631 Anemia in chronic kidney disease: Secondary | ICD-10-CM | POA: Diagnosis not present

## 2021-04-04 DIAGNOSIS — Z992 Dependence on renal dialysis: Secondary | ICD-10-CM | POA: Diagnosis not present

## 2021-04-04 DIAGNOSIS — N250 Renal osteodystrophy: Secondary | ICD-10-CM | POA: Diagnosis not present

## 2021-04-07 DIAGNOSIS — D509 Iron deficiency anemia, unspecified: Secondary | ICD-10-CM | POA: Diagnosis not present

## 2021-04-07 DIAGNOSIS — N250 Renal osteodystrophy: Secondary | ICD-10-CM | POA: Diagnosis not present

## 2021-04-07 DIAGNOSIS — Z992 Dependence on renal dialysis: Secondary | ICD-10-CM | POA: Diagnosis not present

## 2021-04-07 DIAGNOSIS — N186 End stage renal disease: Secondary | ICD-10-CM | POA: Diagnosis not present

## 2021-04-07 DIAGNOSIS — D631 Anemia in chronic kidney disease: Secondary | ICD-10-CM | POA: Diagnosis not present

## 2021-04-08 DIAGNOSIS — I959 Hypotension, unspecified: Secondary | ICD-10-CM | POA: Diagnosis not present

## 2021-04-08 DIAGNOSIS — Z0000 Encounter for general adult medical examination without abnormal findings: Secondary | ICD-10-CM | POA: Diagnosis not present

## 2021-04-08 DIAGNOSIS — Z6821 Body mass index (BMI) 21.0-21.9, adult: Secondary | ICD-10-CM | POA: Diagnosis not present

## 2021-04-08 DIAGNOSIS — R0989 Other specified symptoms and signs involving the circulatory and respiratory systems: Secondary | ICD-10-CM | POA: Diagnosis not present

## 2021-04-08 DIAGNOSIS — E162 Hypoglycemia, unspecified: Secondary | ICD-10-CM | POA: Diagnosis not present

## 2021-04-08 DIAGNOSIS — F17210 Nicotine dependence, cigarettes, uncomplicated: Secondary | ICD-10-CM | POA: Diagnosis not present

## 2021-04-08 DIAGNOSIS — Z20828 Contact with and (suspected) exposure to other viral communicable diseases: Secondary | ICD-10-CM | POA: Diagnosis not present

## 2021-04-09 DIAGNOSIS — N186 End stage renal disease: Secondary | ICD-10-CM | POA: Diagnosis not present

## 2021-04-09 DIAGNOSIS — D509 Iron deficiency anemia, unspecified: Secondary | ICD-10-CM | POA: Diagnosis not present

## 2021-04-09 DIAGNOSIS — D631 Anemia in chronic kidney disease: Secondary | ICD-10-CM | POA: Diagnosis not present

## 2021-04-09 DIAGNOSIS — N250 Renal osteodystrophy: Secondary | ICD-10-CM | POA: Diagnosis not present

## 2021-04-09 DIAGNOSIS — Z992 Dependence on renal dialysis: Secondary | ICD-10-CM | POA: Diagnosis not present

## 2021-04-11 DIAGNOSIS — D631 Anemia in chronic kidney disease: Secondary | ICD-10-CM | POA: Diagnosis not present

## 2021-04-11 DIAGNOSIS — D509 Iron deficiency anemia, unspecified: Secondary | ICD-10-CM | POA: Diagnosis not present

## 2021-04-11 DIAGNOSIS — Z992 Dependence on renal dialysis: Secondary | ICD-10-CM | POA: Diagnosis not present

## 2021-04-11 DIAGNOSIS — N250 Renal osteodystrophy: Secondary | ICD-10-CM | POA: Diagnosis not present

## 2021-04-11 DIAGNOSIS — N186 End stage renal disease: Secondary | ICD-10-CM | POA: Diagnosis not present

## 2021-04-14 DIAGNOSIS — D631 Anemia in chronic kidney disease: Secondary | ICD-10-CM | POA: Diagnosis not present

## 2021-04-14 DIAGNOSIS — N186 End stage renal disease: Secondary | ICD-10-CM | POA: Diagnosis not present

## 2021-04-14 DIAGNOSIS — Z992 Dependence on renal dialysis: Secondary | ICD-10-CM | POA: Diagnosis not present

## 2021-04-14 DIAGNOSIS — N250 Renal osteodystrophy: Secondary | ICD-10-CM | POA: Diagnosis not present

## 2021-04-14 DIAGNOSIS — D509 Iron deficiency anemia, unspecified: Secondary | ICD-10-CM | POA: Diagnosis not present

## 2021-04-15 DIAGNOSIS — I70203 Unspecified atherosclerosis of native arteries of extremities, bilateral legs: Secondary | ICD-10-CM | POA: Diagnosis not present

## 2021-04-15 DIAGNOSIS — I743 Embolism and thrombosis of arteries of the lower extremities: Secondary | ICD-10-CM | POA: Diagnosis not present

## 2021-04-16 ENCOUNTER — Encounter (HOSPITAL_COMMUNITY): Payer: Self-pay | Admitting: Emergency Medicine

## 2021-04-16 ENCOUNTER — Emergency Department (HOSPITAL_COMMUNITY)
Admission: EM | Admit: 2021-04-16 | Discharge: 2021-04-17 | Disposition: A | Discharge status: Home / Self Care | Payer: Medicare Other | Attending: Emergency Medicine | Admitting: Emergency Medicine

## 2021-04-16 ENCOUNTER — Other Ambulatory Visit: Payer: Self-pay

## 2021-04-16 DIAGNOSIS — R7989 Other specified abnormal findings of blood chemistry: Secondary | ICD-10-CM | POA: Diagnosis present

## 2021-04-16 DIAGNOSIS — N186 End stage renal disease: Secondary | ICD-10-CM | POA: Insufficient documentation

## 2021-04-16 DIAGNOSIS — I120 Hypertensive chronic kidney disease with stage 5 chronic kidney disease or end stage renal disease: Secondary | ICD-10-CM | POA: Diagnosis not present

## 2021-04-16 DIAGNOSIS — D649 Anemia, unspecified: Secondary | ICD-10-CM

## 2021-04-16 DIAGNOSIS — D509 Iron deficiency anemia, unspecified: Secondary | ICD-10-CM | POA: Diagnosis not present

## 2021-04-16 DIAGNOSIS — Z79899 Other long term (current) drug therapy: Secondary | ICD-10-CM | POA: Insufficient documentation

## 2021-04-16 DIAGNOSIS — Z992 Dependence on renal dialysis: Secondary | ICD-10-CM | POA: Diagnosis not present

## 2021-04-16 DIAGNOSIS — D631 Anemia in chronic kidney disease: Secondary | ICD-10-CM | POA: Insufficient documentation

## 2021-04-16 DIAGNOSIS — N250 Renal osteodystrophy: Secondary | ICD-10-CM | POA: Diagnosis not present

## 2021-04-16 LAB — CBC WITH DIFFERENTIAL/PLATELET
Abs Immature Granulocytes: 0.02 10*3/uL (ref 0.00–0.07)
Basophils Absolute: 0 10*3/uL (ref 0.0–0.1)
Basophils Relative: 0 %
Eosinophils Absolute: 0.5 10*3/uL (ref 0.0–0.5)
Eosinophils Relative: 6 %
HCT: 19.2 % — ABNORMAL LOW (ref 36.0–46.0)
Hemoglobin: 5.4 g/dL — CL (ref 12.0–15.0)
Immature Granulocytes: 0 %
Lymphocytes Relative: 39 %
Lymphs Abs: 3 10*3/uL (ref 0.7–4.0)
MCH: 27.3 pg (ref 26.0–34.0)
MCHC: 28.1 g/dL — ABNORMAL LOW (ref 30.0–36.0)
MCV: 97 fL (ref 80.0–100.0)
Monocytes Absolute: 0.7 10*3/uL (ref 0.1–1.0)
Monocytes Relative: 9 %
Neutro Abs: 3.5 10*3/uL (ref 1.7–7.7)
Neutrophils Relative %: 46 %
Platelets: 234 10*3/uL (ref 150–400)
RBC: 1.98 MIL/uL — ABNORMAL LOW (ref 3.87–5.11)
RDW: 18.2 % — ABNORMAL HIGH (ref 11.5–15.5)
WBC: 7.7 10*3/uL (ref 4.0–10.5)
nRBC: 0 % (ref 0.0–0.2)

## 2021-04-16 LAB — BASIC METABOLIC PANEL
Anion gap: 16 — ABNORMAL HIGH (ref 5–15)
BUN: 23 mg/dL — ABNORMAL HIGH (ref 6–20)
CO2: 23 mmol/L (ref 22–32)
Calcium: 9 mg/dL (ref 8.9–10.3)
Chloride: 98 mmol/L (ref 98–111)
Creatinine, Ser: 3.85 mg/dL — ABNORMAL HIGH (ref 0.44–1.00)
GFR, Estimated: 16 mL/min — ABNORMAL LOW (ref >60)
Glucose, Bld: 84 mg/dL (ref 70–99)
Potassium: 3.9 mmol/L (ref 3.5–5.1)
Sodium: 137 mmol/L (ref 135–145)

## 2021-04-16 LAB — PREPARE RBC (CROSSMATCH)

## 2021-04-16 MED ORDER — SODIUM CHLORIDE 0.9 % IV SOLN
10.0000 mL/h | Freq: Once | INTRAVENOUS | Status: AC
  Administered 2021-04-16: 10 mL/h via INTRAVENOUS

## 2021-04-16 NOTE — ED Provider Notes (Signed)
[facility] EMERGENCY DEPARTMENT Provider Note   CSN: [id number] Arrival date & time: 04/16/21  1949     History  Chief Complaint  Patient presents with   Abnormal Lab    Amy Camacho is a 26 y.o. female.  She has a history of end-stage renal disease and had dialysis this morning.  She said she is here to get a blood transfusion because her hemoglobin was 6.0.  She is not sure how long ago that result was from.  She denies any anemia symptoms otherwise.  She does some pain in her feet but she says that is from peripheral arterial disease and is following with somebody at [facility] for that.  No chest pain or shortness of breath.  She has a line in her left upper chest for dialysis, no complaints  The history is provided by the patient.  Abnormal Lab Time since result:  Unknown Resulting agency:  External Result type: hematology   Hematology:    Hematocrit:  Low   Hemoglobin:  Low     Home Medications Prior to Admission medications   Medication Sig Start Date End Date Taking? Authorizing Provider  amitriptyline (ELAVIL) 50 MG tablet Take 50 mg by mouth at bedtime. 02/13/21  Yes [provider]  b complex-vitamin c-folic acid (NEPHRO-VITE) 0.8 MG TABS tablet Take 1 tablet by mouth daily. 02/13/21  Yes [provider]  Buprenorphine HCl-Naloxone HCl 8-2 MG FILM Place under the tongue 4 (four) times daily. 04/08/21  Yes [provider]  clindamycin (CLEOCIN) 300 MG capsule Take 300 mg by mouth 3 (three) times daily.   Yes [provider]  fluconazole (DIFLUCAN) 150 MG tablet Take 150 mg by mouth once. 04/11/21  Yes [provider]  midodrine (PROAMATINE) 5 MG tablet Take 2.5 mg by mouth See admin instructions. Take on dialysis Harrell Lark, and Saturday 04/08/21  Yes [provider]  naloxone [facility]) 2 MG/2ML injection For suspected opioid overdose, spray 1 mL in each nostril.  Repeat after 3 minutes if no or minimal response. 02/13/21   Yes [provider]  sodium thiosulfate 250 MG/ML injection Inject 25 g into the vein 3 (three) times a week. 02/13/21  Yes [provider]  vitamin k 100 MCG tablet Take 200 mcg by mouth daily. 02/13/21  Yes [provider]      Allergies    Icodextrin and Succinylcholine    Review of Systems   Review of Systems  Constitutional:  Negative for fever.  Respiratory:  Negative for cough and shortness of breath.   Cardiovascular:  Negative for chest pain.  Gastrointestinal:  Negative for abdominal pain.  Neurological:  Negative for headaches.   Physical Exam Updated Vital Signs BP 121/73    Pulse (!) 109    Temp 98.1 F (36.7 C)    Resp 18    Ht 5\' 7"  (1.702 m)    Wt 61.7 kg    SpO2 99%    BMI 21.30 kg/m  Physical Exam Constitutional:      Appearance: Normal appearance. She is well-developed.  HENT:     Head: Normocephalic and atraumatic.  Eyes:     Conjunctiva/sclera: Conjunctivae normal.  Cardiovascular:     Rate and Rhythm: Normal rate and regular rhythm.  Pulmonary:     Effort: Pulmonary effort is normal.     Breath sounds: Normal breath sounds.     Comments: Nontender line left upper chest Musculoskeletal:     Cervical back: Neck supple.  Skin:    General: Skin is warm and dry.  Neurological:     General: No focal deficit present.     Mental Status: She is alert.     GCS: GCS eye subscore is 4. GCS verbal subscore is 5. GCS motor subscore is 6.    ED Results / Procedures / Treatments   Labs (all labs ordered are listed, but only abnormal results are displayed) Labs Reviewed  CBC WITH DIFFERENTIAL/PLATELET - Abnormal; Notable for the following components:      Result Value   RBC 1.98 (*)    Hemoglobin 5.4 (*)    HCT 19.2 (*)    MCHC 28.1 (*)    RDW 18.2 (*)    All other components within normal limits  BASIC METABOLIC PANEL - Abnormal; Notable for the following components:   BUN 23 (*)    Creatinine, Ser 3.85 (*)    GFR, Estimated  16 (*)    Anion gap 16 (*)    All other components within normal limits  TYPE AND SCREEN  PREPARE RBC (CROSSMATCH)    EKG None  Radiology No results found.  Procedures Procedures    Medications Ordered in ED Medications  0.9 %  sodium chloride infusion (0 mL/hr Intravenous Stopped 04/17/21 0010)    ED Course/ Medical Decision Making/ A&P Clinical Course as of 04/17/21 1011  Thu Apr 16, 2021  2055 Patient's hemoglobin critically low at 5.4.  I reviewed this with the patient.  She is agreeable to transfusion.  She thinks she would probably do okay with just 1 unit but we will recheck to see how she feels after the first unit. [MB]    Clinical Course User Index [MB] Hayden Rasmussen, MD                           Medical Decision Making Amount and/or Complexity of Data Reviewed Labs: ordered.  Risk Prescription drug management.  This patient complains of low hemoglobin; this involves an extensive number of treatment Options and is a complaint that carries with it a high risk of complications and morbidity. The differential includes anemia, bleeding, metabolic derangement, hyperkalemia, dehydration  I ordered, reviewed and interpreted labs, which included CBC with critically low hemoglobin, chemistries fairly unremarkable in the setting of chronic renal failure I ordered medication IV fluids and transfusion of 1 unit of packed red blood cells and reviewed PMP when indicated. Previous records obtained and reviewed in epic, patient gets much for care at [facility]  After the interventions stated above, I reevaluated the patient and found patient to be asymptomatic and otherwise feeling well. Admission and further testing considered, no indications for admission at this time.  Her care is signed out to Dr. Stark Jock to reevaluate after transfusion of packed red blood cells.  She may require second unit.  Likely can be discharged follow-up outpatient with her treating  providers.          Final Clinical Impression(s) / ED Diagnoses Final diagnoses:  Symptomatic anemia  ESRD (end stage renal disease) ([place])    Rx / DC Orders ED Discharge Orders     None         Hayden Rasmussen, MD 04/17/21 1015

## 2021-04-16 NOTE — ED Triage Notes (Signed)
Pt sent here from dialysis for a blood transfusion. Pt states that her hgb is 6.0

## 2021-04-16 NOTE — ED Notes (Signed)
ED Provider at bedside. 

## 2021-04-16 NOTE — Discharge Instructions (Signed)
You were seen in the emergency department for evaluation of low blood count.  You received 1 unit of blood with some improvement in your symptoms.  Please follow-up with your nephrologist and primary care doctor.  You may require further transfusions.

## 2021-04-17 LAB — TYPE AND SCREEN
ABO/RH(D): B POS
Antibody Screen: NEGATIVE
Unit division: 0

## 2021-04-17 LAB — BPAM RBC
Blood Product Expiration Date: 202303032359
ISSUE DATE / TIME: 202302162130
Unit Type and Rh: 1700

## 2021-04-17 NOTE — ED Provider Notes (Signed)
°  Physical Exam  BP (!) 133/92    Pulse 95    Temp 98.3 F (36.8 C) (Oral)    Resp 18    Ht 5\' 7"  (1.702 m)    Wt 61.7 kg    SpO2 98%    BMI 21.30 kg/m   Physical Exam Vitals and nursing note reviewed.  Constitutional:      General: She is not in acute distress.    Appearance: She is well-developed. She is not diaphoretic.  HENT:     Head: Normocephalic and atraumatic.  Cardiovascular:     Rate and Rhythm: Normal rate and regular rhythm.     Heart sounds: No murmur heard.   No friction rub. No gallop.  Pulmonary:     Effort: Pulmonary effort is normal. No respiratory distress.     Breath sounds: Normal breath sounds. No wheezing.  Abdominal:     General: Bowel sounds are normal. There is no distension.     Palpations: Abdomen is soft.     Tenderness: There is no abdominal tenderness.  Musculoskeletal:        General: Normal range of motion.     Cervical back: Normal range of motion and neck supple.  Skin:    General: Skin is warm and dry.  Neurological:     General: No focal deficit present.     Mental Status: She is alert and oriented to person, place, and time.    Procedures  Procedures  ED Course / MDM   Clinical Course as of 04/17/21 0034  Thu Apr 16, 2021  2055 Patient's hemoglobin critically low at 5.4.  I reviewed this with the patient.  She is agreeable to transfusion.  She thinks she would probably do okay with just 1 unit but we will recheck to see how she feels after the first unit. [MB]    Clinical Course User Index [MB] Hayden Rasmussen, MD   Care signed out to me awaiting completion of blood transfusion.  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  She receives recurrent blood transfusions.  Her blood transfusion is complete and she is feeling better.  I feel as though she can safely be discharged with outpatient follow-up and as needed return.  She has no other complaints.       Veryl Speak, MD 04/17/21 [phone]

## 2021-04-17 NOTE — ED Notes (Signed)
Pt asking for food and beverage- will check with EDP

## 2021-04-17 NOTE — ED Notes (Signed)
Dr Stark Jock at bedside talking with pt- pt given sandwich and beverage per MD ok

## 2021-04-18 DIAGNOSIS — D509 Iron deficiency anemia, unspecified: Secondary | ICD-10-CM | POA: Diagnosis not present

## 2021-04-18 DIAGNOSIS — N250 Renal osteodystrophy: Secondary | ICD-10-CM | POA: Diagnosis not present

## 2021-04-18 DIAGNOSIS — Z992 Dependence on renal dialysis: Secondary | ICD-10-CM | POA: Diagnosis not present

## 2021-04-18 DIAGNOSIS — D631 Anemia in chronic kidney disease: Secondary | ICD-10-CM | POA: Diagnosis not present

## 2021-04-18 DIAGNOSIS — N186 End stage renal disease: Secondary | ICD-10-CM | POA: Diagnosis not present

## 2021-04-21 DIAGNOSIS — Z992 Dependence on renal dialysis: Secondary | ICD-10-CM | POA: Diagnosis not present

## 2021-04-21 DIAGNOSIS — D509 Iron deficiency anemia, unspecified: Secondary | ICD-10-CM | POA: Diagnosis not present

## 2021-04-21 DIAGNOSIS — D631 Anemia in chronic kidney disease: Secondary | ICD-10-CM | POA: Diagnosis not present

## 2021-04-21 DIAGNOSIS — N186 End stage renal disease: Secondary | ICD-10-CM | POA: Diagnosis not present

## 2021-04-21 DIAGNOSIS — N250 Renal osteodystrophy: Secondary | ICD-10-CM | POA: Diagnosis not present

## 2021-04-22 ENCOUNTER — Encounter: Payer: Self-pay | Admitting: Vascular Surgery

## 2021-04-22 ENCOUNTER — Ambulatory Visit (INDEPENDENT_AMBULATORY_CARE_PROVIDER_SITE_OTHER): Payer: Medicare Other | Admitting: Vascular Surgery

## 2021-04-22 ENCOUNTER — Other Ambulatory Visit: Payer: Medicare Other

## 2021-04-22 ENCOUNTER — Encounter: Payer: Medicare Other | Admitting: Vascular Surgery

## 2021-04-22 ENCOUNTER — Other Ambulatory Visit: Payer: Self-pay

## 2021-04-22 VITALS — BP 147/92 | HR 104 | Temp 98.4°F | Resp 20 | Ht 67.0 in | Wt 144.2 lb

## 2021-04-22 DIAGNOSIS — Z992 Dependence on renal dialysis: Secondary | ICD-10-CM

## 2021-04-22 DIAGNOSIS — N186 End stage renal disease: Secondary | ICD-10-CM

## 2021-04-22 NOTE — H&P (View-Only) (Signed)
[facility]ist of [place]  Patient name: Amy Camacho MRN: [id number] DOB: [birth year] Sex: female  REASON FOR CONSULT: Discuss access for hemodialysis  HPI: Amy Camacho is a 26 y.o. female, who is here today for discussion of access for hemodialysis.  She has an extremely complex past history.  She is here today with her mother.  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  She had been on peritoneal dialysis for several years beginning at age 68.  She recently was transition to hemodialysis.  She has a left IJ catheter which was placed at [facility].  She has dialysis at [facility] in [place].  She has never had arm access.  She is right-handed.  She does not have a pacemaker.  She also has concern regarding lower extremity arterial sufficiency and been referred to our office for this as well.  She has been diagnosed with calciphylaxis.  She had wounds on her left posterior calf that been present for 1 to 2 months.  She reports that in the past she has had these similar lesions and eventually healed.  She does have calcium deposits from her hand and also on the medial aspect of her left elbow.  She has scaling of her skin on her arms and legs and reportedly was told that this was related to calcium metabolism as well.  She does have what sounds like neuropathic pain in her foot feet.  Not completely compatible with arterial rest pain.  Past Medical History:  Diagnosis Date   CKD (chronic kidney disease) requiring chronic dialysis ([place])     History reviewed. No pertinent family history.  SOCIAL HISTORY: Social History   Socioeconomic History   Marital status: Single    Spouse name: Not on file   Number of children: Not on file   Years of education: Not on file   Highest education level: Not on file  Occupational History   Not on file  Tobacco Use   Smoking status: Every Day    Packs/day: 0.50    Years: 3.00     Pack years: 1.50    Types: Cigarettes   Smokeless tobacco: Never  Vaping Use   Vaping Use: Never used  Substance and Sexual Activity   Alcohol use: Never   Drug use: Never   Sexual activity: Not on file  Other Topics Concern   Not on file  Social History Narrative   Not on file   Social Determinants of Health   Financial Resource Strain: Not on file  Food Insecurity: Not on file  Transportation Needs: Not on file  Physical Activity: Not on file  Stress: Not on file  Social Connections: Not on file  Intimate Partner Violence: Not on file    Allergies  Allergen Reactions   Icodextrin Other (See Comments)    unknown unknown    Succinylcholine Other (See Comments)    Patient received succinylcholine on 02/12/21. No twitches on ulnar nerve until 26 minutes later, and unable to extubate due to low tidal volumes until 50 minutes after administration. No other neuromuscular blocking drugs or opioids were adminstered. 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 Patient received succinylcholine on 02/12/21. No twitches on ulnar nerve until 26 minutes later, and unable to extubate due to low tidal volumes until 50 minutes after administration. No other neuromuscular blocking drugs or opioids were adminstered. 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     Current Outpatient Medications  Medication Sig Dispense  Refill   amitriptyline (ELAVIL) 50 MG tablet Take 50 mg by mouth at bedtime.     b complex-vitamin c-folic acid (NEPHRO-VITE) 0.8 MG TABS tablet Take 1 tablet by mouth daily.     Buprenorphine HCl-Naloxone HCl 8-2 MG FILM Place under the tongue 4 (four) times daily.     clindamycin (CLEOCIN) 300 MG capsule Take 300 mg by mouth 3 (three) times daily.     fluconazole (DIFLUCAN) 150 MG tablet Take 150 mg by mouth once.     midodrine (PROAMATINE) 5 MG tablet Take 2.5 mg by mouth See admin instructions. Take on dialysis [facility], and Saturday      naloxone [facility]) 2 MG/2ML injection For suspected opioid overdose, spray 1 mL in each nostril.  Repeat after 3 minutes if no or minimal response.     sodium thiosulfate 250 MG/ML injection Inject 25 g into the vein 3 (three) times a week.     vitamin k 100 MCG tablet Take 200 mcg by mouth daily.     No current facility-administered medications for this visit.    REVIEW OF SYSTEMS:  [X]  denotes positive finding, [ ]  denotes negative finding Cardiac  Comments:  Chest pain or chest pressure:    Shortness of breath upon exertion:    Short of breath when lying flat:    Irregular heart rhythm:        Vascular    Pain in calf, thigh, or hip brought on by ambulation: x   Pain in feet at night that wakes you up from your sleep:  x   Blood clot in your veins:    Leg swelling:  x       Pulmonary    Oxygen at home:    Productive cough:     Wheezing:         Neurologic    Sudden weakness in arms or legs:     Sudden numbness in arms or legs:     Sudden onset of difficulty speaking or slurred speech:    Temporary loss of vision in one eye:     Problems with dizziness:         Gastrointestinal    Blood in stool:     Vomited blood:         Genitourinary    Burning when urinating:     Blood in urine:        Psychiatric    Major depression:         Hematologic    Bleeding problems:    Problems with blood clotting too easily:        Skin    Rashes or ulcers: x       Constitutional    Fever or chills:      PHYSICAL EXAM: Vitals:   04/22/21 1143  BP: (!) 147/92  Pulse: (!) 104  Resp: 20  Temp: 98.4 F (36.9 C)  TempSrc: Temporal  SpO2: 98%  Weight: 144 lb 4 oz (65.4 kg)  Height: 5\' 7"  (1.702 m)    GENERAL: The patient is a well-nourished female, in no acute distress. The vital signs are documented above. CARDIOVASCULAR: 2+ radial pulses bilaterally.  Her cephalic veins are visible at the wrist bilaterally with moderate size.  She has 2+ femoral pulses bilaterally.  I  do not palpate popliteal or distal pulses bilaterally. PULMONARY: There is good air exchange  MUSCULOSKELETAL: There are no major deformities or cyanosis. NEUROLOGIC: No focal weakness or paresthesias are detected.  SKIN: She does have 2 ulcerations on the posterior aspect of her left calf.  These have a irregular base with some surrounding thickness that could be related to calciphylaxis. PSYCHIATRIC: The patient has a normal affect.  DATA:  Noninvasive studies at [facility] from 04/15/2021 were reviewed with the patient.  This shows a calcified vessels with unreliable ankle-brachial index.  She does have monophasic flow at the tibial level bilaterally and a toe brachial index of 0.2 bilaterally  I imaged her cephalic and basilic veins bilaterally with SonoSite ultrasound.  She does have moderate cephalic vein bilaterally in the better caliber basilic vein on the right arm.  MEDICAL ISSUES: Had long discussion with the patient and her mother regarding access for hemodialysis.  She appears to have marginal cephalic vein for fistula attempt.  I did discuss options of AV Gore-Tex graft as well.  I feel that she will have difficulty maintaining patency of an AV graft related to her small size and related peripheral disease.  I have recommended an attempt at left radiocephalic fistula creation.  I explained that she may not have adequate maturation for use of this and then would resort to either right arm brachiobasilic fistula or left AV Gore-Tex graft.  We will plan access placement at [facility] on 05/05/2021.  If her cephalic vein is inadequate at the time of surgery, we would place a left arm AV graft at that time.  Regarding her lower extremity flow, the patient and her mother do feel that this is improving regarding her left calf ulceration.  I would take an extremely conservative approach regarding this.  I did explain the next step in arterial evaluation would be formal lower extremity  arteriography but would reserve this for progression of her wound.  I feel that she has an extremely high risk for failure with either endovascular treatment or bypass.  Explained that we would like to defer this as long as possible due to her young age.  We will continue to follow her wound is well   Rosetta Posner, MD [facility] Specialists of [facility] [phone] Pager [phone]  Note: Portions of this report may have been transcribed using voice recognition software.  Every effort has been made to ensure accuracy; however, inadvertent computerized transcription errors may still be present.

## 2021-04-22 NOTE — Progress Notes (Signed)
Vascular and Vein Specialist of [place]  Patient name: Amy Camacho MRN: [id number] DOB: 1995-06-05 Sex: female  REASON FOR CONSULT: Discuss access for hemodialysis  HPI: [name] is a 26 y.o. female, who is here today for discussion of access for hemodialysis.  She has an extremely complex past history.  She is here today with her mother.  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  She had been on peritoneal dialysis for several years beginning at age 38.  She recently was transition to hemodialysis.  She has a left IJ catheter which was placed at [facility].  She has dialysis at [facility] in [place].  She has never had arm access.  She is right-handed.  She does not have a pacemaker.  She also has concern regarding lower extremity arterial sufficiency and been referred to our office for this as well.  She has been diagnosed with calciphylaxis.  She had wounds on her left posterior calf that been present for 1 to 2 months.  She reports that in the past she has had these similar lesions and eventually healed.  She does have calcium deposits from her hand and also on the medial aspect of her left elbow.  She has scaling of her skin on her arms and legs and reportedly was told that this was related to calcium metabolism as well.  She does have what sounds like neuropathic pain in her foot feet.  Not completely compatible with arterial rest pain.  Past Medical History:  Diagnosis Date   CKD (chronic kidney disease) requiring chronic dialysis ([place])     History reviewed. No pertinent family history.  SOCIAL HISTORY: Social History   Socioeconomic History   Marital status: Single    Spouse name: Not on file   Number of children: Not on file   Years of education: Not on file   Highest education level: Not on file  Occupational History   Not on file  Tobacco Use   Smoking status: Every Day    Packs/day: 0.50    Years: 3.00     Pack years: 1.50    Types: Cigarettes   Smokeless tobacco: Never  Vaping Use   Vaping Use: Never used  Substance and Sexual Activity   Alcohol use: Never   Drug use: Never   Sexual activity: Not on file  Other Topics Concern   Not on file  Social History Narrative   Not on file   Social Determinants of Health   Financial Resource Strain: Not on file  Food Insecurity: Not on file  Transportation Needs: Not on file  Physical Activity: Not on file  Stress: Not on file  Social Connections: Not on file  Intimate Partner Violence: Not on file    Allergies  Allergen Reactions   Icodextrin Other (See Comments)    unknown unknown    Succinylcholine Other (See Comments)    Patient received succinylcholine on 02/12/21. No twitches on ulnar nerve until 26 minutes later, and unable to extubate due to low tidal volumes until 50 minutes after administration. No other neuromuscular blocking drugs or opioids were adminstered. 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 Patient received succinylcholine on 02/12/21. No twitches on ulnar nerve until 26 minutes later, and unable to extubate due to low tidal volumes until 50 minutes after administration. No other neuromuscular blocking drugs or opioids were adminstered. 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     Current Outpatient Medications  Medication Sig Dispense  Refill   amitriptyline (ELAVIL) 50 MG tablet Take 50 mg by mouth at bedtime.     b complex-vitamin c-folic acid (NEPHRO-VITE) 0.8 MG TABS tablet Take 1 tablet by mouth daily.     Buprenorphine HCl-Naloxone HCl 8-2 MG FILM Place under the tongue 4 (four) times daily.     clindamycin (CLEOCIN) 300 MG capsule Take 300 mg by mouth 3 (three) times daily.     fluconazole (DIFLUCAN) 150 MG tablet Take 150 mg by mouth once.     midodrine (PROAMATINE) 5 MG tablet Take 2.5 mg by mouth See admin instructions. Take on dialysis [facility], and Saturday      naloxone [facility]) 2 MG/2ML injection For suspected opioid overdose, spray 1 mL in each nostril.  Repeat after 3 minutes if no or minimal response.     sodium thiosulfate 250 MG/ML injection Inject 25 g into the vein 3 (three) times a week.     vitamin k 100 MCG tablet Take 200 mcg by mouth daily.     No current facility-administered medications for this visit.    REVIEW OF SYSTEMS:  [X]  denotes positive finding, [ ]  denotes negative finding Cardiac  Comments:  Chest pain or chest pressure:    Shortness of breath upon exertion:    Short of breath when lying flat:    Irregular heart rhythm:        Vascular    Pain in calf, thigh, or hip brought on by ambulation: x   Pain in feet at night that wakes you up from your sleep:  x   Blood clot in your veins:    Leg swelling:  x       Pulmonary    Oxygen at home:    Productive cough:     Wheezing:         Neurologic    Sudden weakness in arms or legs:     Sudden numbness in arms or legs:     Sudden onset of difficulty speaking or slurred speech:    Temporary loss of vision in one eye:     Problems with dizziness:         Gastrointestinal    Blood in stool:     Vomited blood:         Genitourinary    Burning when urinating:     Blood in urine:        Psychiatric    Major depression:         Hematologic    Bleeding problems:    Problems with blood clotting too easily:        Skin    Rashes or ulcers: x       Constitutional    Fever or chills:      PHYSICAL EXAM: Vitals:   04/22/21 1143  BP: (!) 147/92  Pulse: (!) 104  Resp: 20  Temp: 98.4 F (36.9 C)  TempSrc: Temporal  SpO2: 98%  Weight: 144 lb 4 oz (65.4 kg)  Height: 5\' 7"  (1.702 m)    GENERAL: The patient is a well-nourished female, in no acute distress. The vital signs are documented above. CARDIOVASCULAR: 2+ radial pulses bilaterally.  Her cephalic veins are visible at the wrist bilaterally with moderate size.  She has 2+ femoral pulses bilaterally.  I  do not palpate popliteal or distal pulses bilaterally. PULMONARY: There is good air exchange  MUSCULOSKELETAL: There are no major deformities or cyanosis. NEUROLOGIC: No focal weakness or paresthesias are detected.  SKIN: She does have 2 ulcerations on the posterior aspect of her left calf.  These have a irregular base with some surrounding thickness that could be related to calciphylaxis. PSYCHIATRIC: The patient has a normal affect.  DATA:  Noninvasive studies at [facility] from 04/15/2021 were reviewed with the patient.  This shows a calcified vessels with unreliable ankle-brachial index.  She does have monophasic flow at the tibial level bilaterally and a toe brachial index of 0.2 bilaterally  I imaged her cephalic and basilic veins bilaterally with SonoSite ultrasound.  She does have moderate cephalic vein bilaterally in the better caliber basilic vein on the right arm.  MEDICAL ISSUES: Had long discussion with the patient and her mother regarding access for hemodialysis.  She appears to have marginal cephalic vein for fistula attempt.  I did discuss options of AV Gore-Tex graft as well.  I feel that she will have difficulty maintaining patency of an AV graft related to her small size and related peripheral disease.  I have recommended an attempt at left radiocephalic fistula creation.  I explained that she may not have adequate maturation for use of this and then would resort to either right arm brachiobasilic fistula or left AV Gore-Tex graft.  We will plan access placement at [facility] on 05/05/2021.  If her cephalic vein is inadequate at the time of surgery, we would place a left arm AV graft at that time.  Regarding her lower extremity flow, the patient and her mother do feel that this is improving regarding her left calf ulceration.  I would take an extremely conservative approach regarding this.  I did explain the next step in arterial evaluation would be formal lower extremity  arteriography but would reserve this for progression of her wound.  I feel that she has an extremely high risk for failure with either endovascular treatment or bypass.  Explained that we would like to defer this as long as possible due to her young age.  We will continue to follow her wound is well   Rosetta Posner, MD [facility] Vascular and Vein Specialists of [facility] [phone] Pager [phone]  Note: Portions of this report may have been transcribed using voice recognition software.  Every effort has been made to ensure accuracy; however, inadvertent computerized transcription errors may still be present.

## 2021-04-23 DIAGNOSIS — N186 End stage renal disease: Secondary | ICD-10-CM | POA: Diagnosis not present

## 2021-04-23 DIAGNOSIS — D631 Anemia in chronic kidney disease: Secondary | ICD-10-CM | POA: Diagnosis not present

## 2021-04-23 DIAGNOSIS — D509 Iron deficiency anemia, unspecified: Secondary | ICD-10-CM | POA: Diagnosis not present

## 2021-04-23 DIAGNOSIS — Z992 Dependence on renal dialysis: Secondary | ICD-10-CM | POA: Diagnosis not present

## 2021-04-23 DIAGNOSIS — N250 Renal osteodystrophy: Secondary | ICD-10-CM | POA: Diagnosis not present

## 2021-04-24 ENCOUNTER — Other Ambulatory Visit: Payer: Self-pay

## 2021-04-28 DIAGNOSIS — D631 Anemia in chronic kidney disease: Secondary | ICD-10-CM | POA: Diagnosis not present

## 2021-04-28 DIAGNOSIS — D509 Iron deficiency anemia, unspecified: Secondary | ICD-10-CM | POA: Diagnosis not present

## 2021-04-28 DIAGNOSIS — N186 End stage renal disease: Secondary | ICD-10-CM | POA: Diagnosis not present

## 2021-04-28 DIAGNOSIS — N250 Renal osteodystrophy: Secondary | ICD-10-CM | POA: Diagnosis not present

## 2021-04-28 DIAGNOSIS — Z992 Dependence on renal dialysis: Secondary | ICD-10-CM | POA: Diagnosis not present

## 2021-04-30 DIAGNOSIS — Z992 Dependence on renal dialysis: Secondary | ICD-10-CM | POA: Diagnosis not present

## 2021-04-30 DIAGNOSIS — N186 End stage renal disease: Secondary | ICD-10-CM | POA: Diagnosis not present

## 2021-04-30 DIAGNOSIS — D509 Iron deficiency anemia, unspecified: Secondary | ICD-10-CM | POA: Diagnosis not present

## 2021-04-30 DIAGNOSIS — D631 Anemia in chronic kidney disease: Secondary | ICD-10-CM | POA: Diagnosis not present

## 2021-04-30 DIAGNOSIS — N2581 Secondary hyperparathyroidism of renal origin: Secondary | ICD-10-CM | POA: Diagnosis not present

## 2021-05-01 ENCOUNTER — Encounter (HOSPITAL_COMMUNITY): Payer: Self-pay

## 2021-05-01 ENCOUNTER — Encounter (HOSPITAL_COMMUNITY)
Admission: RE | Admit: 2021-05-01 | Discharge: 2021-05-01 | Disposition: A | Discharge status: Home / Self Care | Payer: Medicare Other | Source: Ambulatory Visit | Attending: Vascular Surgery | Admitting: Vascular Surgery

## 2021-05-01 VITALS — Wt 140.0 lb

## 2021-05-01 DIAGNOSIS — Z01818 Encounter for other preprocedural examination: Secondary | ICD-10-CM

## 2021-05-01 HISTORY — DX: Anemia, unspecified: D64.9

## 2021-05-02 ENCOUNTER — Encounter (HOSPITAL_COMMUNITY): Payer: Self-pay

## 2021-05-02 ENCOUNTER — Emergency Department (HOSPITAL_COMMUNITY)
Admission: EM | Admit: 2021-05-02 | Discharge: 2021-05-02 | Disposition: A | Discharge status: Home / Self Care | Payer: Medicare Other | Attending: Emergency Medicine | Admitting: Emergency Medicine

## 2021-05-02 ENCOUNTER — Other Ambulatory Visit: Payer: Self-pay

## 2021-05-02 DIAGNOSIS — Z79899 Other long term (current) drug therapy: Secondary | ICD-10-CM | POA: Insufficient documentation

## 2021-05-02 DIAGNOSIS — D649 Anemia, unspecified: Secondary | ICD-10-CM | POA: Diagnosis not present

## 2021-05-02 DIAGNOSIS — R7989 Other specified abnormal findings of blood chemistry: Secondary | ICD-10-CM | POA: Diagnosis present

## 2021-05-02 LAB — CBC
HCT: 24.4 % — ABNORMAL LOW (ref 36.0–46.0)
Hemoglobin: 7 g/dL — ABNORMAL LOW (ref 12.0–15.0)
MCH: 26.6 pg (ref 26.0–34.0)
MCHC: 28.7 g/dL — ABNORMAL LOW (ref 30.0–36.0)
MCV: 92.8 fL (ref 80.0–100.0)
Platelets: 290 10*3/uL (ref 150–400)
RBC: 2.63 MIL/uL — ABNORMAL LOW (ref 3.87–5.11)
RDW: 18.7 % — ABNORMAL HIGH (ref 11.5–15.5)
WBC: 6.8 10*3/uL (ref 4.0–10.5)
nRBC: 0 % (ref 0.0–0.2)

## 2021-05-02 LAB — BASIC METABOLIC PANEL
Anion gap: 17 — ABNORMAL HIGH (ref 5–15)
BUN: 52 mg/dL — ABNORMAL HIGH (ref 6–20)
CO2: 23 mmol/L (ref 22–32)
Calcium: 8.7 mg/dL — ABNORMAL LOW (ref 8.9–10.3)
Chloride: 97 mmol/L — ABNORMAL LOW (ref 98–111)
Creatinine, Ser: 6.45 mg/dL — ABNORMAL HIGH (ref 0.44–1.00)
GFR, Estimated: 9 mL/min — ABNORMAL LOW (ref >60)
Glucose, Bld: 81 mg/dL (ref 70–99)
Potassium: 6 mmol/L — ABNORMAL HIGH (ref 3.5–5.1)
Sodium: 137 mmol/L (ref 135–145)

## 2021-05-02 LAB — HEPATITIS B SURFACE ANTIBODY,QUALITATIVE: Hep B S Ab: REACTIVE — AB

## 2021-05-02 LAB — PREPARE RBC (CROSSMATCH)

## 2021-05-02 LAB — HCG, SERUM, QUALITATIVE: Preg, Serum: NEGATIVE

## 2021-05-02 LAB — HEPATITIS B SURFACE ANTIGEN: Hepatitis B Surface Ag: NONREACTIVE

## 2021-05-02 MED ORDER — SODIUM CHLORIDE 0.9 % IV SOLN: 100.0000 mL | INTRAVENOUS | Status: DC | PRN

## 2021-05-02 MED ORDER — PENTAFLUOROPROP-TETRAFLUOROETH EX AERO: 1.0000 "application " | INHALATION_SPRAY | CUTANEOUS | Status: DC | PRN

## 2021-05-02 MED ORDER — HEPARIN SODIUM (PORCINE) 1000 UNIT/ML DIALYSIS
1000.0000 [IU] | INTRAMUSCULAR | Status: DC | PRN
  Administered 2021-05-02: 3800 [IU] via INTRAVENOUS_CENTRAL

## 2021-05-02 MED ORDER — ALTEPLASE 2 MG IJ SOLR
2.0000 mg | Freq: Once | INTRAMUSCULAR | Status: DC | PRN
  Filled 2021-05-02: qty 2

## 2021-05-02 MED ORDER — CHLORHEXIDINE GLUCONATE CLOTH 2 % EX PADS: 6.0000 | MEDICATED_PAD | Freq: Every day | CUTANEOUS | Status: DC

## 2021-05-02 MED ORDER — LIDOCAINE-PRILOCAINE 2.5-2.5 % EX CREA: 1.0000 "application " | TOPICAL_CREAM | CUTANEOUS | Status: DC | PRN

## 2021-05-02 MED ORDER — SODIUM CHLORIDE 0.9 % IV SOLN: 10.0000 mL/h | Freq: Once | INTRAVENOUS | Status: DC

## 2021-05-02 MED ORDER — LIDOCAINE HCL (PF) 1 % IJ SOLN: 5.0000 mL | INTRAMUSCULAR | Status: DC | PRN

## 2021-05-02 NOTE — ED Triage Notes (Signed)
Patient to ED with reports of low hgb per dialysis labs. Here for transfusion. Denies symptoms related to low hgb. Dialysis days t/thu/sat ?

## 2021-05-02 NOTE — ED Notes (Signed)
Dialysis nurse called, blood to be administered during dialysis  ?

## 2021-05-02 NOTE — ED Notes (Signed)
Pt is still in dialysis here at shift change ?

## 2021-05-02 NOTE — ED Provider Notes (Signed)
[name]   CSN: [id number] Arrival date & time: 05/02/21  0750     History  Chief Complaint  Patient presents with   abnormal labs    Amy Camacho is a 26 y.o. female.  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  She presents to the hospital today with a complaint of anemia of which she was told by dialysis.  She did not go to dialysis today because of her anemia as she was told to come here for a blood transfusion.  She dialyzes Tuesdays Thursdays and Saturdays, she states that she was feeling a little short of breath today but no weakness, no active bleeding.  She last received transfusions monthly for the last couple of months.  She is scheduled to have her fistula placed in her arm tomorrow.  Home Medications Prior to Admission medications   Medication Sig Start Date End Date Taking? Authorizing Provider  acetaminophen (TYLENOL) 500 MG tablet Take 1,000 mg by mouth every 6 (six) hours as needed for moderate pain.    [provider]  amitriptyline (ELAVIL) 50 MG tablet Take 50 mg by mouth at bedtime as needed for sleep. 02/13/21   [provider]  b complex-vitamin c-folic acid (NEPHRO-VITE) 0.8 MG TABS tablet Take 1 tablet by mouth daily. 02/13/21   [provider]  Buprenorphine HCl-Naloxone HCl 8-2 MG FILM Place 0.5 Film under the tongue 4 (four) times daily as needed (pain). 04/08/21   [provider]  midodrine (PROAMATINE) 5 MG tablet Take 2.5 mg by mouth Every Tuesday,Thursday,and Saturday with dialysis. 04/08/21   [provider]  naloxone [facility]) nasal spray 4 mg/0.1 mL Place 1 spray into the nose once.    [provider]  sodium thiosulfate 250 MG/ML injection Inject 25 g into the vein Every Tuesday,Thursday,and Saturday with dialysis. 02/13/21   [provider]  vitamin k 100 MCG tablet  Take 200 mcg by mouth daily. 02/13/21   [provider]      Allergies    Icodextrin and Succinylcholine    Review of Systems   Review of Systems  All other systems reviewed and are negative.  Physical Exam Updated Vital Signs BP 136/86 (BP Location: Right Arm)    Pulse 96    Temp 98.2 F (36.8 C) (Oral)    Resp 18    Ht 1.702 m ('5\' 7"'$ )    Wt 63.5 kg    LMP  (LMP Unknown)    SpO2 99%    BMI 21.93 kg/m  Physical Exam Vitals and nursing note reviewed.  Constitutional:      General: She is not in acute distress.    Appearance: She is well-developed.  HENT:     Head: Normocephalic and atraumatic.     Mouth/Throat:     Pharynx: No oropharyngeal exudate.  Eyes:     General: No scleral icterus.       Right eye: No discharge.        Left eye: No discharge.     Conjunctiva/sclera: Conjunctivae normal.     Pupils: Pupils are equal, round, and reactive to light.  Neck:     Thyroid: No thyromegaly.     Vascular: No JVD.  Cardiovascular:     Rate and Rhythm: Normal rate and regular rhythm.     Heart sounds: Normal heart sounds. No murmur heard.   No  friction rub. No gallop.     Comments: Dialysis catheter present Pulmonary:     Effort: Pulmonary effort is normal. No respiratory distress.     Breath sounds: Normal breath sounds. No wheezing or rales.  Abdominal:     General: Bowel sounds are normal. There is no distension.     Palpations: Abdomen is soft. There is no mass.     Tenderness: There is no abdominal tenderness.  Musculoskeletal:        General: No tenderness. Normal range of motion.     Cervical back: Normal range of motion and neck supple.     Right lower leg: No edema.     Left lower leg: No edema.  Lymphadenopathy:     Cervical: No cervical adenopathy.  Skin:    General: Skin is warm and dry.     Findings: No erythema or rash.  Neurological:     Mental Status: She is alert.     Coordination: Coordination normal.  Psychiatric:        Behavior:  Behavior normal.    ED Results / Procedures / Treatments   Labs (all labs ordered are listed, but only abnormal results are displayed) Labs Reviewed  CBC - Abnormal; Notable for the following components:      Result Value   RBC 2.63 (*)    Hemoglobin 7.0 (*)    HCT 24.4 (*)    MCHC 28.7 (*)    RDW 18.7 (*)    All other components within normal limits  BASIC METABOLIC PANEL - Abnormal; Notable for the following components:   Potassium 6.0 (*)    Chloride 97 (*)    BUN 52 (*)    Creatinine, Ser 6.45 (*)    Calcium 8.7 (*)    GFR, Estimated 9 (*)    Anion gap 17 (*)    All other components within normal limits  HEPATITIS B SURFACE ANTIBODY,QUALITATIVE - Abnormal; Notable for the following components:   Hep B S Ab Reactive (*)    All other components within normal limits  HCG, SERUM, QUALITATIVE  HEPATITIS B SURFACE ANTIGEN  HEPATITIS B SURFACE ANTIBODY, QUANTITATIVE  PREPARE RBC (CROSSMATCH)  TYPE AND SCREEN    EKG None  Radiology No results found.  Procedures .Critical Care Performed by: Noemi Chapel, MD Authorized by: Noemi Chapel, MD   Critical care provider statement:    Critical care time (minutes):  30   Critical care time was exclusive of:  Separately billable procedures and treating other patients and teaching time   Critical care was necessary to treat or prevent imminent or life-threatening deterioration of the following conditions:  Renal failure (Severe anemia)   Critical care was time spent personally by me on the following activities:  Development of treatment plan with patient or surrogate, discussions with consultants, evaluation of patient's response to treatment, examination of patient, ordering and review of laboratory studies, ordering and review of radiographic studies, ordering and performing treatments and interventions, pulse oximetry, re-evaluation of patient's condition, review of old charts and obtaining history from patient or surrogate   I  assumed direction of critical care for this patient from another provider in my specialty: no     Care discussed with: admitting provider   Comments:          Medications Ordered in ED Medications - No data to display  ED Course/ Medical Decision Making/ A&P  Medical Decision Making Amount and/or Complexity of Data Reviewed Labs: ordered.  Risk Prescription drug management.   Review of the medical record shows that the patient is chronically anemic, however she seems to be worse as she was reportedly 6.3 or 6.4, she lives around 7.1-7.5.  This patient presents to the ED for concern of anemia, recurrent, this involves an extensive number of treatment options, and is a complaint that carries with it a high risk of complications and morbidity.  The differential diagnosis includes gastrointestinal bleeding, autoimmune illness, poor production of blood cells which I suspect is the most likely answer   Co morbidities that complicate the patient evaluation  End-stage renal disease, she did not get dialysis today and supposed to have the procedure tomorrow   Additional history obtained:  Additional history obtained from electronic medical record External records from outside source obtained and reviewed including prior transfusions in the hospital, prior admissions to the hospital, vascular surgery notes   Lab Tests:  I Ordered, and personally interpreted labs.  The pertinent results include: CBC, metabolic panel and pregnancy test, they show ongoing anemia, hemoglobin of 7.0, potassium of 6.0 on the metabolic panel   Cardiac Monitoring:  The patient was maintained on a cardiac monitor.  I personally viewed and interpreted the cardiac monitored which showed an underlying rhythm of: Normal sinus rhythm   Medicines ordered and prescription drug management:  I ordered medication including 1 unit of packed red blood cells for anemia Reevaluation of the  patient after these medicines showed that the patient improved I have reviewed the patients home medicines and have made adjustments as needed   Test Considered:  Dialysis, discussed with Dr. Hollie Salk of the [place] [facility], she agrees with dialysis and will coordinate this.  Patient will remain in emergency department patient and will be discharged once dialysis is completed   Critical Interventions:  Order dialysis Treatment of anemia with packed red blood cells This patient needed emergent dialysis given hyperkalemia, needed packed red cells given severe anemia, both given in the emergency   Consultations Obtained:  I requested consultation with the nephrology Dr. Hollie Salk,  and discussed lab and imaging findings as well as pertinent plan - they recommend: Dialysis Discussed the care with Dr. Unk Lightning of the vascular surgery service and he is agreeable that the patient should get a unit of red blood cells.  At the time of change of shift care signed out to Dr. Doren Custard to follow-up when the patient returns from dialysis and disposition.        Final Clinical Impression(s) / ED Diagnoses Final diagnoses:  Low hemoglobin     Noemi Chapel, MD 05/03/21 [phone]

## 2021-05-02 NOTE — ED Provider Notes (Signed)
Care of patient assumed from Dr. Sabra Heck at 2:45 PM.  This patient arrives for acute on chronic anemia.  Plan will be for discharge following 1 unit PRBC transfusion and hemodialysis. ?Physical Exam  ?BP (!) 135/99   Pulse 95   Temp 98.6 ?F (37 ?C) (Oral)   Resp 16   Ht '5\' 7"'$  (1.702 m)   Wt 63.5 kg   LMP  (LMP Unknown)   SpO2 100%   BMI 21.93 kg/m?  ? ?Physical Exam ?Vitals and nursing note reviewed.  ?Constitutional:   ?   General: She is not in acute distress. ?   Appearance: Normal appearance. She is well-developed. She is not ill-appearing, toxic-appearing or diaphoretic.  ?HENT:  ?   Head: Normocephalic and atraumatic.  ?   Right Ear: External ear normal.  ?   Left Ear: External ear normal.  ?   Nose: Nose normal.  ?Eyes:  ?   Extraocular Movements: Extraocular movements intact.  ?   Conjunctiva/sclera: Conjunctivae normal.  ?Cardiovascular:  ?   Rate and Rhythm: Normal rate and regular rhythm.  ?   Heart sounds: No murmur heard. ?Pulmonary:  ?   Effort: Pulmonary effort is normal. No respiratory distress.  ?Abdominal:  ?   General: Abdomen is flat.  ?   Palpations: Abdomen is soft.  ?Musculoskeletal:     ?   General: No swelling. Normal range of motion.  ?   Cervical back: Normal range of motion and neck supple.  ?Skin: ?   General: Skin is warm and dry.  ?   Capillary Refill: Capillary refill takes less than 2 seconds.  ?   Coloration: Skin is not jaundiced or pale.  ?Neurological:  ?   General: No focal deficit present.  ?   Mental Status: She is alert and oriented to person, place, and time.  ?Psychiatric:     ?   Mood and Affect: Mood normal.     ?   Behavior: Behavior normal.     ?   Thought Content: Thought content normal.     ?   Judgment: Judgment normal.  ? ? ?Procedures  ?Procedures ? ?ED Course / MDM  ?  ?Medical Decision Making ?Amount and/or Complexity of Data Reviewed ?Labs: ordered. ? ?Risk ?Prescription drug management. ? ? ?Patient underwent hemodialysis and return to the emergency  department in good condition.  She denies any current physical complaints.  She is stable for discharge at this time. ? ? ? ? ?  ?Godfrey Pick, MD ?05/03/21 1418 ? ?

## 2021-05-03 LAB — TYPE AND SCREEN
ABO/RH(D): B POS
Antibody Screen: NEGATIVE
Unit division: 0

## 2021-05-03 LAB — BPAM RBC
Blood Product Expiration Date: 202303232359
ISSUE DATE / TIME: 202303041647
Unit Type and Rh: 5100

## 2021-05-04 ENCOUNTER — Other Ambulatory Visit (HOSPITAL_COMMUNITY): Payer: Medicare Other | Attending: Vascular Surgery

## 2021-05-04 DIAGNOSIS — N186 End stage renal disease: Secondary | ICD-10-CM | POA: Diagnosis not present

## 2021-05-04 DIAGNOSIS — D631 Anemia in chronic kidney disease: Secondary | ICD-10-CM | POA: Diagnosis not present

## 2021-05-04 DIAGNOSIS — N2581 Secondary hyperparathyroidism of renal origin: Secondary | ICD-10-CM | POA: Diagnosis not present

## 2021-05-04 DIAGNOSIS — Z992 Dependence on renal dialysis: Secondary | ICD-10-CM | POA: Diagnosis not present

## 2021-05-04 DIAGNOSIS — D509 Iron deficiency anemia, unspecified: Secondary | ICD-10-CM | POA: Diagnosis not present

## 2021-05-04 LAB — HEPATITIS B SURFACE ANTIBODY, QUANTITATIVE: Hep B S AB Quant (Post): 45.5 m[IU]/mL (ref >9.9)

## 2021-05-05 ENCOUNTER — Encounter (HOSPITAL_COMMUNITY)
Admission: RE | Disposition: A | Discharge status: Home / Self Care | Payer: Self-pay | Source: Home / Self Care | Attending: Vascular Surgery

## 2021-05-05 ENCOUNTER — Ambulatory Visit (HOSPITAL_BASED_OUTPATIENT_CLINIC_OR_DEPARTMENT_OTHER): Payer: Medicare Other | Admitting: Anesthesiology

## 2021-05-05 ENCOUNTER — Other Ambulatory Visit: Payer: Self-pay

## 2021-05-05 ENCOUNTER — Ambulatory Visit (HOSPITAL_COMMUNITY): Payer: Medicare Other | Admitting: Anesthesiology

## 2021-05-05 ENCOUNTER — Ambulatory Visit (HOSPITAL_COMMUNITY)
Admission: RE | Admit: 2021-05-05 | Discharge: 2021-05-05 | Disposition: A | Discharge status: Home / Self Care | Payer: Medicare Other | Attending: Vascular Surgery | Admitting: Vascular Surgery

## 2021-05-05 ENCOUNTER — Encounter (HOSPITAL_COMMUNITY): Payer: Self-pay | Admitting: Vascular Surgery

## 2021-05-05 DIAGNOSIS — D631 Anemia in chronic kidney disease: Secondary | ICD-10-CM

## 2021-05-05 DIAGNOSIS — F17210 Nicotine dependence, cigarettes, uncomplicated: Secondary | ICD-10-CM | POA: Insufficient documentation

## 2021-05-05 DIAGNOSIS — N186 End stage renal disease: Secondary | ICD-10-CM

## 2021-05-05 DIAGNOSIS — E1122 Type 2 diabetes mellitus with diabetic chronic kidney disease: Secondary | ICD-10-CM

## 2021-05-05 DIAGNOSIS — Z992 Dependence on renal dialysis: Secondary | ICD-10-CM | POA: Diagnosis not present

## 2021-05-05 DIAGNOSIS — M359 Systemic involvement of connective tissue, unspecified: Secondary | ICD-10-CM | POA: Insufficient documentation

## 2021-05-05 DIAGNOSIS — N185 Chronic kidney disease, stage 5: Secondary | ICD-10-CM | POA: Diagnosis not present

## 2021-05-05 HISTORY — PX: AV FISTULA PLACEMENT: SHX1204

## 2021-05-05 LAB — POCT I-STAT, CHEM 8
BUN: 38 mg/dL — ABNORMAL HIGH (ref 6–20)
Calcium, Ion: 1.09 mmol/L — ABNORMAL LOW (ref 1.15–1.40)
Chloride: 98 mmol/L (ref 98–111)
Creatinine, Ser: 4.8 mg/dL — ABNORMAL HIGH (ref 0.44–1.00)
Glucose, Bld: 70 mg/dL (ref 70–99)
HCT: 30 % — ABNORMAL LOW (ref 36.0–46.0)
Hemoglobin: 10.2 g/dL — ABNORMAL LOW (ref 12.0–15.0)
Potassium: 5.8 mmol/L — ABNORMAL HIGH (ref 3.5–5.1)
Sodium: 134 mmol/L — ABNORMAL LOW (ref 135–145)
TCO2: 30 mmol/L (ref 22–32)

## 2021-05-05 SURGERY — ARTERIOVENOUS (AV) FISTULA CREATION
Anesthesia: General | Site: Arm Lower | Laterality: Left

## 2021-05-05 MED ORDER — PROPOFOL 10 MG/ML IV BOLUS
INTRAVENOUS | Status: AC
  Filled 2021-05-05: qty 20

## 2021-05-05 MED ORDER — 0.9 % SODIUM CHLORIDE (POUR BTL) OPTIME
TOPICAL | Status: DC | PRN
  Administered 2021-05-05: 1000 mL

## 2021-05-05 MED ORDER — DEXMEDETOMIDINE (PRECEDEX) IN NS 20 MCG/5ML (4 MCG/ML) IV SYRINGE
PREFILLED_SYRINGE | INTRAVENOUS | Status: DC | PRN
  Administered 2021-05-05 (×2): 8 ug via INTRAVENOUS

## 2021-05-05 MED ORDER — STERILE WATER FOR IRRIGATION IR SOLN
Status: DC | PRN
  Administered 2021-05-05: 500 mL

## 2021-05-05 MED ORDER — PROPOFOL 10 MG/ML IV BOLUS
INTRAVENOUS | Status: AC
Start: 2021-05-05 — End: ?
  Filled 2021-05-05: qty 20

## 2021-05-05 MED ORDER — LIDOCAINE-EPINEPHRINE 0.5 %-1:200000 IJ SOLN
INTRAMUSCULAR | Status: AC
  Filled 2021-05-05: qty 1

## 2021-05-05 MED ORDER — SODIUM CHLORIDE 0.9 % IV SOLN: INTRAVENOUS | Status: DC

## 2021-05-05 MED ORDER — PROPOFOL 10 MG/ML IV BOLUS
INTRAVENOUS | Status: DC | PRN
Start: 2021-05-05 — End: 2021-05-05
  Administered 2021-05-05: 50 mg via INTRAVENOUS

## 2021-05-05 MED ORDER — CHLORHEXIDINE GLUCONATE 0.12 % MT SOLN
15.0000 mL | Freq: Once | OROMUCOSAL | Status: AC
  Administered 2021-05-05: 15 mL via OROMUCOSAL

## 2021-05-05 MED ORDER — FENTANYL CITRATE (PF) 100 MCG/2ML IJ SOLN
INTRAMUSCULAR | Status: AC
  Filled 2021-05-05: qty 2

## 2021-05-05 MED ORDER — PROPOFOL 500 MG/50ML IV EMUL
INTRAVENOUS | Status: DC | PRN
  Administered 2021-05-05: 100 ug/kg/min via INTRAVENOUS

## 2021-05-05 MED ORDER — HEPARIN SODIUM (PORCINE) 1000 UNIT/ML IJ SOLN
INTRAMUSCULAR | Status: AC
  Filled 2021-05-05: qty 6

## 2021-05-05 MED ORDER — CEFAZOLIN SODIUM-DEXTROSE 2-4 GM/100ML-% IV SOLN
INTRAVENOUS | Status: AC
  Filled 2021-05-05: qty 100

## 2021-05-05 MED ORDER — CEFAZOLIN SODIUM-DEXTROSE 2-4 GM/100ML-% IV SOLN
2.0000 g | INTRAVENOUS | Status: AC
  Administered 2021-05-05: 2 g via INTRAVENOUS

## 2021-05-05 MED ORDER — ORAL CARE MOUTH RINSE: 15.0000 mL | Freq: Once | OROMUCOSAL | Status: AC

## 2021-05-05 MED ORDER — LACTATED RINGERS IV SOLN: INTRAVENOUS | Status: DC

## 2021-05-05 MED ORDER — LIDOCAINE-EPINEPHRINE 0.5 %-1:200000 IJ SOLN
INTRAMUSCULAR | Status: DC | PRN
  Administered 2021-05-05: 18 mL

## 2021-05-05 MED ORDER — HEPARIN 6000 UNIT IRRIGATION SOLUTION
Status: DC | PRN
  Administered 2021-05-05: 1

## 2021-05-05 MED ORDER — CHLORHEXIDINE GLUCONATE 4 % EX LIQD: 60.0000 mL | Freq: Once | CUTANEOUS | Status: DC

## 2021-05-05 MED ORDER — FENTANYL CITRATE (PF) 100 MCG/2ML IJ SOLN
INTRAMUSCULAR | Status: DC | PRN
  Administered 2021-05-05: 50 ug via INTRAVENOUS
  Administered 2021-05-05 (×2): 25 ug via INTRAVENOUS

## 2021-05-05 SURGICAL SUPPLY — 43 items
ADH SKN CLS APL DERMABOND .7 (GAUZE/BANDAGES/DRESSINGS) ×1
ARMBAND PINK RESTRICT EXTREMIT (MISCELLANEOUS) ×2 IMPLANT
BAG HAMPER (MISCELLANEOUS) ×2 IMPLANT
CANNULA VESSEL 3MM 2 BLNT TIP (CANNULA) ×2 IMPLANT
CLIP LIGATING EXTRA MED SLVR (CLIP) ×2 IMPLANT
CLIP LIGATING EXTRA SM BLUE (MISCELLANEOUS) ×2 IMPLANT
COVER LIGHT HANDLE STERIS (MISCELLANEOUS) ×4 IMPLANT
COVER MAYO STAND XLG (MISCELLANEOUS) ×2 IMPLANT
COVER PROBE U/S 5X48 (MISCELLANEOUS) IMPLANT
DECANTER SPIKE VIAL GLASS SM (MISCELLANEOUS) ×2 IMPLANT
DERMABOND ADVANCED (GAUZE/BANDAGES/DRESSINGS) ×1
DERMABOND ADVANCED .7 DNX12 (GAUZE/BANDAGES/DRESSINGS) ×1 IMPLANT
ELECT REM PT RETURN 9FT ADLT (ELECTROSURGICAL) ×2
ELECTRODE REM PT RTRN 9FT ADLT (ELECTROSURGICAL) ×1 IMPLANT
GAUZE 4X4 16PLY ~~LOC~~+RFID DBL (SPONGE) ×1 IMPLANT
GAUZE SPONGE 4X4 12PLY STRL (GAUZE/BANDAGES/DRESSINGS) ×2 IMPLANT
GLOVE SURG MICRO LTX SZ7.5 (GLOVE) ×2 IMPLANT
GLOVE SURG UNDER POLY LF SZ7 (GLOVE) ×6 IMPLANT
GOWN STRL REUS W/TWL LRG LVL3 (GOWN DISPOSABLE) ×6 IMPLANT
GRAFT GORETEX STRT 4-7X45 (Vascular Products) ×1 IMPLANT
IV NS 500ML (IV SOLUTION) ×2
IV NS 500ML BAXH (IV SOLUTION) ×1 IMPLANT
KIT BLADEGUARD II DBL (SET/KITS/TRAYS/PACK) ×2 IMPLANT
KIT TURNOVER KIT A (KITS) ×2 IMPLANT
MANIFOLD NEPTUNE II (INSTRUMENTS) ×2 IMPLANT
MARKER SKIN DUAL TIP RULER LAB (MISCELLANEOUS) ×4 IMPLANT
NDL HYPO 18GX1.5 BLUNT FILL (NEEDLE) ×1 IMPLANT
NEEDLE HYPO 18GX1.5 BLUNT FILL (NEEDLE) ×2 IMPLANT
NS IRRIG 1000ML POUR BTL (IV SOLUTION) ×2 IMPLANT
PACK CV ACCESS (CUSTOM PROCEDURE TRAY) ×2 IMPLANT
PAD ARMBOARD 7.5X6 YLW CONV (MISCELLANEOUS) ×2 IMPLANT
SET BASIN LINEN APH (SET/KITS/TRAYS/PACK) ×2 IMPLANT
SOL PREP POV-IOD 4OZ 10% (MISCELLANEOUS) ×2 IMPLANT
SOL PREP PROV IODINE SCRUB 4OZ (MISCELLANEOUS) ×2 IMPLANT
SPONGE T-LAP 18X18 ~~LOC~~+RFID (SPONGE) ×2 IMPLANT
SUT PROLENE 6 0 CC (SUTURE) ×3 IMPLANT
SUT SILK 2 0 SH (SUTURE) IMPLANT
SUT VIC AB 3-0 SH 27 (SUTURE) ×4
SUT VIC AB 3-0 SH 27X BRD (SUTURE) ×1 IMPLANT
SYR 10ML LL (SYRINGE) ×2 IMPLANT
SYR 50ML LL SCALE MARK (SYRINGE) ×2 IMPLANT
UNDERPAD 30X36 HEAVY ABSORB (UNDERPADS AND DIAPERS) ×2 IMPLANT
WATER STERILE IRR 500ML POUR (IV SOLUTION) ×1 IMPLANT

## 2021-05-05 NOTE — Discharge Instructions (Signed)
? ?[facility] ? ?Discharge Instructions ? ?AV Fistula or Graft Surgery for Dialysis Access ? ?Please refer to the following instructions for your post-procedure care. Your surgeon or physician assistant will discuss any changes with you. ? ?Activity ? ?You may drive the day following your surgery, if you are comfortable and no longer taking prescription pain medication. Resume full activity as the soreness in your incision resolves. ? ?Bathing/Showering ? ?You may shower after you go home. Keep your incision dry for 48 hours. Do not soak in a bathtub, hot tub, or swim until the incision heals completely. You may not shower if you have a hemodialysis catheter. ? ?Incision Care ? ?Clean your incision with mild soap and water after 48 hours. Pat the area dry with a clean towel. You do not need a bandage unless otherwise instructed. Do not apply any ointments or creams to your incision. You may have skin glue on your incision. Do not peel it off. It will come off on its own in about one week. Your arm may swell a bit after surgery. To reduce swelling use pillows to elevate your arm so it is above your heart. Your doctor will tell you if you need to lightly wrap your arm with an ACE bandage. ? ?Diet ? ?Resume your normal diet. There are not special food restrictions following this procedure. In order to heal from your surgery, it is CRITICAL to get adequate nutrition. Your body requires vitamins, minerals, and protein. Vegetables are the best source of vitamins and minerals. Vegetables also provide the perfect balance of protein. Processed food has little nutritional value, so try to avoid this. ? ?Medications ? ?Resume taking all of your medications. If your incision is causing pain, you may take over-the counter pain relievers such as acetaminophen (Tylenol). If you were prescribed a stronger pain medication, please be aware these medications can cause nausea and constipation. Prevent  nausea by taking the medication with a snack or meal. Avoid constipation by drinking plenty of fluids and eating foods with high amount of fiber, such as fruits, vegetables, and grains.  ?Do not take Tylenol if you are taking prescription pain medications. ? ?Follow up ?Your surgeon may want to see you in the office following your access surgery. If so, this will be arranged at the time of your surgery. ? ?Please call us immediately for any of the following conditions: ? ?Increased pain, redness, drainage (pus) from your incision site ?Fever of 101 degrees or higher ?Severe or worsening pain at your incision site ?Hand pain or numbness. ? ?Reduce your risk of vascular disease: ? ?Stop smoking. If you would like help, call [facility] at 1-800-QUIT-NOW 6012298133) or [place] at (516) 305-4844 ? ?Manage your cholesterol ?Maintain a desired weight ?Control your diabetes ?Keep your blood pressure down ? ?Dialysis ? ?It will take several weeks to several months for your new dialysis access to be ready for use. Your surgeon will determine when it is okay to use it. Your nephrologist will continue to direct your dialysis. You can continue to use your Permcath until your new access is ready for use. ? ? ?05/05/2021 ?Amy Camacho ?[id number] ?[birth year] ? ?Surgeon(s): ?Davieon Stockham, Arvilla Meres, MD ? ?Procedure(s): ?LEFT ARM  ARTERIOVENOUS GRAFT PLACEMENT ? ? May stick graft immediately  ? May stick graft on designated area only:   ? Do not stick graft for 4 weeks  ? ? ?If you have any questions, please call the office at [phone]. ? ?

## 2021-05-05 NOTE — Anesthesia Postprocedure Evaluation (Signed)
Anesthesia Post Note ? ?Patient: Amy Camacho ? ?Procedure(s) Performed: LEFT ARM  ARTERIOVENOUS GRAFT PLACEMENT (Left: Arm Lower) ? ?Patient location during evaluation: Phase II ?Anesthesia Type: General ?Level of consciousness: awake ?Pain management: pain level controlled ?Vital Signs Assessment: post-procedure vital signs reviewed and stable ?Respiratory status: spontaneous breathing and respiratory function stable ?Cardiovascular status: blood pressure returned to baseline and stable ?Postop Assessment: no headache and no apparent nausea or vomiting ?Anesthetic complications: no ?Comments: Late entry ? ? ?No notable events documented. ? ? ?Last Vitals:  ?Vitals:  ? 05/05/21 1018 05/05/21 1020  ?BP:  (!) 145/95  ?Pulse: 98   ?Resp: 16   ?Temp: 36.6 ?C   ?SpO2: 96%   ?  ?Last Pain:  ?Vitals:  ? 05/05/21 1018  ?TempSrc: Oral  ?PainSc:   ? ? ?  ?  ?  ?  ?  ?  ? ?Louann Sjogren ? ? ? ? ?

## 2021-05-05 NOTE — Anesthesia Preprocedure Evaluation (Signed)
Anesthesia Evaluation  ?Patient identified by MRN, date of birth, ID band ?Patient awake ? ? ? ?Reviewed: ?Allergy & Precautions, H&P , NPO status , Patient's Chart, lab work & pertinent test results, reviewed documented beta blocker date and time  ? ?Airway ?Mallampati: II ? ?TM Distance: >3 FB ?Neck ROM: full ? ? ? Dental ?no notable dental hx. ? ?  ?Pulmonary ?neg pulmonary ROS, Current Smoker,  ?  ?Pulmonary exam normal ?breath sounds clear to auscultation ? ? ? ? ? ? Cardiovascular ?Exercise Tolerance: Good ?negative cardio ROS ? ? ?Rhythm:regular Rate:Normal ? ? ?  ?Neuro/Psych ?negative neurological ROS ? negative psych ROS  ? GI/Hepatic ?negative GI ROS, Neg liver ROS,   ?Endo/Other  ?diabetes, Type 2 ? Renal/GU ?CRF and ESRFRenal disease  ?negative genitourinary ?  ?Musculoskeletal ? ? Abdominal ?  ?Peds ? Hematology ? ?(+) Blood dyscrasia, anemia ,   ?Anesthesia Other Findings ? ? Reproductive/Obstetrics ?negative OB ROS ? ?  ? ? ? ? ? ? ? ? ? ? ? ? ? ?  ?  ? ? ? ? ? ? ? ? ?Anesthesia Physical ?Anesthesia Plan ? ?ASA: 3 ? ?Anesthesia Plan: General  ? ?Post-op Pain Management:   ? ?Induction:  ? ?PONV Risk Score and Plan: Propofol infusion ? ?Airway Management Planned:  ? ?Additional Equipment:  ? ?Intra-op Plan:  ? ?Post-operative Plan:  ? ?Informed Consent: I have reviewed the patients History and Physical, chart, labs and discussed the procedure including the risks, benefits and alternatives for the proposed anesthesia with the patient or authorized representative who has indicated his/her understanding and acceptance.  ? ? ? ?Dental Advisory Given ? ?Plan Discussed with: CRNA ? ?Anesthesia Plan Comments:   ? ? ? ? ? ? ?Anesthesia Quick Evaluation ? ?

## 2021-05-05 NOTE — Interval H&P Note (Signed)
History and Physical Interval Note: ? ?05/05/2021 ?7:18 AM ? ?Amy Camacho  has presented today for surgery, with the diagnosis of CKD IV.  The various methods of treatment have been discussed with the patient and family. After consideration of risks, benefits and other options for treatment, the patient has consented to  Procedure(s): ?LEFT ARM ARTERIOVENOUS (AV) FISTULA VERSUS ARTERIOVENOUS GRAFT CREATION (Left) as a surgical intervention.  The patient's history has been reviewed, patient examined, no change in status, stable for surgery.  I have reviewed the patient's chart and labs.  Questions were answered to the patient's satisfaction.   ? ? ?[name] ? ? ?

## 2021-05-05 NOTE — Transfer of Care (Signed)
Immediate Anesthesia Transfer of Care Note ? ?Patient: Amy Camacho ? ?Procedure(s) Performed: LEFT ARM  ARTERIOVENOUS GRAFT PLACEMENT (Left: Arm Lower) ? ?Patient Location: PACU ? ?Anesthesia Type:General ? ?Level of Consciousness: awake, alert , oriented and patient cooperative ? ?Airway & Oxygen Therapy: Patient Spontanous Breathing ? ?Post-op Assessment: Report given to RN, Post -op Vital signs reviewed and stable and Patient moving all extremities X 4 ? ?Post vital signs: Reviewed and stable ? ?Last Vitals:  ?Vitals Value Taken Time  ?BP 139/91 05/05/21 0922  ?Temp    ?Pulse    ?Resp 18 05/05/21 0924  ?SpO2    ?Vitals shown include unvalidated device data. ? ?Last Pain:  ?Vitals:  ? 05/05/21 0705  ?TempSrc: Oral  ?PainSc: 8   ?   ? ?Patients Stated Pain Goal: 8 (05/05/21 0705) ? ?Complications: No notable events documented. ?

## 2021-05-05 NOTE — Op Note (Signed)
? ? ?  OPERATIVE REPORT ? ?DATE OF SURGERY: 05/05/2021 ? ?PATIENT: Amy Camacho, 26 y.o. female ?MRN: [id number]  ?DOB: 06/27/1995 ? ?PRE-OPERATIVE DIAGNOSIS: End-stage renal disease ? ?POST-OPERATIVE DIAGNOSIS:  Same ? ?PROCEDURE: Left upper arm AV Gore-Tex graft placement, 4-7 taper ? ?SURGEON:  Curt Jews, M.D. ? ?PHYSICIAN ASSISTANT: Vevelyn Royals, RN ? ?The assistant was needed for exposure and to expedite the case ? ?ANESTHESIA: Local with sedation ? ?EBL: per anesthesia record ? ?Total I/O ?In: 400 [I.V.:300; IV Piggyback:100] ?Out: 10 [Blood:10] ? ?BLOOD ADMINISTERED: none ? ?DRAINS: none ? ?SPECIMEN: none ? ?COUNTS CORRECT:  YES ? ?PATIENT DISPOSITION:  PACU - hemodynamically stable ? ?PROCEDURE DETAILS: ?Patient was taken operating placed supine position with area of the left arm prepped draped you sterile fashion.  Patient had a relatively small cephalic vein at the wrist.  I elected to explore this to see if this was any possibility for fistula creation.  Using local anesthesia and made incision between the level of the radial artery and the cephalic vein.  Cephalic vein was extremely small with multiple branches and was not usable for fistula creation.  The vein was sclerotic by ultrasound at the antecubital space.  Incision was made at the axilla over the pulse and the axillary vein was identified and was of good caliber.  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  A separate incision was made transversely at the antecubital space and the brachial artery was exposed.  The artery was small but had no evidence of atherosclerotic change.  A tunnel was created between the level of the brachial artery and the axillary vein and a 4 x 7 tapered Gore-Tex graft was brought through the tunnel.  The brachial artery was occluded proximally and distally and was opened with an 11 blade and sent  illustrating the Pott scissors.  A small arteriotomy was made to reduce risk for steal.  The 4 mm portion of the graft was spatulated and was sewn end-to-side to the artery with a running 6-0 Prolene suture.  A 2 dilator passed proximally and distally prior to completion of the closure.  The anastomosis was completed and the graft was flushed with heparinized saline and reoccluded.  Next the axillary vein was occluded proximally distally was opened with an 11 blade and sent longstanding Pott scissors.  The 7 mm portion of the graft was cut to the appropriate length and was spatulated and sewn end-to-side to the artery with a running 6-0 Prolene suture.  Clamps were removed and excellent thrill was noted through the graft.  The patient did have Doppler flow at the radial artery at the wrist.  This augmented with compression of the graft.  The wounds irrigated with saline.  Hemostasis was obtained with electrocautery.  The wound was closed with 3-0 Vicryl in the subcutaneous and subcuticular tissue.  Sterile dressing was applied and the patient was transferred to the recovery room in stable condition ? ? ?Rosetta Posner, M.D., FACS ?05/05/2021 ?9:31 AM ? ?Note: Portions of this report may have been transcribed using voice recognition software.  Every effort has been made to ensure accuracy; however, inadvertent computerized transcription errors may still be present. ? ? ?

## 2021-05-07 DIAGNOSIS — D509 Iron deficiency anemia, unspecified: Secondary | ICD-10-CM | POA: Diagnosis not present

## 2021-05-07 DIAGNOSIS — D631 Anemia in chronic kidney disease: Secondary | ICD-10-CM | POA: Diagnosis not present

## 2021-05-07 DIAGNOSIS — N186 End stage renal disease: Secondary | ICD-10-CM | POA: Diagnosis not present

## 2021-05-07 DIAGNOSIS — N2581 Secondary hyperparathyroidism of renal origin: Secondary | ICD-10-CM | POA: Diagnosis not present

## 2021-05-07 DIAGNOSIS — Z992 Dependence on renal dialysis: Secondary | ICD-10-CM | POA: Diagnosis not present

## 2021-05-08 ENCOUNTER — Telehealth: Payer: Self-pay | Admitting: Vascular Surgery

## 2021-05-08 ENCOUNTER — Other Ambulatory Visit: Payer: Self-pay

## 2021-05-08 ENCOUNTER — Encounter (HOSPITAL_COMMUNITY): Payer: Self-pay | Admitting: Vascular Surgery

## 2021-05-08 ENCOUNTER — Emergency Department (HOSPITAL_COMMUNITY)
Admission: EM | Admit: 2021-05-08 | Discharge: 2021-05-08 | Disposition: A | Discharge status: Home / Self Care | Payer: Medicare Other | Attending: Emergency Medicine | Admitting: Emergency Medicine

## 2021-05-08 ENCOUNTER — Encounter (HOSPITAL_COMMUNITY): Payer: Medicare Other

## 2021-05-08 DIAGNOSIS — I7789 Other specified disorders of arteries and arterioles: Secondary | ICD-10-CM | POA: Diagnosis not present

## 2021-05-08 DIAGNOSIS — M7989 Other specified soft tissue disorders: Secondary | ICD-10-CM | POA: Insufficient documentation

## 2021-05-08 DIAGNOSIS — D631 Anemia in chronic kidney disease: Secondary | ICD-10-CM | POA: Insufficient documentation

## 2021-05-08 DIAGNOSIS — R202 Paresthesia of skin: Secondary | ICD-10-CM | POA: Diagnosis not present

## 2021-05-08 DIAGNOSIS — N186 End stage renal disease: Secondary | ICD-10-CM | POA: Diagnosis not present

## 2021-05-08 DIAGNOSIS — D649 Anemia, unspecified: Secondary | ICD-10-CM

## 2021-05-08 DIAGNOSIS — Z992 Dependence on renal dialysis: Secondary | ICD-10-CM | POA: Diagnosis not present

## 2021-05-08 DIAGNOSIS — T829XXA Unspecified complication of cardiac and vascular prosthetic device, implant and graft, initial encounter: Secondary | ICD-10-CM

## 2021-05-08 DIAGNOSIS — T82848A Pain from vascular prosthetic devices, implants and grafts, initial encounter: Secondary | ICD-10-CM | POA: Diagnosis not present

## 2021-05-08 DIAGNOSIS — I120 Hypertensive chronic kidney disease with stage 5 chronic kidney disease or end stage renal disease: Secondary | ICD-10-CM | POA: Diagnosis not present

## 2021-05-08 DIAGNOSIS — N185 Chronic kidney disease, stage 5: Secondary | ICD-10-CM | POA: Insufficient documentation

## 2021-05-08 DIAGNOSIS — T82898A Other specified complication of vascular prosthetic devices, implants and grafts, initial encounter: Secondary | ICD-10-CM

## 2021-05-08 LAB — CBC WITH DIFFERENTIAL/PLATELET
Abs Immature Granulocytes: 0.02 10*3/uL (ref 0.00–0.07)
Basophils Absolute: 0 10*3/uL (ref 0.0–0.1)
Basophils Relative: 0 %
Eosinophils Absolute: 0.3 10*3/uL (ref 0.0–0.5)
Eosinophils Relative: 5 %
HCT: 26.1 % — ABNORMAL LOW (ref 36.0–46.0)
Hemoglobin: 7.8 g/dL — ABNORMAL LOW (ref 12.0–15.0)
Immature Granulocytes: 0 %
Lymphocytes Relative: 28 %
Lymphs Abs: 1.9 10*3/uL (ref 0.7–4.0)
MCH: 26.2 pg (ref 26.0–34.0)
MCHC: 29.9 g/dL — ABNORMAL LOW (ref 30.0–36.0)
MCV: 87.6 fL (ref 80.0–100.0)
Monocytes Absolute: 0.8 10*3/uL (ref 0.1–1.0)
Monocytes Relative: 11 %
Neutro Abs: 3.9 10*3/uL (ref 1.7–7.7)
Neutrophils Relative %: 56 %
Platelets: 214 10*3/uL (ref 150–400)
RBC: 2.98 MIL/uL — ABNORMAL LOW (ref 3.87–5.11)
RDW: 18.1 % — ABNORMAL HIGH (ref 11.5–15.5)
WBC: 7 10*3/uL (ref 4.0–10.5)
nRBC: 0 % (ref 0.0–0.2)

## 2021-05-08 LAB — BASIC METABOLIC PANEL
Anion gap: 18 — ABNORMAL HIGH (ref 5–15)
BUN: 39 mg/dL — ABNORMAL HIGH (ref 6–20)
CO2: 24 mmol/L (ref 22–32)
Calcium: 8.4 mg/dL — ABNORMAL LOW (ref 8.9–10.3)
Chloride: 96 mmol/L — ABNORMAL LOW (ref 98–111)
Creatinine, Ser: 5.8 mg/dL — ABNORMAL HIGH (ref 0.44–1.00)
GFR, Estimated: 10 mL/min — ABNORMAL LOW (ref >60)
Glucose, Bld: 88 mg/dL (ref 70–99)
Potassium: 4.7 mmol/L (ref 3.5–5.1)
Sodium: 138 mmol/L (ref 135–145)

## 2021-05-08 MED ORDER — HYDROMORPHONE HCL 1 MG/ML IJ SOLN
0.5000 mg | Freq: Once | INTRAMUSCULAR | Status: AC
  Administered 2021-05-08: 0.5 mg via INTRAVENOUS
  Filled 2021-05-08: qty 1

## 2021-05-08 MED ORDER — SODIUM CHLORIDE 0.9 % IV SOLN: INTRAVENOUS | Status: DC

## 2021-05-08 MED ORDER — HYDROCODONE-ACETAMINOPHEN 5-325 MG PO TABS: 1.0000 | ORAL_TABLET | Freq: Four times a day (QID) | ORAL | 0 refills | Status: AC | PRN

## 2021-05-08 NOTE — ED Triage Notes (Signed)
Patient coming from home, complaint of dialysis access that is very painful, endorses increased swelling that began yesterday. VSS.  ?

## 2021-05-08 NOTE — ED Provider Notes (Addendum)
?[facility] ?Provider Note ? ? ?CSN: [id number] ?Arrival date & time: 05/08/21  1050 ? ?  ? ?History ? ?Chief Complaint  ?Patient presents with  ? Vascular Access Problem  ? ? ?Amy Camacho is a 26 y.o. female. ? ?Patient c/o pain to left upper extremity dialysis graft surgical site - patient is pod #3 s/p placement of gore-tex graft. Pain constant, dull, moderate, with moderate swelling to left arm esp just proximal to elbow. Notes scant sanguinous drainage from distal/radial surgical site - no severe pain to incision(s). No fever or chills.   Notes hand feels intermittently tingling at times. Is able to move fingers. Called vascular surgery and was advised to come to ED. ? ?The history is provided by the patient, a parent and medical records.  ? ?  ? ?Home Medications ?Prior to Admission medications   ?Medication Sig Start Date End Date Taking? Authorizing Provider  ?acetaminophen (TYLENOL) 500 MG tablet Take 1,000 mg by mouth every 6 (six) hours as needed for moderate pain.    [provider]  ?amitriptyline (ELAVIL) 50 MG tablet Take 50 mg by mouth at bedtime as needed for sleep. 02/13/21   [provider]  ?b complex-vitamin c-folic acid (NEPHRO-VITE) 0.8 MG TABS tablet Take 1 tablet by mouth daily. 02/13/21   [provider]  ?Buprenorphine HCl-Naloxone HCl 8-2 MG FILM Place 0.5 Film under the tongue 4 (four) times daily as needed (pain). 04/08/21   [provider]  ?midodrine (PROAMATINE) 5 MG tablet Take 2.5 mg by mouth Every Tuesday,Thursday,and Saturday with dialysis. 04/08/21   [provider]  ?naloxone (NARCAN) nasal spray 4 mg/0.1 mL Place 1 spray into the nose once.    [provider]  ?sodium thiosulfate 250 MG/ML injection Inject 25 g into the vein Every Tuesday,Thursday,and Saturday with dialysis. 02/13/21   [provider]  ?vitamin k 100 MCG tablet Take 200 mcg by mouth daily. 02/13/21   [provider]  ?   ? ?Allergies    ?Icodextrin and Succinylcholine   ? ?Review of Systems   ?Review of Systems  ?Constitutional:  Negative for chills and fever.  ?Respiratory:  Negative for shortness of breath.   ?Cardiovascular:  Negative for chest pain.  ?Gastrointestinal:  Negative for vomiting.  ?Musculoskeletal:   ?     Pain left arm  ?Neurological:  Positive for numbness.  ? ?Physical Exam ?Updated Vital Signs ?BP (!) 139/95 (BP Location: Right Arm)   Pulse (!) 105   Temp 98.7 ?F (37.1 ?C) (Oral)   Resp 20   LMP  (LMP Unknown)   SpO2 100%  ?Physical Exam ?Vitals and nursing note reviewed.  ?Constitutional:   ?   Appearance: Normal appearance. She is well-developed.  ?HENT:  ?   Head: Atraumatic.  ?   Nose: Nose normal.  ?   Mouth/Throat:  ?   Mouth: Mucous membranes are moist.  ?Eyes:  ?   General: No scleral icterus. ?   Conjunctiva/sclera: Conjunctivae normal.  ?Neck:  ?   Trachea: No tracheal deviation.  ?Cardiovascular:  ?   Rate and Rhythm: Normal rate.  ?   Comments: Weak left radial pulse. +intact cap refill in digits. Surgical sites to wrist and proximal forearm appear fine/intact, without purulent drainage. No crepitus. Faint thrill felt over graft. There is moderate swelling around graft with mild increased warmth and tenderness. No crepitus. Compartments of arm and forearm and soft, not tense, not woody.  ?  Pulmonary:  ?   Effort: Pulmonary effort is normal. No respiratory distress.  ?   Breath sounds: Normal breath sounds.  ?Abdominal:  ?   General: Bowel sounds are normal. There is no distension.  ?   Palpations: Abdomen is soft.  ?   Tenderness: There is no abdominal tenderness. There is no guarding.  ?Genitourinary: ?   Comments: No cva tenderness.  ?Musculoskeletal:     ?   General: No swelling.  ?   Cervical back: Neck supple. No muscular tenderness.  ?Skin: ?   General: Skin is warm and dry.  ?   Findings: No rash.  ?Neurological:  ?   Mental Status: She is alert.  ?   Comments: Alert,  speech normal. LUE motor/sens grossly intact.   ?Psychiatric:     ?   Mood and Affect: Mood normal.  ? ? ?ED Results / Procedures / Treatments   ?Labs ?(all labs ordered are listed, but only abnormal results are displayed) ?Results for orders placed or performed during the hospital encounter of 05/08/21  ?CBC with Differential  ?Result Value Ref Range  ? WBC 7.0 4.0 - 10.5 K/uL  ? RBC 2.98 (L) 3.87 - 5.11 MIL/uL  ? Hemoglobin 7.8 (L) 12.0 - 15.0 g/dL  ? HCT 26.1 (L) 36.0 - 46.0 %  ? MCV 87.6 80.0 - 100.0 fL  ? MCH 26.2 26.0 - 34.0 pg  ? MCHC 29.9 (L) 30.0 - 36.0 g/dL  ? RDW 18.1 (H) 11.5 - 15.5 %  ? Platelets 214 150 - 400 K/uL  ? nRBC 0.0 0.0 - 0.2 %  ? Neutrophils Relative % 56 %  ? Neutro Abs 3.9 1.7 - 7.7 K/uL  ? Lymphocytes Relative 28 %  ? Lymphs Abs 1.9 0.7 - 4.0 K/uL  ? Monocytes Relative 11 %  ? Monocytes Absolute 0.8 0.1 - 1.0 K/uL  ? Eosinophils Relative 5 %  ? Eosinophils Absolute 0.3 0.0 - 0.5 K/uL  ? Basophils Relative 0 %  ? Basophils Absolute 0.0 0.0 - 0.1 K/uL  ? Immature Granulocytes 0 %  ? Abs Immature Granulocytes 0.02 0.00 - 0.07 K/uL  ?Basic metabolic panel  ?Result Value Ref Range  ? Sodium 138 135 - 145 mmol/L  ? Potassium 4.7 3.5 - 5.1 mmol/L  ? Chloride 96 (L) 98 - 111 mmol/L  ? CO2 24 22 - 32 mmol/L  ? Glucose, Bld 88 70 - 99 mg/dL  ? BUN 39 (H) 6 - 20 mg/dL  ? Creatinine, Ser 5.80 (H) 0.44 - 1.00 mg/dL  ? Calcium 8.4 (L) 8.9 - 10.3 mg/dL  ? GFR, Estimated 10 (L) >60 mL/min  ? Anion gap 18 (H) 5 - 15  ? ? ? ?EKG ?None ? ?Radiology ?No results found. ? ?Procedures ?Procedures  ? ? ?Medications Ordered in ED ?Medications  ?0.9 %  sodium chloride infusion (has no administration in time range)  ?HYDROmorphone (DILAUDID) injection 0.5 mg (has no administration in time range)  ? ? ?ED Course/ Medical Decision Making/ A&P ?  ?                        ?Medical Decision Making ?Problems Addressed: ?Chronic anemia: chronic illness or injury that poses a threat to life or bodily functions ?CKD  (chronic kidney disease) stage 5, GFR less than 15 ml/min (HCC): chronic illness or injury with exacerbation, progression, or side effects of treatment that poses a threat to life or bodily  functions ?Complications due to renal dialysis device, implant, and graft, initial encounter: acute illness or injury with systemic symptoms ?Steal syndrome as complication of dialysis access, initial encounter [facility]): acute illness or injury that poses a threat to life or bodily functions ? ?Amount and/or Complexity of Data Reviewed ?Independent Historian: parent ?   Details: additional hx ?External Data Reviewed: notes. ?Labs: ordered. Decision-making details documented in ED Course. ?Discussion of management or test interpretation with external provider(s): Vascular surgery consulted, discussed pt ? ?Risk ?Prescription drug management. ?Parenteral controlled substances. ?Decision regarding hospitalization. ? ? ?Iv ns. Dilaudid iv. Labs sent. Vascular surgery consulted - will see in ED. Disposition decision including potential need for admission and/or surgery considered - will await vascular eval.  ? ?Reviewed nursing notes and prior charts for additional history. Additional hx from chart/notes, and parent.  ? ?Labs reviewed/interpreted by me - k normal. Wbc normal.  ? ?Diff dx considered including steal syndrome, post surgical pain/swelling, early infection, etc. And considered possible need for admission and/or surgical procedure - will get vascular consult.  ? ?Cardiac monitor: sinus rhythm, rate 94.  ? ?Vascular re-consulted - await call back. ? ?Discussed pt with Dr Carlis Abbott - he will come to ED to see patient.  ? ?Recheck, pain improved, no new c/o.  ? ?Vascular, Dr Carlis Abbott has seen, he indicates he has arranged office f/u this Wednesday, and has given rx for pain, and indicates d/c to home.  ? ? ? ? ? ? ? ?Final Clinical Impression(s) / ED Diagnoses ?Final diagnoses:  ?None  ? ? ?Rx / DC Orders ?ED Discharge Orders   ? ? None   ? ?  ? ? ? ?  ?Lajean Saver, MD ?05/08/21 1322 ? ?

## 2021-05-08 NOTE — ED Provider Triage Note (Signed)
Emergency Medicine Provider Triage Evaluation Note ? ?Amy Camacho , a 26 y.o. female  was evaluated in triage.  Pt complains of LUE edema and pain that started yesterday. Notes left hand is cold compared to right. Recently has vascular access placed on 3/7 by Dr. Sherren Mocha. No fever or chills.  ? ?Review of Systems  ?Positive: Arthralgia, edema ?Negative: fever ? ?Physical Exam  ?LMP  (LMP Unknown)  ?Gen:   Awake, no distress   ?Resp:  Normal effort  ?MSK:   Moves extremities without difficulty  ?Other:  LUE edema, cold left hand, radial pulse found with doppler in triage ? ?Medical Decision Making  ?Medically screening exam initiated at 10:55 AM.  Appropriate orders placed.  Amy Camacho was informed that the remainder of the evaluation will be completed by another provider, this initial triage assessment does not replace that evaluation, and the importance of remaining in the ED until their evaluation is complete. ? ?Routine labs ?[place] to rule out DVT ?  ?Suzy Bouchard, PA-C ?05/08/21 1108 ? ?

## 2021-05-08 NOTE — Telephone Encounter (Signed)
26 year old female that had left arm AV graft on Wednesday with Dr. Donnetta Hutching in [facility] [place].  Mom called this morning stating that she has a swollen left arm with a numb hand that is not working.  Sounds like she has a fairly profound motor/sensory deficit in the left hand concerning for profound steal syndrome.  I advised that she remain n.p.o. and come to the [facility] ED.  I will see her in consultation. ? ?Marty Heck, MD ?[facility] ?Office: [phone] ? ? ?Marty Heck ? ? ?

## 2021-05-08 NOTE — Consult Note (Signed)
Hospital Consult    Reason for Consult: Left arm swelling and pain in hand with steal syndrome Referring Physician: ED MRN #:  [id number]  History of Present Illness: This is a 26 y.o. 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  She had a left upper arm AV graft on Wednesday by Dr. Donnetta Hutching at [facility].  Her mother called this morning on the triage line given concern for swelling as well as weakness and numbness in the left hand.  We instructed her to come to the ED for further evaluation.  On evaluation, patient states that her grip strength has improved since yesterday.  She still has some numbness in the hand mostly in the thumb that is also improving.  She is also concerned about the swelling in her left arm.  She is currently using a left IJ tunneled catheter for dialysis.  Past Medical History:  Diagnosis Date   Anemia    CKD (chronic kidney disease) requiring chronic dialysis [facility])     Past Surgical History:  Procedure Laterality Date   AV FISTULA PLACEMENT Left 05/05/2021   Procedure: LEFT ARM  ARTERIOVENOUS GRAFT PLACEMENT;  Surgeon: Rosetta Posner, MD;  Location: [facility];  Service: Vascular;  Laterality: Left;   DIALYSIS/PERMA CATHETER INSERTION     HERNIA REPAIR      Allergies  Allergen Reactions   Icodextrin Other (See Comments)    unknown unknown    Succinylcholine Other (See Comments)    Patient received succinylcholine on 02/12/21. No twitches on ulnar nerve until 26 minutes later, and unable to extubate due to low tidal volumes until 50 minutes after administration. No other neuromuscular blocking drugs or opioids were adminstered. 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 Patient received succinylcholine on 02/12/21. No twitches on ulnar nerve until 26 minutes later, and unable to extubate due to low tidal volumes until 50 minutes after administration. No  other neuromuscular blocking drugs or opioids were adminstered. 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     Prior to Admission medications   Medication Sig Start Date End Date Taking? Authorizing Provider  HYDROcodone-acetaminophen (NORCO) 5-325 MG tablet Take 1 tablet by mouth every 6 (six) hours as needed for moderate pain. 05/08/21  Yes Rhyne, Hulen Shouts, PA-C  acetaminophen (TYLENOL) 500 MG tablet Take 1,000 mg by mouth every 6 (six) hours as needed for moderate pain.    [provider]  amitriptyline (ELAVIL) 50 MG tablet Take 50 mg by mouth at bedtime as needed for sleep. 02/13/21   [provider]  b complex-vitamin c-folic acid (NEPHRO-VITE) 0.8 MG TABS tablet Take 1 tablet by mouth daily. 02/13/21   [provider]  Buprenorphine HCl-Naloxone HCl 8-2 MG FILM Place 0.5 Film under the tongue 4 (four) times daily as needed (pain). 04/08/21   [provider]  midodrine (PROAMATINE) 5 MG tablet Take 2.5 mg by mouth Every Tuesday,Thursday,and Saturday with dialysis. 04/08/21   [provider]  naloxone [facility]) nasal spray 4 mg/0.1 mL Place 1 spray into the nose once.    [provider]  sodium thiosulfate 250 MG/ML injection Inject 25 g into the vein Every Tuesday,Thursday,and Saturday with dialysis. 02/13/21   [provider]  vitamin k 100 MCG tablet Take 200 mcg by mouth daily. 02/13/21   [provider]    Social History   Socioeconomic History   Marital status: Single  Spouse name: Not on file   Number of children: Not on file   Years of education: Not on file   Highest education level: Not on file  Occupational History   Not on file  Tobacco Use   Smoking status: Every Day    Packs/day: 0.50    Years: 3.00    Pack years: 1.50    Types: Cigarettes   Smokeless tobacco: Never  Vaping Use   Vaping Use: Never used  Substance and Sexual Activity   Alcohol use: Never   Drug use: Never    Sexual activity: Not on file  Other Topics Concern   Not on file  Social History Narrative   Not on file   Social Determinants of Health   Financial Resource Strain: Not on file  Food Insecurity: Not on file  Transportation Needs: Not on file  Physical Activity: Not on file  Stress: Not on file  Social Connections: Not on file  Intimate Partner Violence: Not on file    No family history on file.  ROS: '[x]'$  Positive   '[ ]'$  Negative   '[ ]'$  All sytems reviewed and are negative  Cardiovascular: '[]'$  chest pain/pressure '[]'$  palpitations '[]'$  SOB lying flat '[]'$  DOE '[]'$  pain in legs while walking '[]'$  pain in legs at rest '[]'$  pain in legs at night '[]'$  non-healing ulcers '[]'$  hx of DVT '[]'$  swelling in legs X - left arm swelling Pulmonary: '[]'$  productive cough '[]'$  asthma/wheezing '[]'$  home O2  Neurologic: '[]'$  weakness in '[]'$  arms '[]'$  legs '[x]'$  numbness in '[x]'$  arms '[]'$  legs - left hand '[]'$  hx of CVA '[]'$  mini stroke '[]'$ difficulty speaking or slurred speech '[]'$  temporary loss of vision in one eye '[]'$  dizziness  Hematologic: '[]'$  hx of cancer '[]'$  bleeding problems '[]'$  problems with blood clotting easily  Endocrine:   '[]'$  diabetes '[]'$  thyroid disease  GI '[]'$  vomiting blood '[]'$  blood in stool  GU: '[]'$  CKD/renal failure '[]'$  HD--'[]'$  M/W/F or '[]'$  T/T/S '[]'$  burning with urination '[]'$  blood in urine  Psychiatric: '[]'$  anxiety '[]'$  depression  Musculoskeletal: '[]'$  arthritis '[]'$  joint pain  Integumentary: '[]'$  rashes '[]'$  ulcers  Constitutional: '[]'$  fever '[]'$  chills   Physical Examination  Vitals:   05/08/21 1200 05/08/21 1300  BP: (!) 139/97 (!) 144/97  Pulse: 93 93  Resp: 20 (!) 22  Temp:    SpO2: 100% 100%   There is no height or weight on file to calculate BMI.  General:  WDWN in NAD Gait: Not observed HENT: WNL, normocephalic Pulmonary: normal non-labored breathing Cardiac: regular, without  Murmurs, rubs or gallops Abdomen:  soft, NT/ND Vascular Exam/Pulses: Left upper arm AV graft with good  thrill Left wrist and antecubital incision clean and dry with no hematoma Notable left upper extremity swelling Left hand grip strength appears near 5 out of 5 Extremities: without ischemic changes,uscle wasting or atrophy  Neurologic: A&O X 3; Appropriate Affect ; SENSATION: Decreased in left hand MOTOR FUNCTION:  moving all extremities equally. Speech is fluent/normal   CBC    Component Value Date/Time   WBC 7.0 05/08/2021 1158   RBC 2.98 (L) 05/08/2021 1158   HGB 7.8 (L) 05/08/2021 1158   HCT 26.1 (L) 05/08/2021 1158   PLT 214 05/08/2021 1158   MCV 87.6 05/08/2021 1158   MCH 26.2 05/08/2021 1158   MCHC 29.9 (L) 05/08/2021 1158   RDW 18.1 (H) 05/08/2021 1158   LYMPHSABS 1.9 05/08/2021 1158   MONOABS 0.8 05/08/2021 1158   EOSABS 0.3  05/08/2021 1158   BASOSABS 0.0 05/08/2021 1158    BMET    Component Value Date/Time   NA 138 05/08/2021 1158   K 4.7 05/08/2021 1158   CL 96 (L) 05/08/2021 1158   CO2 24 05/08/2021 1158   GLUCOSE 88 05/08/2021 1158   BUN 39 (H) 05/08/2021 1158   CREATININE 5.80 (H) 05/08/2021 1158   CALCIUM 8.4 (L) 05/08/2021 1158   GFRNONAA 10 (L) 05/08/2021 1158    COAGS: No results found for: INR, PROTIME   Non-Invasive Vascular Imaging:    None   ASSESSMENT/PLAN: This is a 26 y.o. female with history of end-stage renal disease who underwent left upper arm AV graft placement on Wednesday, 05/05/2021 by my partner Dr. Donnetta Hutching at [facility].  She presents to the ED with concern for arm swelling and steal syndrome.  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  All of her incisions are intact.  My bigger concern was what sounded like fairly severe steal syndrome in the left hand when her mom called this morning.  On exam she actually has excellent grip strength and appears to be grossly motor intact.  She states this is much better than yesterday.  She does  have numbness in the hand more in the thumb.  I can appreciate a 1+ radial pulse on exam.  I discussed steal syndrome in detail with the patient and her mother at bedside.  Discussed that if she had advanced motor deficit, I would take her to the OR today and remove her graft to improve flow to her hand.  Given her reassuring and improving exam, I am comfortable observing this for now.  We did wrap her arm with an Ace to help with the swelling and instructed her to elevate this at home.  I asked that she call us over the weekend if this gets worse particularly if she has any advancing symptoms.  I will have her see Dr. Donnetta Hutching next Wednesday for a clinic visit in [place] to ensure that her exam is stable and/or improving.  Marty Heck, MD [facility] [place] Office: [place]

## 2021-05-08 NOTE — Discharge Instructions (Signed)
It was our pleasure to provide your ER care today - we hope that you feel better. ? ?Follow up with vascular/Dr Early as arranged this coming Wednesday. Take pain medication as need.  ? ?Return to ER if worse, new symptoms, fevers, worsening, severe or intractable pain, increased swelling/spreading redness, numbness/weakness, pus from wounds, or other concern.  ? ?You were given pain meds in the ER - no driving for the next 6 hours, or when taking opiate type pain meds.  ? ?

## 2021-05-09 DIAGNOSIS — N2581 Secondary hyperparathyroidism of renal origin: Secondary | ICD-10-CM | POA: Diagnosis not present

## 2021-05-09 DIAGNOSIS — D631 Anemia in chronic kidney disease: Secondary | ICD-10-CM | POA: Diagnosis not present

## 2021-05-09 DIAGNOSIS — Z992 Dependence on renal dialysis: Secondary | ICD-10-CM | POA: Diagnosis not present

## 2021-05-09 DIAGNOSIS — N186 End stage renal disease: Secondary | ICD-10-CM | POA: Diagnosis not present

## 2021-05-09 DIAGNOSIS — D509 Iron deficiency anemia, unspecified: Secondary | ICD-10-CM | POA: Diagnosis not present

## 2021-05-12 DIAGNOSIS — N186 End stage renal disease: Secondary | ICD-10-CM | POA: Diagnosis not present

## 2021-05-12 DIAGNOSIS — N2581 Secondary hyperparathyroidism of renal origin: Secondary | ICD-10-CM | POA: Diagnosis not present

## 2021-05-12 DIAGNOSIS — Z992 Dependence on renal dialysis: Secondary | ICD-10-CM | POA: Diagnosis not present

## 2021-05-12 DIAGNOSIS — D509 Iron deficiency anemia, unspecified: Secondary | ICD-10-CM | POA: Diagnosis not present

## 2021-05-12 DIAGNOSIS — D631 Anemia in chronic kidney disease: Secondary | ICD-10-CM | POA: Diagnosis not present

## 2021-05-13 ENCOUNTER — Encounter: Payer: Medicare Other | Admitting: Vascular Surgery

## 2021-05-14 DIAGNOSIS — N186 End stage renal disease: Secondary | ICD-10-CM | POA: Diagnosis not present

## 2021-05-14 DIAGNOSIS — Z992 Dependence on renal dialysis: Secondary | ICD-10-CM | POA: Diagnosis not present

## 2021-05-14 DIAGNOSIS — N2581 Secondary hyperparathyroidism of renal origin: Secondary | ICD-10-CM | POA: Diagnosis not present

## 2021-05-14 DIAGNOSIS — D631 Anemia in chronic kidney disease: Secondary | ICD-10-CM | POA: Diagnosis not present

## 2021-05-14 DIAGNOSIS — D509 Iron deficiency anemia, unspecified: Secondary | ICD-10-CM | POA: Diagnosis not present

## 2021-05-16 DIAGNOSIS — N2581 Secondary hyperparathyroidism of renal origin: Secondary | ICD-10-CM | POA: Diagnosis not present

## 2021-05-16 DIAGNOSIS — N186 End stage renal disease: Secondary | ICD-10-CM | POA: Diagnosis not present

## 2021-05-16 DIAGNOSIS — D631 Anemia in chronic kidney disease: Secondary | ICD-10-CM | POA: Diagnosis not present

## 2021-05-16 DIAGNOSIS — D509 Iron deficiency anemia, unspecified: Secondary | ICD-10-CM | POA: Diagnosis not present

## 2021-05-16 DIAGNOSIS — Z992 Dependence on renal dialysis: Secondary | ICD-10-CM | POA: Diagnosis not present

## 2021-05-19 DIAGNOSIS — N2581 Secondary hyperparathyroidism of renal origin: Secondary | ICD-10-CM | POA: Diagnosis not present

## 2021-05-19 DIAGNOSIS — D631 Anemia in chronic kidney disease: Secondary | ICD-10-CM | POA: Diagnosis not present

## 2021-05-19 DIAGNOSIS — D509 Iron deficiency anemia, unspecified: Secondary | ICD-10-CM | POA: Diagnosis not present

## 2021-05-19 DIAGNOSIS — Z992 Dependence on renal dialysis: Secondary | ICD-10-CM | POA: Diagnosis not present

## 2021-05-19 DIAGNOSIS — N186 End stage renal disease: Secondary | ICD-10-CM | POA: Diagnosis not present

## 2021-05-21 DIAGNOSIS — N2581 Secondary hyperparathyroidism of renal origin: Secondary | ICD-10-CM | POA: Diagnosis not present

## 2021-05-21 DIAGNOSIS — D509 Iron deficiency anemia, unspecified: Secondary | ICD-10-CM | POA: Diagnosis not present

## 2021-05-21 DIAGNOSIS — Z992 Dependence on renal dialysis: Secondary | ICD-10-CM | POA: Diagnosis not present

## 2021-05-21 DIAGNOSIS — D631 Anemia in chronic kidney disease: Secondary | ICD-10-CM | POA: Diagnosis not present

## 2021-05-21 DIAGNOSIS — N186 End stage renal disease: Secondary | ICD-10-CM | POA: Diagnosis not present

## 2021-05-23 DIAGNOSIS — Z992 Dependence on renal dialysis: Secondary | ICD-10-CM | POA: Diagnosis not present

## 2021-05-23 DIAGNOSIS — N2581 Secondary hyperparathyroidism of renal origin: Secondary | ICD-10-CM | POA: Diagnosis not present

## 2021-05-23 DIAGNOSIS — N186 End stage renal disease: Secondary | ICD-10-CM | POA: Diagnosis not present

## 2021-05-23 DIAGNOSIS — D509 Iron deficiency anemia, unspecified: Secondary | ICD-10-CM | POA: Diagnosis not present

## 2021-05-23 DIAGNOSIS — D631 Anemia in chronic kidney disease: Secondary | ICD-10-CM | POA: Diagnosis not present

## 2021-05-26 DIAGNOSIS — N2581 Secondary hyperparathyroidism of renal origin: Secondary | ICD-10-CM | POA: Diagnosis not present

## 2021-05-26 DIAGNOSIS — Z992 Dependence on renal dialysis: Secondary | ICD-10-CM | POA: Diagnosis not present

## 2021-05-26 DIAGNOSIS — D631 Anemia in chronic kidney disease: Secondary | ICD-10-CM | POA: Diagnosis not present

## 2021-05-26 DIAGNOSIS — N186 End stage renal disease: Secondary | ICD-10-CM | POA: Diagnosis not present

## 2021-05-26 DIAGNOSIS — D509 Iron deficiency anemia, unspecified: Secondary | ICD-10-CM | POA: Diagnosis not present

## 2021-05-28 DIAGNOSIS — D509 Iron deficiency anemia, unspecified: Secondary | ICD-10-CM | POA: Diagnosis not present

## 2021-05-28 DIAGNOSIS — N2581 Secondary hyperparathyroidism of renal origin: Secondary | ICD-10-CM | POA: Diagnosis not present

## 2021-05-28 DIAGNOSIS — D631 Anemia in chronic kidney disease: Secondary | ICD-10-CM | POA: Diagnosis not present

## 2021-05-28 DIAGNOSIS — N186 End stage renal disease: Secondary | ICD-10-CM | POA: Diagnosis not present

## 2021-05-28 DIAGNOSIS — Z992 Dependence on renal dialysis: Secondary | ICD-10-CM | POA: Diagnosis not present

## 2021-05-29 DIAGNOSIS — N186 End stage renal disease: Secondary | ICD-10-CM | POA: Diagnosis not present

## 2021-05-29 DIAGNOSIS — Z992 Dependence on renal dialysis: Secondary | ICD-10-CM | POA: Diagnosis not present

## 2021-05-30 DIAGNOSIS — Z992 Dependence on renal dialysis: Secondary | ICD-10-CM | POA: Diagnosis not present

## 2021-05-30 DIAGNOSIS — D509 Iron deficiency anemia, unspecified: Secondary | ICD-10-CM | POA: Diagnosis not present

## 2021-05-30 DIAGNOSIS — N186 End stage renal disease: Secondary | ICD-10-CM | POA: Diagnosis not present

## 2021-06-02 DIAGNOSIS — Z992 Dependence on renal dialysis: Secondary | ICD-10-CM | POA: Diagnosis not present

## 2021-06-02 DIAGNOSIS — D509 Iron deficiency anemia, unspecified: Secondary | ICD-10-CM | POA: Diagnosis not present

## 2021-06-02 DIAGNOSIS — N186 End stage renal disease: Secondary | ICD-10-CM | POA: Diagnosis not present

## 2021-06-03 DIAGNOSIS — J0101 Acute recurrent maxillary sinusitis: Secondary | ICD-10-CM | POA: Diagnosis not present

## 2021-06-03 DIAGNOSIS — R590 Localized enlarged lymph nodes: Secondary | ICD-10-CM | POA: Diagnosis not present

## 2021-06-03 DIAGNOSIS — F17210 Nicotine dependence, cigarettes, uncomplicated: Secondary | ICD-10-CM | POA: Diagnosis not present

## 2021-06-03 DIAGNOSIS — I10 Essential (primary) hypertension: Secondary | ICD-10-CM | POA: Diagnosis not present

## 2021-06-03 DIAGNOSIS — Z6822 Body mass index (BMI) 22.0-22.9, adult: Secondary | ICD-10-CM | POA: Diagnosis not present

## 2021-06-04 DIAGNOSIS — Z992 Dependence on renal dialysis: Secondary | ICD-10-CM | POA: Diagnosis not present

## 2021-06-04 DIAGNOSIS — D509 Iron deficiency anemia, unspecified: Secondary | ICD-10-CM | POA: Diagnosis not present

## 2021-06-04 DIAGNOSIS — N186 End stage renal disease: Secondary | ICD-10-CM | POA: Diagnosis not present

## 2021-06-06 DIAGNOSIS — Z992 Dependence on renal dialysis: Secondary | ICD-10-CM | POA: Diagnosis not present

## 2021-06-06 DIAGNOSIS — D509 Iron deficiency anemia, unspecified: Secondary | ICD-10-CM | POA: Diagnosis not present

## 2021-06-06 DIAGNOSIS — N186 End stage renal disease: Secondary | ICD-10-CM | POA: Diagnosis not present

## 2021-06-08 DIAGNOSIS — R0603 Acute respiratory distress: Secondary | ICD-10-CM | POA: Diagnosis not present

## 2021-06-08 DIAGNOSIS — Z992 Dependence on renal dialysis: Secondary | ICD-10-CM | POA: Diagnosis not present

## 2021-06-08 DIAGNOSIS — J80 Acute respiratory distress syndrome: Secondary | ICD-10-CM | POA: Diagnosis not present

## 2021-06-08 DIAGNOSIS — R002 Palpitations: Secondary | ICD-10-CM | POA: Diagnosis not present

## 2021-06-08 DIAGNOSIS — N189 Chronic kidney disease, unspecified: Secondary | ICD-10-CM | POA: Diagnosis not present

## 2021-06-08 DIAGNOSIS — I129 Hypertensive chronic kidney disease with stage 1 through stage 4 chronic kidney disease, or unspecified chronic kidney disease: Secondary | ICD-10-CM | POA: Diagnosis not present

## 2021-06-08 DIAGNOSIS — E1122 Type 2 diabetes mellitus with diabetic chronic kidney disease: Secondary | ICD-10-CM | POA: Diagnosis not present

## 2021-06-08 DIAGNOSIS — I469 Cardiac arrest, cause unspecified: Secondary | ICD-10-CM | POA: Diagnosis not present

## 2021-06-08 DIAGNOSIS — R4182 Altered mental status, unspecified: Secondary | ICD-10-CM | POA: Diagnosis not present

## 2021-06-08 DIAGNOSIS — F17210 Nicotine dependence, cigarettes, uncomplicated: Secondary | ICD-10-CM | POA: Diagnosis not present

## 2021-06-08 DIAGNOSIS — K7290 Hepatic failure, unspecified without coma: Secondary | ICD-10-CM | POA: Diagnosis not present

## 2021-06-10 ENCOUNTER — Encounter: Payer: Medicare Other | Admitting: Vascular Surgery

## 2021-06-29 DEATH — deceased

## 2022-05-08 IMAGING — DX DG ABDOMEN ACUTE W/ 1V CHEST
3 series · 3 of 3 positions shown · non-contrast
Comparison: None.

CLINICAL DATA: Nausea, vomiting, abdominal pain, hyperglycemia

EXAM:
DG ABDOMEN ACUTE WITH 1 VIEW CHEST

[chest pa]
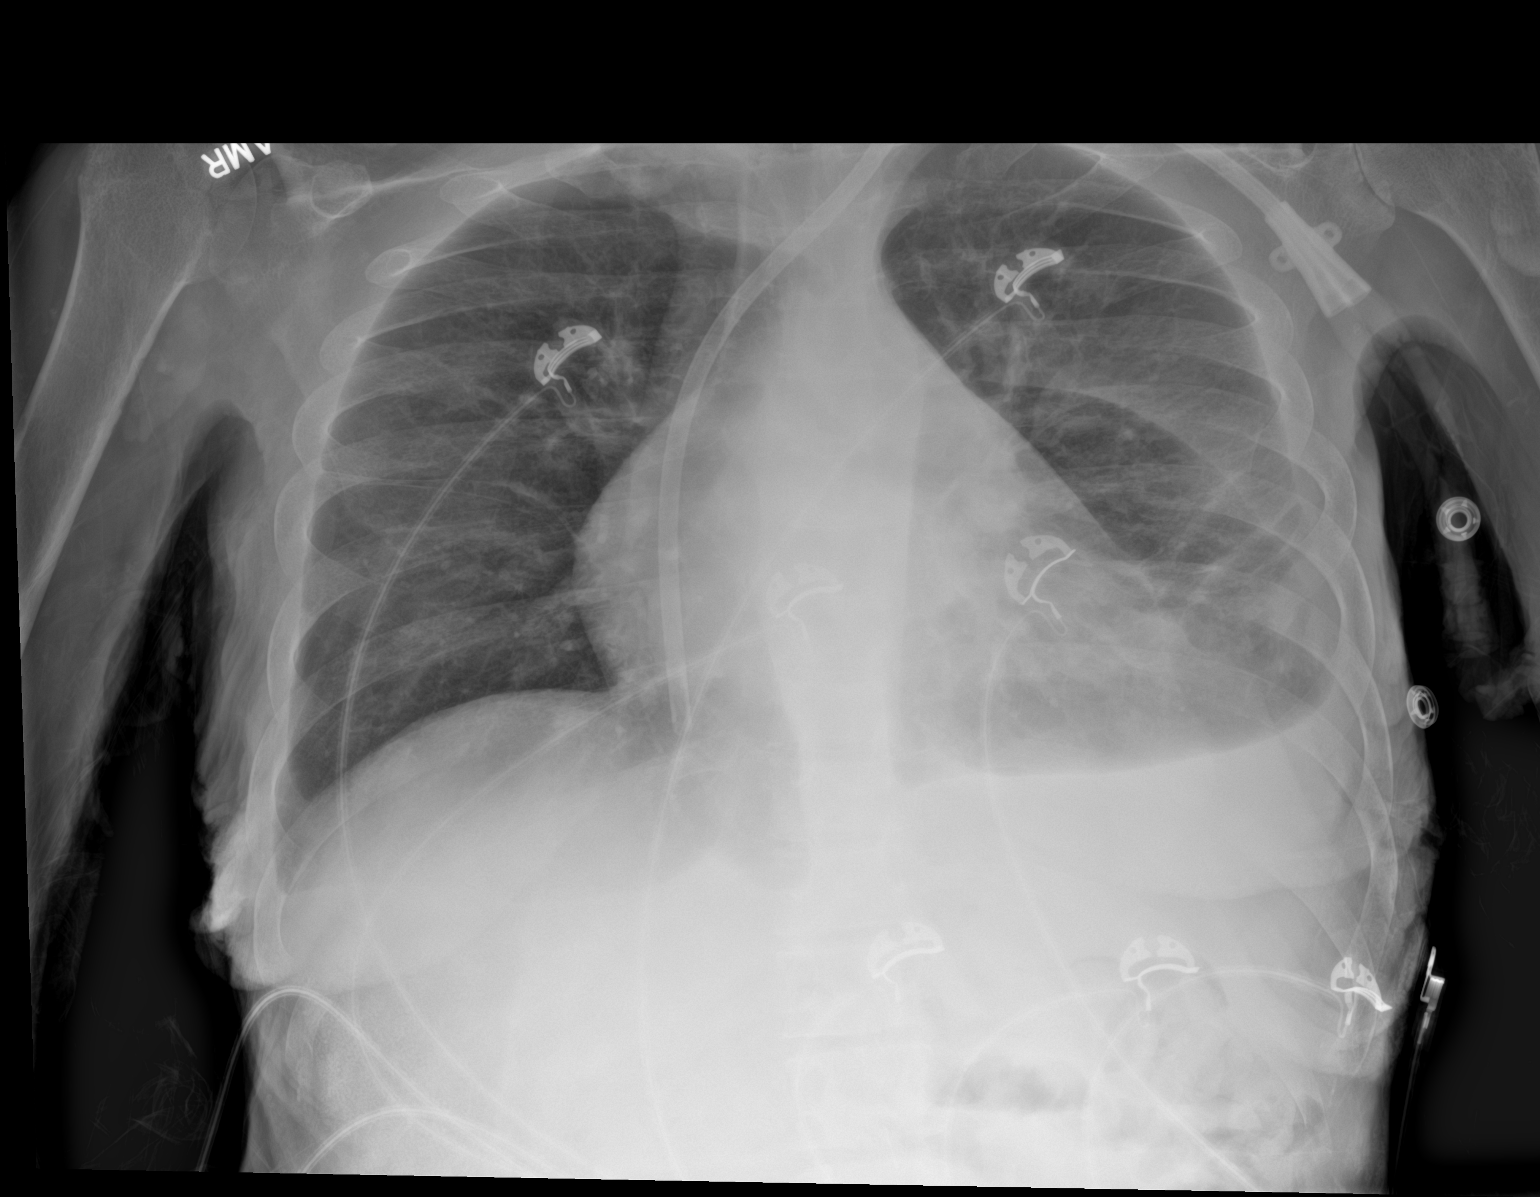

[abdomen erect ap]
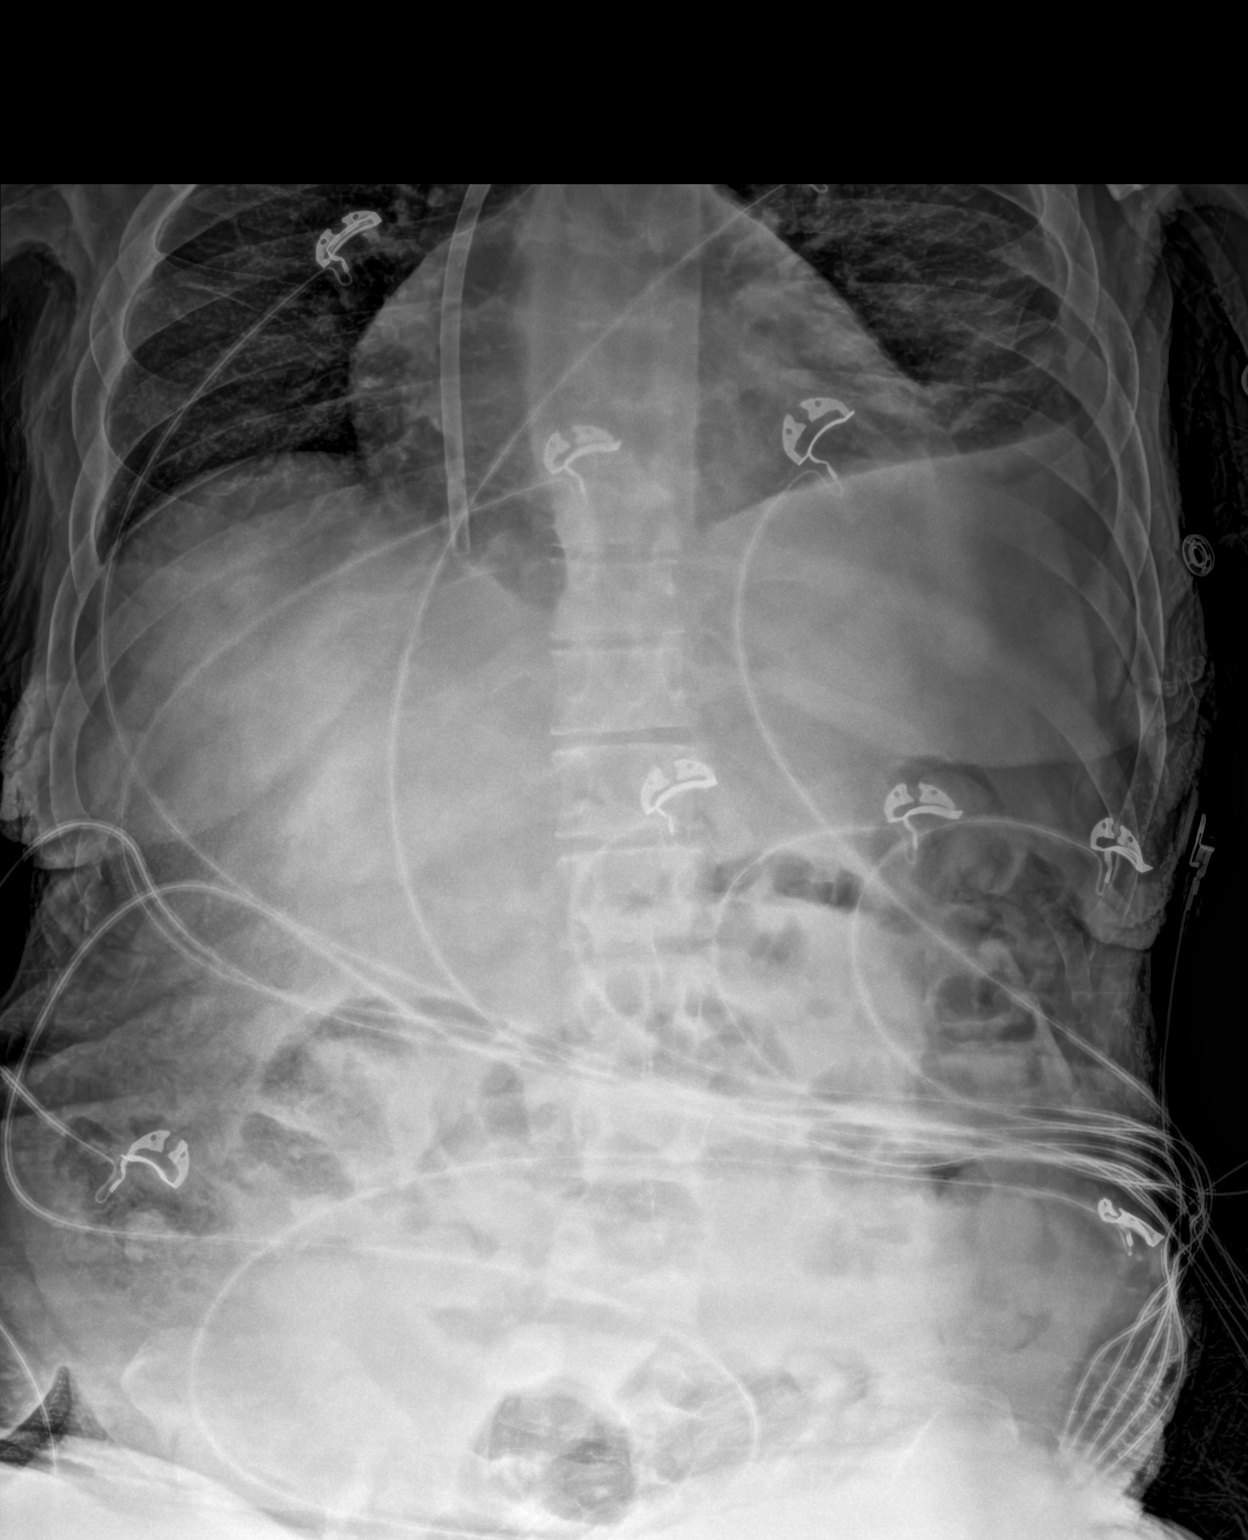

[abdomen supine]
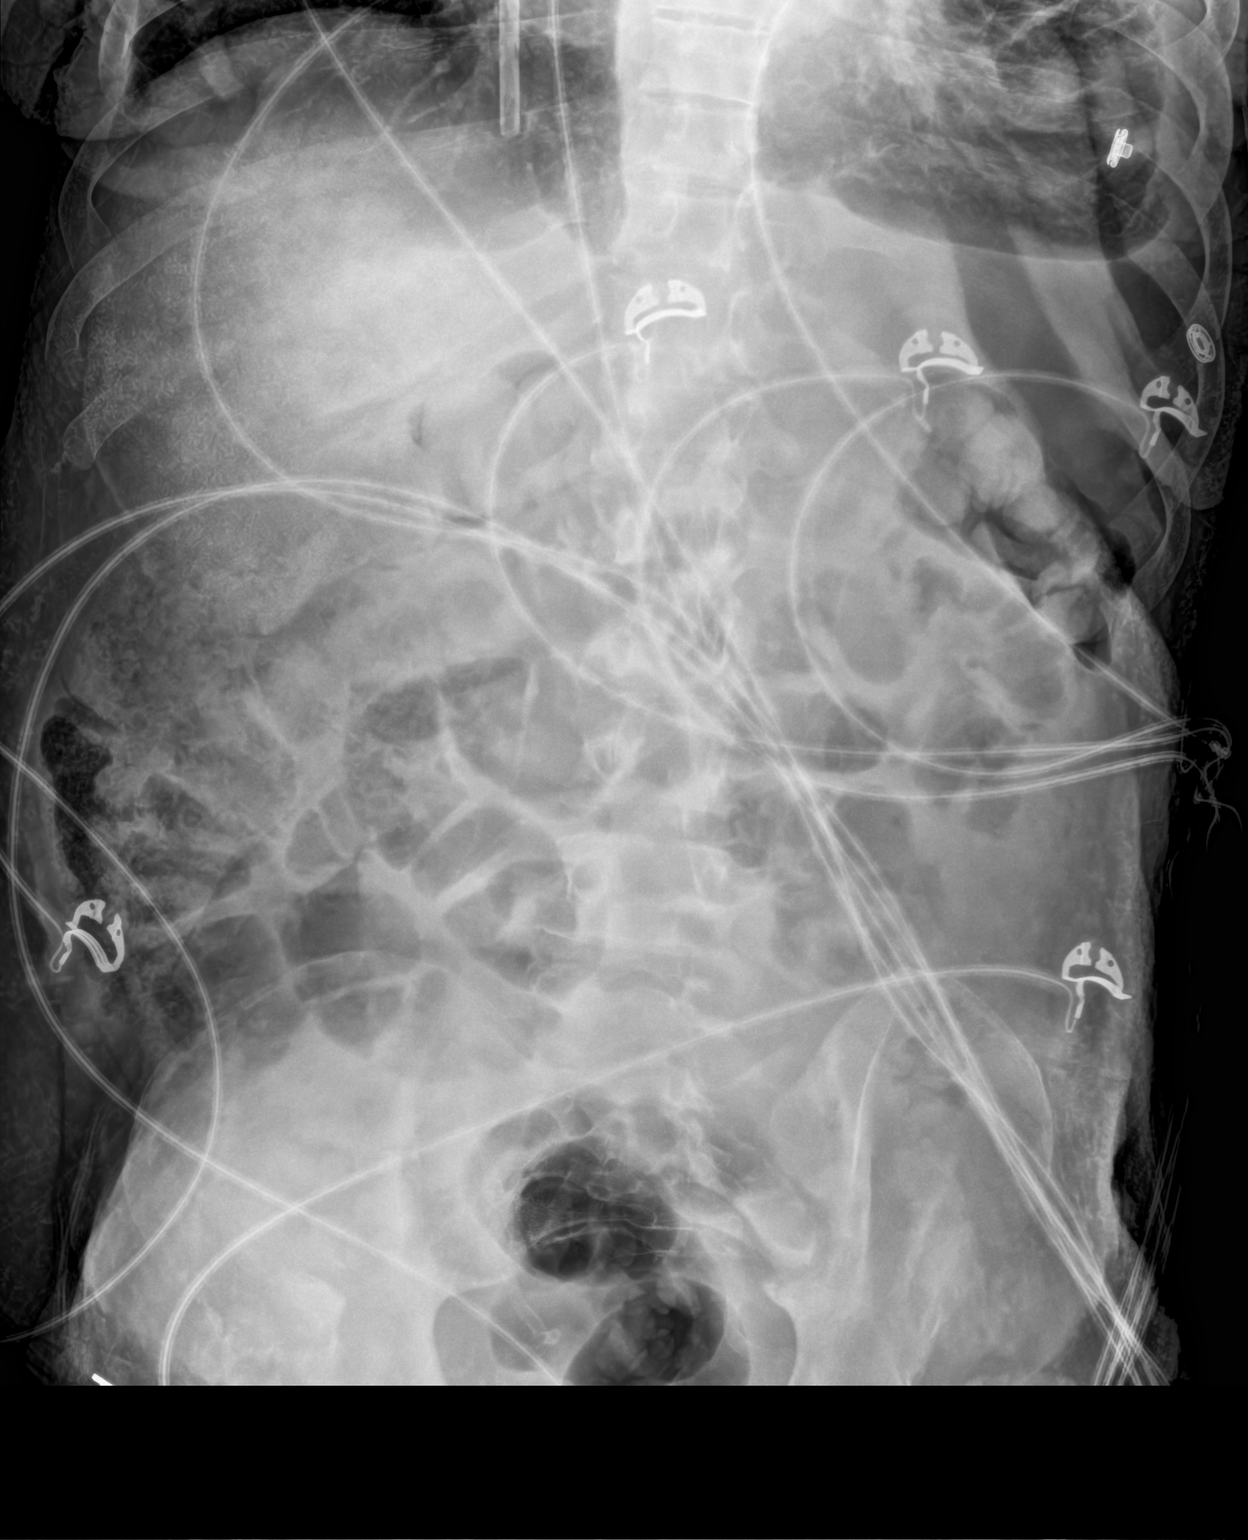

[3 of 3 positions shown; findings below may reference images not displayed]

FINDINGS: Chest: A left-sided dialysis catheter is in place terminating in the
right atrium. The heart is at the upper limits of normal for size.
The upper mediastinal contours are normal. There is a small left
pleural effusion with adjacent left basilar opacity. There is a
trace right effusion. There is no overt pulmonary edema. There is no
pneumothorax.

Irregularity of the medial right clavicle likely corresponds to the
finding on the prior CT chest from 03/16/2019, though those images
are not available for direct comparison.

Abdomen: There is gaseous distention of multiple loops of bowel
without evidence of mechanical obstruction. There is a moderate
stool burden primarily in the right hemiabdomen. There is no
evidence of free intraperitoneal air.
IMPRESSION: 1. Small left and trace right pleural effusions with adjacent
opacities in the left base.
2. Mild gaseous distention of multiple loops of bowel in the abdomen
without evidence of mechanical obstruction. Moderate stool burden in
the right hemiabdomen.
3. Irregularity of the medial right clavicle likely corresponds to
the destructive process described on the prior CT chest from
03/16/2019, though these images are not available for direct
comparison.
